# Patient Record
Sex: Female | Born: 1945 | Race: White | Hispanic: No | Smoking: Former smoker
Health system: Southern US, Community
[De-identification: ages and names within clinical notes are randomized; demographics above are authoritative.]

## PROBLEM LIST (undated history)

## (undated) DIAGNOSIS — Z9889 Other specified postprocedural states: Secondary | ICD-10-CM

## (undated) DIAGNOSIS — K648 Other hemorrhoids: Secondary | ICD-10-CM

## (undated) DIAGNOSIS — I493 Ventricular premature depolarization: Secondary | ICD-10-CM

## (undated) DIAGNOSIS — R112 Nausea with vomiting, unspecified: Secondary | ICD-10-CM

## (undated) DIAGNOSIS — H269 Unspecified cataract: Secondary | ICD-10-CM

## (undated) DIAGNOSIS — Z5189 Encounter for other specified aftercare: Secondary | ICD-10-CM

## (undated) DIAGNOSIS — I219 Acute myocardial infarction, unspecified: Secondary | ICD-10-CM

## (undated) DIAGNOSIS — I10 Essential (primary) hypertension: Secondary | ICD-10-CM

## (undated) DIAGNOSIS — Z8489 Family history of other specified conditions: Secondary | ICD-10-CM

## (undated) DIAGNOSIS — I2510 Atherosclerotic heart disease of native coronary artery without angina pectoris: Secondary | ICD-10-CM

## (undated) DIAGNOSIS — K5790 Diverticulosis of intestine, part unspecified, without perforation or abscess without bleeding: Secondary | ICD-10-CM

## (undated) DIAGNOSIS — R7611 Nonspecific reaction to tuberculin skin test without active tuberculosis: Secondary | ICD-10-CM

## (undated) DIAGNOSIS — K589 Irritable bowel syndrome without diarrhea: Secondary | ICD-10-CM

## (undated) DIAGNOSIS — K219 Gastro-esophageal reflux disease without esophagitis: Secondary | ICD-10-CM

## (undated) DIAGNOSIS — F419 Anxiety disorder, unspecified: Secondary | ICD-10-CM

## (undated) DIAGNOSIS — K644 Residual hemorrhoidal skin tags: Secondary | ICD-10-CM

## (undated) DIAGNOSIS — C50919 Malignant neoplasm of unspecified site of unspecified female breast: Secondary | ICD-10-CM

## (undated) DIAGNOSIS — D649 Anemia, unspecified: Secondary | ICD-10-CM

## (undated) DIAGNOSIS — A159 Respiratory tuberculosis unspecified: Secondary | ICD-10-CM

## (undated) DIAGNOSIS — K635 Polyp of colon: Secondary | ICD-10-CM

## (undated) DIAGNOSIS — M1990 Unspecified osteoarthritis, unspecified site: Secondary | ICD-10-CM

## (undated) HISTORY — DX: Anemia, unspecified: D64.9

## (undated) HISTORY — PX: OTHER SURGICAL HISTORY: SHX169

## (undated) HISTORY — DX: Malignant neoplasm of unspecified site of unspecified female breast: C50.919

## (undated) HISTORY — DX: Acute myocardial infarction, unspecified: I21.9

## (undated) HISTORY — DX: Ventricular premature depolarization: I49.3

## (undated) HISTORY — DX: Encounter for other specified aftercare: Z51.89

## (undated) HISTORY — DX: Residual hemorrhoidal skin tags: K64.4

## (undated) HISTORY — DX: Essential (primary) hypertension: I10

## (undated) HISTORY — DX: Atherosclerotic heart disease of native coronary artery without angina pectoris: I25.10

## (undated) HISTORY — PX: APPENDECTOMY: SHX54

## (undated) HISTORY — DX: Unspecified cataract: H26.9

## (undated) HISTORY — DX: Polyp of colon: K63.5

## (undated) HISTORY — DX: Anxiety disorder, unspecified: F41.9

## (undated) HISTORY — DX: Other hemorrhoids: K64.8

## (undated) HISTORY — DX: Diverticulosis of intestine, part unspecified, without perforation or abscess without bleeding: K57.90

## (undated) HISTORY — DX: Nonspecific reaction to tuberculin skin test without active tuberculosis: R76.11

---

## 1995-01-10 ENCOUNTER — Encounter: Payer: Self-pay | Admitting: Internal Medicine

## 1998-10-10 ENCOUNTER — Other Ambulatory Visit: Admission: RE | Admit: 1998-10-10 | Discharge: 1998-10-10 | Payer: Self-pay | Admitting: Obstetrics and Gynecology

## 1999-10-16 ENCOUNTER — Encounter (INDEPENDENT_AMBULATORY_CARE_PROVIDER_SITE_OTHER): Payer: Self-pay

## 1999-10-16 ENCOUNTER — Encounter: Payer: Self-pay | Admitting: Internal Medicine

## 1999-10-16 ENCOUNTER — Ambulatory Visit (HOSPITAL_COMMUNITY): Admission: RE | Admit: 1999-10-16 | Discharge: 1999-10-16 | Payer: Self-pay | Admitting: Gastroenterology

## 1999-10-16 DIAGNOSIS — K635 Polyp of colon: Secondary | ICD-10-CM

## 1999-10-16 HISTORY — DX: Polyp of colon: K63.5

## 1999-10-20 ENCOUNTER — Other Ambulatory Visit: Admission: RE | Admit: 1999-10-20 | Discharge: 1999-10-20 | Payer: Self-pay | Admitting: Obstetrics and Gynecology

## 2000-10-31 ENCOUNTER — Other Ambulatory Visit: Admission: RE | Admit: 2000-10-31 | Discharge: 2000-10-31 | Payer: Self-pay | Admitting: Obstetrics and Gynecology

## 2002-12-03 DIAGNOSIS — I219 Acute myocardial infarction, unspecified: Secondary | ICD-10-CM

## 2002-12-03 HISTORY — PX: CORONARY ANGIOPLASTY WITH STENT PLACEMENT: SHX49

## 2002-12-03 HISTORY — DX: Acute myocardial infarction, unspecified: I21.9

## 2003-07-25 ENCOUNTER — Encounter: Payer: Self-pay | Admitting: Emergency Medicine

## 2003-07-25 ENCOUNTER — Inpatient Hospital Stay (HOSPITAL_COMMUNITY): Admission: EM | Admit: 2003-07-25 | Discharge: 2003-07-28 | Payer: Self-pay | Admitting: Emergency Medicine

## 2003-08-09 ENCOUNTER — Encounter (HOSPITAL_COMMUNITY): Admission: RE | Admit: 2003-08-09 | Discharge: 2003-11-07 | Payer: Self-pay | Admitting: Cardiovascular Disease

## 2004-03-27 HISTORY — PX: US ECHOCARDIOGRAPHY: HXRAD669

## 2004-11-13 ENCOUNTER — Encounter: Payer: Self-pay | Admitting: Internal Medicine

## 2004-11-13 ENCOUNTER — Encounter (INDEPENDENT_AMBULATORY_CARE_PROVIDER_SITE_OTHER): Payer: Self-pay | Admitting: *Deleted

## 2004-11-13 ENCOUNTER — Ambulatory Visit (HOSPITAL_COMMUNITY): Admission: RE | Admit: 2004-11-13 | Discharge: 2004-11-13 | Payer: Self-pay | Admitting: Gastroenterology

## 2004-11-13 DIAGNOSIS — K644 Residual hemorrhoidal skin tags: Secondary | ICD-10-CM

## 2004-11-13 DIAGNOSIS — K5790 Diverticulosis of intestine, part unspecified, without perforation or abscess without bleeding: Secondary | ICD-10-CM

## 2004-11-13 HISTORY — DX: Residual hemorrhoidal skin tags: K64.4

## 2004-11-13 HISTORY — DX: Diverticulosis of intestine, part unspecified, without perforation or abscess without bleeding: K57.90

## 2005-05-21 ENCOUNTER — Ambulatory Visit (HOSPITAL_COMMUNITY): Admission: RE | Admit: 2005-05-21 | Discharge: 2005-05-21 | Payer: Self-pay | Admitting: General Surgery

## 2005-05-21 ENCOUNTER — Ambulatory Visit (HOSPITAL_BASED_OUTPATIENT_CLINIC_OR_DEPARTMENT_OTHER): Admission: RE | Admit: 2005-05-21 | Discharge: 2005-05-21 | Payer: Self-pay | Admitting: General Surgery

## 2005-05-21 ENCOUNTER — Encounter (INDEPENDENT_AMBULATORY_CARE_PROVIDER_SITE_OTHER): Payer: Self-pay | Admitting: *Deleted

## 2005-07-31 ENCOUNTER — Ambulatory Visit: Payer: Self-pay | Admitting: Internal Medicine

## 2005-08-02 ENCOUNTER — Ambulatory Visit: Payer: Self-pay | Admitting: Internal Medicine

## 2005-08-31 ENCOUNTER — Ambulatory Visit: Payer: Self-pay | Admitting: Internal Medicine

## 2005-09-03 ENCOUNTER — Ambulatory Visit: Payer: Self-pay | Admitting: Internal Medicine

## 2005-10-19 ENCOUNTER — Ambulatory Visit: Payer: Self-pay | Admitting: Internal Medicine

## 2005-12-10 ENCOUNTER — Other Ambulatory Visit: Admission: RE | Admit: 2005-12-10 | Discharge: 2005-12-10 | Payer: Self-pay | Admitting: Obstetrics and Gynecology

## 2006-07-01 HISTORY — PX: CARDIOVASCULAR STRESS TEST: SHX262

## 2006-08-28 ENCOUNTER — Ambulatory Visit: Payer: Self-pay | Admitting: Internal Medicine

## 2007-04-08 ENCOUNTER — Ambulatory Visit: Payer: Self-pay | Admitting: Pulmonary Disease

## 2007-04-18 ENCOUNTER — Other Ambulatory Visit: Admission: RE | Admit: 2007-04-18 | Discharge: 2007-04-18 | Payer: Self-pay | Admitting: Obstetrics and Gynecology

## 2007-04-25 ENCOUNTER — Encounter: Admission: RE | Admit: 2007-04-25 | Discharge: 2007-04-25 | Payer: Self-pay | Admitting: Obstetrics and Gynecology

## 2007-07-15 ENCOUNTER — Ambulatory Visit: Payer: Self-pay | Admitting: Internal Medicine

## 2007-12-11 ENCOUNTER — Ambulatory Visit: Payer: Self-pay | Admitting: Internal Medicine

## 2007-12-11 DIAGNOSIS — J309 Allergic rhinitis, unspecified: Secondary | ICD-10-CM | POA: Insufficient documentation

## 2007-12-11 DIAGNOSIS — J45909 Unspecified asthma, uncomplicated: Secondary | ICD-10-CM | POA: Insufficient documentation

## 2007-12-11 DIAGNOSIS — J329 Chronic sinusitis, unspecified: Secondary | ICD-10-CM | POA: Insufficient documentation

## 2007-12-11 DIAGNOSIS — I2510 Atherosclerotic heart disease of native coronary artery without angina pectoris: Secondary | ICD-10-CM | POA: Insufficient documentation

## 2008-02-11 ENCOUNTER — Ambulatory Visit: Payer: Self-pay | Admitting: Internal Medicine

## 2008-02-17 ENCOUNTER — Ambulatory Visit: Payer: Self-pay | Admitting: Internal Medicine

## 2008-04-20 ENCOUNTER — Other Ambulatory Visit: Admission: RE | Admit: 2008-04-20 | Discharge: 2008-04-20 | Payer: Self-pay | Admitting: Obstetrics and Gynecology

## 2008-07-13 ENCOUNTER — Ambulatory Visit: Payer: Self-pay | Admitting: Internal Medicine

## 2009-04-01 ENCOUNTER — Ambulatory Visit: Payer: Self-pay | Admitting: Internal Medicine

## 2009-07-12 ENCOUNTER — Ambulatory Visit: Payer: Self-pay | Admitting: Internal Medicine

## 2009-10-13 ENCOUNTER — Ambulatory Visit: Payer: Self-pay | Admitting: Internal Medicine

## 2009-10-13 ENCOUNTER — Encounter (INDEPENDENT_AMBULATORY_CARE_PROVIDER_SITE_OTHER): Payer: Self-pay | Admitting: *Deleted

## 2009-10-13 LAB — CONVERTED CEMR LAB
Basophils Absolute: 0 10*3/uL (ref 0.0–0.1)
Basophils Relative: 0.1 % (ref 0.0–3.0)
Eosinophils Absolute: 0.1 10*3/uL (ref 0.0–0.7)
Eosinophils Relative: 2.2 % (ref 0.0–5.0)
HCT: 44.2 % (ref 36.0–46.0)
Hemoglobin: 15 g/dL (ref 12.0–15.0)
Lymphocytes Relative: 16 % (ref 12.0–46.0)
Lymphs Abs: 1 10*3/uL (ref 0.7–4.0)
MCHC: 33.9 g/dL (ref 30.0–36.0)
MCV: 97.7 fL (ref 78.0–100.0)
Monocytes Absolute: 0.5 10*3/uL (ref 0.1–1.0)
Monocytes Relative: 8.4 % (ref 3.0–12.0)
Neutro Abs: 4.9 10*3/uL (ref 1.4–7.7)
Neutrophils Relative %: 73.3 % (ref 43.0–77.0)
Platelets: 226 10*3/uL (ref 150.0–400.0)
RBC: 4.52 M/uL (ref 3.87–5.11)
RDW: 12.7 % (ref 11.5–14.6)
WBC: 6.5 10*3/uL (ref 4.5–10.5)

## 2009-10-19 LAB — CONVERTED CEMR LAB: IgE (Immunoglobulin E), Serum: 6.9 intl units/mL (ref 0.0–180.0)

## 2009-11-28 DIAGNOSIS — K644 Residual hemorrhoidal skin tags: Secondary | ICD-10-CM | POA: Insufficient documentation

## 2009-11-28 DIAGNOSIS — Z860100 Personal history of colon polyps, unspecified: Secondary | ICD-10-CM | POA: Insufficient documentation

## 2009-11-28 DIAGNOSIS — K5730 Diverticulosis of large intestine without perforation or abscess without bleeding: Secondary | ICD-10-CM | POA: Insufficient documentation

## 2009-12-05 ENCOUNTER — Ambulatory Visit: Payer: Self-pay | Admitting: Internal Medicine

## 2009-12-05 DIAGNOSIS — K5900 Constipation, unspecified: Secondary | ICD-10-CM | POA: Insufficient documentation

## 2009-12-12 ENCOUNTER — Ambulatory Visit (HOSPITAL_COMMUNITY): Admission: RE | Admit: 2009-12-12 | Discharge: 2009-12-12 | Payer: Self-pay | Admitting: Internal Medicine

## 2010-02-28 ENCOUNTER — Emergency Department (HOSPITAL_COMMUNITY): Admission: EM | Admit: 2010-02-28 | Discharge: 2010-02-28 | Payer: Self-pay | Admitting: Emergency Medicine

## 2010-03-03 ENCOUNTER — Ambulatory Visit: Payer: Self-pay | Admitting: Internal Medicine

## 2010-03-30 ENCOUNTER — Ambulatory Visit: Payer: Self-pay | Admitting: Internal Medicine

## 2010-04-04 DIAGNOSIS — I10 Essential (primary) hypertension: Secondary | ICD-10-CM | POA: Insufficient documentation

## 2011-01-04 NOTE — Medication Information (Signed)
Summary: [employer] Forms  [facility] Forms   Imported By: Lennie Odor 03/17/2010 14:30:32  _____________________________________________________________________  External Attachment:    Type:   Image     Comment:   External Document

## 2011-01-04 NOTE — Assessment & Plan Note (Signed)
Summary: Acute NP office visit - asthma   Primary Provider/Referring Provider:  Selena Batten, MD  CC:  increased SOB, wheezing, and dry cough x2weeks - denies f/c/s.  History of Present Illness: April 01, 2009--Presents for an acute office visit. C/o asthma flare: wheezing, increased SOB, dry cough x4days - Has c/o loose stools, increased frequency. Wheezing has worsened last 2 days, no purulent sputum, fever. .Only taking pulmicort 1 puff once daily Denies chest pain,  orthopnea, hemoptysis, fever, n/v/d, edema, headache,recent travel or antibiotics.   07/12/09- Allergic rhinitis, asthma, recurrent rhinosinusitis Got over the flare for which she was here in April. She doubles pulmicort to 2 puffs two times a day when she feels worse. Now feels some nasal stuffiness and wheezy. Advair caused tachycardia. Singulair ineffective.   October 13, 2009- Allergic rhinitis, asthma, recurrentsinusitis Increased asthma over last 2 weeks without specific trigger except working outdoors in her garden. She finally tried Spiriva and found it helpful- giving her 6 hours relief. Prone to palpitation. She continues Pulmicort two times a day. Needs prednisone taper about twice a year. We discussed steroid side effects. Had flu vax.  March 03, 2010- Allergic rhinitis, asthma, recurrent sinusitis Having trouble financing meds. We are helping with some forms. She found she can't reduce Pulmicort below 1 puff two times a day. Uses Spiriva in runs when needed. She has had some gastritis, blamed on bone density meds.  March 30, 2010 --Prresnts for acute office visit Compalins of increased SOB, wheezing, dry cough x2weeks. Dry cough started few weeks ago, worse last week. She has some intermittent wheezing esp at night. She was started on Lisinopril 1 month ago. Denies chest pain, dyspnea, orthopnea, hemoptysis, fever, n/v/d, edema, headache, discolored mucus. OTC not helping.   Medications Prior to Update: 1)  Bayer  Aspirin 81mg   Tabs (Aspirin) .... Take 1 By Mouth Once Daily 2)  Crestor 10 Mg  Tabs (Rosuvastatin Calcium) .... Take 1 By Mouth Once Daily 3)  Calcium 600 1500 Mg  Tabs (Calcium Carbonate) .... Take 2  Tablets Once Daily 4)  Bl Vitamin C 500 Mg  Tabs (Ascorbic Acid) .... Take 1 By Mouth Once Daily 5)  Plavix 75 Mg  Tabs (Clopidogrel Bisulfate) .... Take 1 By Mouth Once Daily 6)  Centrum Silver   Tabs (Multiple Vitamins-Minerals) .... Take 1 By Mouth Once Daily 7)  Fluticasone Propionate 50 Mcg/act Susp (Fluticasone Propionate) .Marland Kitchen.. 1-2 Sprays in Each Nostril As Needed 8)  Diovan 160 Mg Tabs (Valsartan) .... Take 1 By Mouth Once Daily 9)  Claritin 10 Mg  Tabs (Loratadine) .... Take 1 Tablet By Mouth Once A Day As Needed 10)  Pulmicort Flexhaler 180 Mcg/act Aepb (Budesonide) .Marland Kitchen.. 1 Puff Two Times A Day Rinse Mouth Well After Use 11)  Proair Hfa 108 (90 Base) Mcg/act Aers (Albuterol Sulfate) .... 2 Puffs Four Times A Day As Needed 12)  Spiriva Handihaler 18 Mcg Caps (Tiotropium Bromide Monohydrate) .Marland Kitchen.. 1 Daily 13)  Levsin/sl 0.125 Mg Subl (Hyoscyamine Sulfate) .... Dissolve 1 Tablet Under Tongue As Needed For Cramps  Current Medications (verified): 1)  Bayer Aspirin 81mg   Tabs (Aspirin) .... Take 1 By Mouth Once Daily 2)  Simvastatin 40 Mg Tabs (Simvastatin) .... Take 1 Tab By Mouth At Bedtime 3)  Calcium 600 1500 Mg  Tabs (Calcium Carbonate) .... Take 2  Tablets Once Daily 4)  Bl Vitamin C 500 Mg  Tabs (Ascorbic Acid) .... Take 1 By Mouth Once Daily 5)  Plavix 75  Mg  Tabs (Clopidogrel Bisulfate) .... Take 1 By Mouth Once Daily 6)  Centrum Silver   Tabs (Multiple Vitamins-Minerals) .... Take 1 By Mouth Once Daily 7)  Fluticasone Propionate 50 Mcg/act Susp (Fluticasone Propionate) .Marland Kitchen.. 1-2 Sprays in Each Nostril As Needed 8)  Lisinopril 20 Mg Tabs (Lisinopril) .... Take 1 Tab By Mouth At Bedtime 9)  Claritin 10 Mg  Tabs (Loratadine) .... Take 1 Tablet By Mouth Once A Day As Needed 10)   Pulmicort Flexhaler 180 Mcg/act Aepb (Budesonide) .Marland Kitchen.. 1 Puff Two Times A Day Rinse Mouth Well After Use 11)  Proair Hfa 108 (90 Base) Mcg/act Aers (Albuterol Sulfate) .... 2 Puffs Four Times A Day As Needed 12)  Spiriva Handihaler 18 Mcg Caps (Tiotropium Bromide Monohydrate) .Marland Kitchen.. 1 Daily 13)  Levsin/sl 0.125 Mg Subl (Hyoscyamine Sulfate) .... 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 Anxiety Disorder Breast Cancer-bilateral Colon polyps  Hypertension  Past Surgical History: Last updated: 12/23/09 Bilateral mastectomy Appendectomy Stent 2004 Hysterectomy  Family History: Last updated: 2009-12-23 daughter asthma mother died breast ca age 56 father died korean war age 34 Sibling 1- living age 47; Spina Bifida Sibling 2- living age 26 Sibling 3- living age 76; cancer breast cancer Family History of Heart Disease: maternal grandmother Maternal Aunt Hodgkins No FH of Colon Cancer:  Social History: 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  Patient states former smoker.  Quit 40 years ago. Smoked for 2 years. ETOH- 2 drinks per week Caffeine- 2 cups of tea daily Exercise-5 times weekly, gardens. Divorced with 3 children. Illicit Drug Use - no  Risk Factors: Smoking Status: quit (07/19/2008) Passive Smoke Exposure: yes (07/19/2008)  Review of Systems      See HPI  Vital Signs:  Patient profile:   65 year old female Height:      62 inches Weight:      109.38 pounds BMI:     20.08 O2 Sat:      98 % on Room air Temp:     97.3 degrees F oral Pulse rate:   66 / minute BP sitting:   132 / 80  (left arm) Cuff size:   regular  Vitals Entered By: Randell Loop CMA (March 30, 2010 4:16 PM)  O2 Flow:   Room air CC: increased SOB, wheezing, dry cough x2weeks - denies f/c/s Is Patient Diabetic? No Comments Medications reviewed with patient.   Physical Exam  Additional Exam:  General: A/Ox3; pleasant and cooperative, NAD, thin SKIN: no rash, lesions NODES: no lymphadenopathy NECK: Supple w/ fair ROM, JVD- none, normal carotid impulses w/o bruits Thyroid-  CHEST: coarse breath sounds ,mild wheeze HEART: RRR, no m/g/r heard ABDOMEN: Soft and nl; EAV:WUJW, nl pulses, no edema  NEURO: Grossly intact to observation HEENT: [place]/AT,  Conjunctivae- clear,  TMs- wnl, Nose- clea, narrowr, Throat- clear and wnl     Impression & Recommendations:  Problem # 1:  ASTHMA (ICD-493.90) Flare : w/ associated cough suspect this is combination of allergy flaare along w/ addition of ACE  REC:   Saline nasal rinses as needed  Clariitin once daily  Fluticasone 2 puffs two times a day  Stop Lisinopril .  Restart Diovan once daily  Delysm 2 tsp two times a day as needed cough  follow up 2  weeks and as needed.  Please contact office for sooner follow up if symptoms do not improve or worsen     Medications Added to Medication List This Visit: 1)  Simvastatin 40 Mg Tabs (Simvastatin) .... Take 1 tab by mouth at bedtime 2)  Lisinopril 20 Mg Tabs (Lisinopril) .... Take 1 tab by mouth at bedtime  Complete Medication List: 1)  Bayer Aspirin 81mg  Tabs (aspirin)  .... Take 1 by mouth once daily 2)  Simvastatin 40 Mg Tabs (Simvastatin) .... Take 1 tab by mouth at bedtime 3)  Calcium 600 1500 Mg Tabs (Calcium carbonate) .... Take 2  tablets once daily 4)  Bl Vitamin C 500 Mg Tabs (Ascorbic acid) .... Take 1 by mouth once daily 5)  Plavix 75 Mg Tabs (Clopidogrel bisulfate) .... Take 1 by mouth once daily 6)  Centrum Silver Tabs (Multiple vitamins-minerals) .... Take 1 by mouth once daily 7)  Fluticasone Propionate 50 Mcg/act Susp (Fluticasone propionate) .Marland Kitchen.. 1-2 sprays in each nostril as needed 8)   Lisinopril 20 Mg Tabs (Lisinopril) .... Take 1 tab by mouth at bedtime 9)  Claritin 10 Mg Tabs (Loratadine) .... Take 1 tablet by mouth once a day as needed 10)  Pulmicort Flexhaler 180 Mcg/act Aepb (Budesonide) .Marland Kitchen.. 1 puff two times a day rinse mouth well after use 11)  Proair Hfa 108 (90 Base) Mcg/act Aers (Albuterol sulfate) .... 2 puffs four times a day as needed 12)  Spiriva Handihaler 18 Mcg Caps (Tiotropium bromide monohydrate) .Marland Kitchen.. 1 daily 13)  Levsin/sl 0.125 Mg Subl (Hyoscyamine sulfate) .... Dissolve 1 tablet under tongue as needed for cramps  Other Orders: Est. Patient Level IV (04540)  Patient Instructions: 1)  Saline nasal rinses as needed  2)  Clariitin once daily  3)  Fluticasone 2 puffs two times a day  4)  Stop Lisinopril .  5)  Restart Diovan once daily  6)  Delysm 2 tsp two times a day as needed cough  7)  follow up 2 weeks and as needed.  8)  Please contact office for sooner follow up if symptoms do not improve or worsen  9)      Immunization History:  Influenza Immunization History:    Influenza:  historical (09/02/2009)  Pneumovax Immunization History:    Pneumovax:  historical (09/02/2008)

## 2011-01-04 NOTE — Medication Information (Signed)
Summary: Assist program forms/[employer]  Assist program forms/[employer]   Imported By: Sherian Rein 03/15/2010 08:16:59  _____________________________________________________________________  External Attachment:    Type:   Image     Comment:   External Document

## 2011-01-04 NOTE — Assessment & Plan Note (Signed)
Summary: CONS COLON PT ON PLAVIX   History of Present Illness Visit Type: Initial Visit Primary GI MD: [name] MD Primary Provider: Selena Batten, MD Chief Complaint: Patient here for consult for colonoscopy she is having some change in her bowels that are not new but increased since last Feb. Her bowels change from diarrhea to constipation, she denies any blood in her stool. She denies any abdominal pain now but has had some in the past.  History of Present Illness:   Ms. [name] [name] is a 65 year old white female former patient of Dr Ewing Schlein who has asked to transfer her care to [place]. She had a colonoscopy by Dr Ewing Schlein in December 2005 which showed both internal and external hemorrhoids as well as left sided diverticula and a tortuous looped colon. Neither the pediatric colonoscope nor the upper endoscope could get past the mid transverse colon or possibly the hepatic flexure. Therefore, a barium enema was subsequently completed showing once again, a markedly tortuous colon, particularly involving the transverse and right colon as well as diffuse diverticulosis. There was no evidence of obstruction or definite polypoid defects, although there were retained feces, particularly in the right colon. Patient has a history of adenomatous polyps seen on a previous colonoscopy done in 2000. she had a hyperplastic polyp with last colonoscopy in 2005. She comes today to discuss having a recall colonoscopy on Plavix. She takes Plavix for  coronary artery disease for which she is status post stent placement in 2004. She is unable to tolerate high-fiber foods because of crampy abdominal pain. Her stools are very soft but difficult to evacuate; she denies taking any laxatives.   GI Review of Systems      Denies abdominal pain, acid reflux, belching, bloating, chest pain, dysphagia with liquids, dysphagia with solids, heartburn, loss of appetite, nausea, vomiting, vomiting blood, weight loss, and  weight gain.     Reports change in bowel habits, constipation, diarrhea, hemorrhoids, and  irritable bowel syndrome.     Denies anal fissure, black tarry stools, diverticulosis, fecal incontinence, heme positive stool, jaundice, light color stool, liver problems, rectal bleeding, and  rectal pain. Preventive Screening-Counseling & Management      Drug Use:  no.      Current Medications (verified): 1)  Bayer Aspirin 81mg   Tabs (Aspirin) .... Take 1 By Mouth Once Daily 2)  Crestor 10 Mg  Tabs (Rosuvastatin Calcium) .... Take 1 By Mouth Once Daily 3)  Calcium 600 1500 Mg  Tabs (Calcium Carbonate) .... Take 2  Tablets Once Daily 4)  Bl Vitamin C 500 Mg  Tabs (Ascorbic Acid) .... Take 1 By Mouth Once Daily 5)  Plavix 75 Mg  Tabs (Clopidogrel Bisulfate) .... Take 1 By Mouth Once Daily 6)  Centrum Silver   Tabs (Multiple Vitamins-Minerals) .... Take 1 By Mouth Once Daily 7)  Fluticasone Propionate 50 Mcg/act Susp (Fluticasone Propionate) .Marland Kitchen.. 1-2 Sprays in Each Nostril As Needed 8)  Diovan 160 Mg Tabs (Valsartan) .... Take 1 By Mouth Once Daily 9)  Claritin 10 Mg  Tabs (Loratadine) .... Take 1 Tablet By Mouth Once A Day As Needed 10)  Pulmicort Flexhaler 180 Mcg/act Aepb (Budesonide) .Marland Kitchen.. 1 Puff Two Times A Day Rinse Mouth Well After Use 11)  Proair Hfa 108 (90 Base) Mcg/act Aers (Albuterol Sulfate) .... 2 Puffs Four Times A Day As Needed 12)  Spiriva Handihaler 18 Mcg Caps (Tiotropium Bromide Monohydrate) .Marland Kitchen.. 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  Patient states former smoker.  Quit 40 years ago. Smoked for 2 years. ETOH- 2 drinks per week Caffeine- 2 cups of tea daily Exercise-5 times weekly, gardens. Divorced with 3 children. Illicit Drug Use - no Drug Use:  no  Review of Systems  The patient denies allergy/sinus, anemia, anxiety-new, arthritis/joint pain, back pain, blood in urine, breast changes/lumps, change in vision, confusion, cough, coughing up blood, depression-new, fainting, fatigue, fever, headaches-new, hearing problems, heart murmur, heart rhythm changes, itching, menstrual pain, muscle pains/cramps, night sweats, nosebleeds, pregnancy symptoms, shortness of breath, skin rash, sleeping problems, sore throat, swelling of feet/legs, swollen lymph glands, thirst - excessive , urination - excessive , urination changes/pain, urine leakage, vision changes, and voice change.         Pertinent positive and negative review of systems were noted in the above HPI. All other ROS was otherwise negative.   Vital Signs:  Patient profile:   65 year old female Height:      62 inches Weight:      111.6 pounds BMI:     20.49 Pulse rate:   64 / minute Pulse rhythm:   regular BP sitting:   122 / 78  (right arm) Cuff size:   regular  Vitals Entered By: Harlow Mares CMA Duncan Dull) (December 05, 2009 2:01 PM)  Physical Exam  General:  Well developed, well nourished, no acute distress. Eyes:  PERRLA, no icterus. Mouth:  No deformity or lesions, dentition normal. Neck:  Supple; no masses or thyromegaly. Breasts:  status post bilateral mastectomy. Lungs:  Clear throughout to auscultation. Heart:  Regular rate and rhythm; no murmurs, rubs,  or bruits. Abdomen:  soft nontender abdomen with  normoactive bowel sounds. No palpable stool or mass. Good abdominal muscles. Rectal:  normal rectal tone. Empty rectal ampulla with soft Hemoccult-negative stool. Extremities:  No clubbing, cyanosis, edema or deformities noted. Skin:  Intact without significant lesions or rashes. Psych:  Alert and cooperative. Normal mood and affect.   Impression & Recommendations:  Problem # 1:  CONSTIPATION (ICD-564.00) Patient has a redundant torturous colon demonstrated on a failed colonoscopy in 2005 and a subsequent barium enema. Patient's symptoms are likely related to her redundant colon. I would like to do a Sitzmarks study for transit time evaluation.  Problem # 2:  COLONIC POLYPS, HYPERPLASTIC, HX OF (ICD-V12.72) Patient has a history of hyperplastic polyp in 2000. She is not due for a repeat colonoscopy until 01/2011. At that time, we will decide whether colonoscopy or virtual colonoscopy would be preferrable  in light of her being on Plavix for a drug-eluting stent.  Other Orders: KUB for SITZ Marks (KUB for SITZ)  Patient Instructions: 1)  medium fiber content diet. 2)  Consider small doses of MiraLax 9 g 2-3 times a week to improve the transit time. 3)  Sitz marks study. 4)  Return in 2 years for colonoscopy versus virtual colonoscopy. 5)  Copy sent to : Dr Gweneth Dimitri Prescriptions: LEVSIN/SL 0.125 MG SUBL (HYOSCYAMINE SULFATE) Dissolve 1 tablet under tongue as needed for cramps  #30 x 1   Entered by:   Hortense Ramal CMA (AAMA)   Authorized by:   Hart Carwin MD   Signed by:   Hortense Ramal CMA (AAMA) on 12/05/2009   Method used:   Electronically to        [facility] (retail)       [address]       [place], [place]  [id number]       Ph: [phone] or [phone]       Fax: [phone]   RxID:   [id number]

## 2011-01-04 NOTE — Assessment & Plan Note (Signed)
Summary: rov//mbw   Primary Provider/Referring Provider:  Selena Batten, MD  CC:  Follow up visit-wheezing lately-just slightly.Marland Kitchen  History of Present Illness: April 01, 2009--Presents for an acute office visit. C/o asthma flare: wheezing, increased SOB, dry cough x4days - Has c/o loose stools, increased frequency. Wheezing has worsened last 2 days, no purulent sputum, fever. .Only taking pulmicort 1 puff once daily Denies chest pain,  orthopnea, hemoptysis, fever, n/v/d, edema, headache,recent travel or antibiotics.   07/12/09- Allergic rhinitis, asthma, recurrent rhinosinusitis Got over the flare for which she was here in April. She doubles pulmicort to 2 puffs two times a day when she feels worse. Now feels some nasal stuffiness and wheezy. Advair caused tachycardia. Singulair ineffective.   October 13, 2009- Allergic rhinitis, asthma, recurrentsinusitis Increased asthma over last 2 weeks without specific trigger except working outdoors in her garden. She finally tried Spiriva and found it helpful- giving her 6 hours relief. Prone to palpitation. She continues Pulmicort two times a day. Needs prednisone taper about twice a year. We discussed steroid side effects. Had flu vax.  March 03, 2010- Allergic rhinitis, asthma, recurrent sinusitis Having trouble financing meds. We are helping with some forms. She found she can't reduce Pulmicort below 1 puff two times a day. Uses Spiriva in runs when needed. She has had some gastritis, blamed on bone density meds.     Current Medications (verified): 1)  Bayer Aspirin 81mg   Tabs (Aspirin) .... Take 1 By Mouth Once Daily 2)  Crestor 10 Mg  Tabs (Rosuvastatin Calcium) .... Take 1 By Mouth Once Daily 3)  Calcium 600 1500 Mg  Tabs (Calcium Carbonate) .... Take 2  Tablets Once Daily 4)  Bl Vitamin C 500 Mg  Tabs (Ascorbic Acid) .... Take 1 By Mouth Once Daily 5)  Plavix 75 Mg  Tabs (Clopidogrel Bisulfate) .... Take 1 By Mouth Once Daily 6)  Centrum  Silver   Tabs (Multiple Vitamins-Minerals) .... Take 1 By Mouth Once Daily 7)  Fluticasone Propionate 50 Mcg/act Susp (Fluticasone Propionate) .Marland Kitchen.. 1-2 Sprays in Each Nostril As Needed 8)  Diovan 160 Mg Tabs (Valsartan) .... Take 1 By Mouth Once Daily 9)  Claritin 10 Mg  Tabs (Loratadine) .... Take 1 Tablet By Mouth Once A Day As Needed 10)  Pulmicort Flexhaler 180 Mcg/act Aepb (Budesonide) .Marland Kitchen.. 1 Puff Two Times A Day Rinse Mouth Well After Use 11)  Proair Hfa 108 (90 Base) Mcg/act Aers (Albuterol Sulfate) .... 2 Puffs Four Times A Day As Needed 12)  Spiriva Handihaler 18 Mcg Caps (Tiotropium Bromide Monohydrate) .Marland Kitchen.. 1 Daily 13)  Levsin/sl 0.125 Mg Subl (Hyoscyamine Sulfate) .... 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 Anxiety Disorder Breast Cancer-bilateral Colon polyps  Hypertension  Past Surgical History: Last updated: 12/11/2009 Bilateral mastectomy Appendectomy Stent 2004 Hysterectomy  Family History: Last updated: 12/11/09 daughter asthma mother died breast ca age 32 father died korean war age 61 Sibling 1- living age 22; Spina Bifida Sibling 2- living age 49 Sibling 3- living age 65; cancer breast cancer Family History of Heart Disease: maternal grandmother Maternal Aunt Hodgkins No FH of Colon Cancer:  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  Patient states former smoker.  Quit 40 years ago. Smoked for 2 years. ETOH- 2 drinks per week  Caffeine- 2 cups of tea daily Exercise-5 times weekly, gardens. Divorced with 3 children. Illicit Drug Use - no  Risk Factors: Smoking Status: quit (07/19/2008) Passive Smoke Exposure: yes (07/19/2008)  Review of Systems      See  HPI       The patient complains of dyspnea on exertion.  The patient denies anorexia, fever, weight loss, weight gain, vision loss, decreased hearing, hoarseness, chest pain, syncope, peripheral edema, prolonged cough, headaches, hemoptysis, and severe indigestion/heartburn.    Vital Signs:  Patient profile:   64 year old female Height:      62 inches Weight:      113 pounds BMI:     20.74 O2 Sat:      100 % on Room air Pulse rate:   59 / minute BP sitting:   112 / 72  (left arm) Cuff size:   regular  Vitals Entered By: Reynaldo Minium CMA (March 03, 2010 11:03 AM)  O2 Flow:  Room air  Physical Exam  Additional Exam:  General: A/Ox3; pleasant and cooperative, NAD, thin SKIN: no rash, lesions NODES: no lymphadenopathy NECK: Supple w/ fair ROM, JVD- none, normal carotid impulses w/o bruits Thyroid-  CHEST: coarse breath sounds ,mild wheeze HEART: RRR, no m/g/r heard ABDOMEN: Soft and nl; WGN:FAOZ, nl pulses, no edema  NEURO: Grossly intact to observation HEENT: /AT,  Conjunctivae- clear,  TMs- wnl, Nose- clea, narrowr, Throat- clear and wnl     Impression & Recommendations:  Problem # 1:  ASTHMA (ICD-493.90) Chronic asthma. Med costs are a problem. We discussed low dose steroid maintenance but she wants to wait due to sore stomach.  Problem # 2:  ALLERGIC RHINITIS (ICD-477.9)  Fluticasone spray helps Her updated medication list for this problem includes:    Fluticasone Propionate 50 Mcg/act Susp (Fluticasone propionate) .Marland Kitchen... 1-2 sprays in each nostril as needed    Claritin 10 Mg Tabs (Loratadine) .Marland Kitchen... Take 1 tablet by mouth once a day as needed  Other Orders: Est. Patient Level II (30865) Nebulizer Tx (78469)  Patient Instructions: 1)  Please schedule a follow-up appointment in 6 months, earlier as needed. 2)  Samples and script Pulmicort 180, 1 puff and rinse twice daily. 3)  Check with your insurance to see if they have preferred drugs on formulary that we  could substitute cheaper. 4)  We will try to help with pharmacy assistance where available for some meds. 5)    Prescriptions: PULMICORT FLEXHALER 180 MCG/ACT AEPB (BUDESONIDE) 1 puff two times a day RINSE MOUTH WELL AFTER USE  #1 x prn   Entered and Authorized by:   Waymon Budge MD   Signed by:   Waymon Budge MD on 03/03/2010   Method used:   Print then Give to Patient   RxID:   [id number]

## 2011-01-04 NOTE — Medication Information (Signed)
Summary: Assist program forms/[facility]  Assist program forms/[facility]   Imported By: Sherian Rein 03/15/2010 08:18:34  _____________________________________________________________________  External Attachment:    Type:   Image     Comment:   External Document

## 2011-01-17 ENCOUNTER — Encounter: Payer: Self-pay | Admitting: Internal Medicine

## 2011-01-24 NOTE — Letter (Signed)
Summary: Colonoscopy Letter  [facility]  [address] [place], [place] [address]   Phone: [phone]  Fax: [phone]      January 17, 2011 MRN: [id number]   [facility] [address] [place], [place]  [address]   Dear [name] [name],   According to your medical record, it is time for you to schedule a Colonoscopy. The American Cancer Society recommends this procedure as a method to detect early colon cancer. Patients with a family history of colon cancer, or a personal history of colon polyps or inflammatory bowel disease are at increased risk.  This letter has been generated based on the recommendations made at the time of your procedure. If you feel that in your particular situation this may no longer apply, please contact our office.  Please call our office at [phone] to schedule this appointment or to update your records at your earliest convenience.  Thank you for cooperating with [place] to provide you with the very best care possible.   Sincerely,  Hedwig Morton. Juanda Chance, M.D.  [facility] [phone]

## 2011-02-20 NOTE — Op Note (Signed)
Summary: Colon (Dr Ewing Schlein)  NAME:  [name] [name]              ACCOUNT NO.:  [id number]   MEDICAL RECORD NO.:  [id number]          PATIENT TYPE:  AMB   LOCATION:  ENDO                         FACILITY:  [facility]   PHYSICIAN:  Petra Kuba, M.D.    DATE OF BIRTH:  1946/02/13   DATE OF PROCEDURE:  11/13/2004  DATE OF DISCHARGE:                                 OPERATIVE REPORT   PROCEDURE:  Colonoscopy.   INDICATIONS:  Screening.  Consent was signed after risks, benefits, methods,  and options were thoroughly discussed in the office in the past.   PREMEDICATIONS:  Demerol 70 mg, Versed 8 mg.   DESCRIPTION OF PROCEDURE:  Rectal inspection pertinent for external  hemorrhoids.  Digital exam was negative.  Video pediatric adjustable  colonoscope was inserted and due to a tortuous sigmoid, was able to be  advanced to the proximal splenic flexure.  Unfortunately at that point the  scope began to loop and with rolling her on her back and rolling her on her  right side, rolling her on her back, and then finally back to her left side,  we might have been able to get to hepatic flexure.  Unfortunately, we could  not advance any further and we elected to withdraw.  Other than the left-  sided diverticula, no abnormalities were seen.  We went ahead and inserted  the upper endoscope and could get to the mid transverse probably, but again  despite all three positions and various abdominal pressures, due to unable  to control the looping, we could not advance any further.  We elected to  withdraw.  Again on insertion and withdrawal, no additional findings were  seen.  Anorectal pull-through and retroflexion confirmed the small  hemorrhoids.  The scope was removed.  The patient tolerated the procedure  well.  There were no obvious immediate complications.   ENDOSCOPIC DIAGNOSES:  1.  Internal and external hemorrhoids.  2.  Left-sided diverticula.  3.  Tortuous looping colon.  Unable to advance  either the pediatric      adjustable or the upper endoscope past either the mid transverse or      possibly even the hepatic flexure.   PLAN:  Go ahead and get an air contrast barium enema.  Happy to see back  p.r.n.  When repeat screening is needed, consider virtual colonoscopy if  widely available at that juncture.     MEM/MEDQ  D:  11/13/2004  T:  11/13/2004  Job:  [id number]   cc:   Sharlot Gowda, M.D.  [address]  [place], [place] [address]  Fax: [phone]

## 2011-02-23 LAB — DIFFERENTIAL
Basophils Absolute: 0 10*3/uL (ref 0.0–0.1)
Basophils Relative: 0 % (ref 0–1)
Eosinophils Absolute: 0.1 10*3/uL (ref 0.0–0.7)
Eosinophils Relative: 3 % (ref 0–5)
Lymphocytes Relative: 17 % (ref 12–46)
Lymphs Abs: 1 10*3/uL (ref 0.7–4.0)
Monocytes Absolute: 0.4 10*3/uL (ref 0.1–1.0)
Monocytes Relative: 8 % (ref 3–12)
Neutro Abs: 4.1 10*3/uL (ref 1.7–7.7)
Neutrophils Relative %: 72 % (ref 43–77)

## 2011-02-23 LAB — POCT CARDIAC MARKERS
CKMB, poc: 1.3 ng/mL (ref 1.0–8.0)
CKMB, poc: <1 ng/mL — ABNORMAL LOW (ref 1.0–8.0)
Myoglobin, poc: 31.2 ng/mL (ref 12–200)
Myoglobin, poc: 53.4 ng/mL (ref 12–200)
Troponin i, poc: <0.05 ng/mL (ref 0.00–0.09)
Troponin i, poc: <0.05 ng/mL (ref 0.00–0.09)

## 2011-02-23 LAB — POCT I-STAT, CHEM 8
BUN: 13 mg/dL (ref 6–23)
Calcium, Ion: 1.09 mmol/L — ABNORMAL LOW (ref 1.12–1.32)
Chloride: 104 mEq/L (ref 96–112)
Creatinine, Ser: 0.7 mg/dL (ref 0.4–1.2)
Glucose, Bld: 120 mg/dL — ABNORMAL HIGH (ref 70–99)
HCT: 41 % (ref 36.0–46.0)
Hemoglobin: 13.9 g/dL (ref 12.0–15.0)
Potassium: 3.5 mEq/L (ref 3.5–5.1)
Sodium: 142 mEq/L (ref 135–145)
TCO2: 29 mmol/L (ref 0–100)

## 2011-02-23 LAB — PROTIME-INR
INR: 1.08 (ref 0.00–1.49)
Prothrombin Time: 13.9 seconds (ref 11.6–15.2)

## 2011-02-23 LAB — CBC
HCT: 39.8 % (ref 36.0–46.0)
Hemoglobin: 13.6 g/dL (ref 12.0–15.0)
MCHC: 34.1 g/dL (ref 30.0–36.0)
MCV: 95.6 fL (ref 78.0–100.0)
Platelets: 209 10*3/uL (ref 150–400)
RBC: 4.17 MIL/uL (ref 3.87–5.11)
RDW: 13.3 % (ref 11.5–15.5)
WBC: 5.7 10*3/uL (ref 4.0–10.5)

## 2011-02-23 LAB — D-DIMER, QUANTITATIVE: D-Dimer, Quant: <0.22 ug/mL-FEU (ref 0.00–0.48)

## 2011-02-28 ENCOUNTER — Encounter: Payer: Self-pay | Admitting: Internal Medicine

## 2011-02-28 ENCOUNTER — Ambulatory Visit (INDEPENDENT_AMBULATORY_CARE_PROVIDER_SITE_OTHER): Payer: Self-pay | Admitting: Internal Medicine

## 2011-02-28 DIAGNOSIS — Z860100 Personal history of colon polyps, unspecified: Secondary | ICD-10-CM

## 2011-02-28 DIAGNOSIS — D689 Coagulation defect, unspecified: Secondary | ICD-10-CM

## 2011-02-28 MED ORDER — PEG-KCL-NACL-NASULF-NA ASC-C 100 G PO SOLR: 1.0000 | Freq: Once | ORAL | Status: AC

## 2011-02-28 NOTE — Progress Notes (Signed)
[name] [name] [birth year] MRN [id number]   History of Present Illness:  This is a 65 year old white female with coronary artery disease who is on Plavix. She is due for a screening colonoscopy. She had a colonoscopy in 2000 with findings of an adenomatous polyp and she had an incomplete colonoscopy in 2005 due to colon tortuosity and high requirement for sedation. A subsequent barium enema did not show any lesions in the right colon. She denies any specific GI symptoms. Her bowel habits have been regular and she denies abdominal pain. She had a stent placed in 2004 by Dr Melburn Popper.   Past Medical History  Diagnosis Date  . Allergic rhinitis   . Asthma   . CAD (coronary artery disease)   . MI (myocardial infarction)   . Positive PPD   . Anxiety disorder   . Breast cancer     bilateral  . Hyperplastic colon polyp 10/16/99  . Hypertension   . Internal and external hemorrhoids without complication 11/13/04  . Diverticulosis 11/13/04   Past Surgical History  Procedure Date  . Bilateral mastectomy   . Appendectomy   . Coronary angioplasty with stent placement 2004    reports that she has quit smoking. She has never used smokeless tobacco. She reports that she drinks about one ounce of alcohol per week. She reports that she does not use illicit drugs. family history includes Asthma in her daughter; Breast cancer in her mother and sister; Heart disease in her maternal grandmother; and Spina bifida in an unspecified family member.  There is no history of Colon cancer. Allergies  Allergen Reactions  . Lisinopril   . Simvastatin         Review of Systems: Negative for chest pain, shortness of breath, abdominal pain. Her bowel habits are regular. No rectal bleeding  The remainder of the 10  point ROS is negative except as outlined in H&P   Physical Exam: General appearance  Well developed, in no distress. Eyes- non icteric. HEENT nontraumatic, normocephalic. Mouth no lesions,  tongue papillated, no cheilosis. Neck supple without adenopathy, thyroid not enlarged, no carotid bruits, no JVD. Lungs Clear to auscultation bilaterally. Cor normal S1 normal S2, regular rhythm , no murmur,  quiet precordium. Abdomen soft abdomen with normoactive bowel sounds. No tenderness. No palpable mass. Extremities no pedal edema. Skin no lesions. Neurological alert and oriented x 3. Psychological normal mood and affect.  Assessment and Plan:  Problems #1- screening colonoscopy. Patient has a personal history of an adenomatous polyp. She is due for a recall colonoscopy. Patient has a tortuous, redundant colon. We will use propofol for sedation. I will ask Dr. Melburn Popper for permission to hold her Plavix 5 days prior to the procedure. If not possible, we will do the colonoscopy on Plavix.   02/28/2011 [name] [name]

## 2011-02-28 NOTE — Patient Instructions (Signed)
You have been scheduled with a colonoscopy using Propofol sedation. Please follow written instructions given to you at your visit today. We will contact Dr Melburn Popper for recommendations regarding your plavix.

## 2011-03-01 ENCOUNTER — Encounter: Payer: Self-pay | Admitting: Internal Medicine

## 2011-03-01 ENCOUNTER — Telehealth: Payer: Self-pay | Admitting: *Deleted

## 2011-03-01 ENCOUNTER — Telehealth: Payer: Self-pay | Admitting: Cardiovascular Disease

## 2011-03-01 NOTE — Telephone Encounter (Signed)
We have received documentation from Dr Melburn Popper that it is okay for patient to hold plavix for 5 days prior to her procedure. I have spoken to patient and she confirms that Dr Sallee Provencal office has called to let her know this. She verbalizes understanding that she should hold Plavix x 5 days prior to her test.

## 2011-03-01 NOTE — Telephone Encounter (Signed)
Pt phoned and md faxed to hold plavix 5days prior to colonoscopy but to continue asa per dr Elease Hashimoto. Order faxed to MD. Alfonso Ramus RN

## 2011-03-20 ENCOUNTER — Encounter: Payer: Self-pay | Admitting: Internal Medicine

## 2011-03-21 ENCOUNTER — Encounter: Payer: Self-pay | Admitting: Internal Medicine

## 2011-03-21 ENCOUNTER — Ambulatory Visit (AMBULATORY_SURGERY_CENTER): Payer: Medicare Other | Admitting: Internal Medicine

## 2011-03-21 VITALS — BP 136/90 | HR 65 | Temp 97.0°F | Resp 16 | Ht 61.0 in | Wt 110.0 lb

## 2011-03-21 DIAGNOSIS — D126 Benign neoplasm of colon, unspecified: Secondary | ICD-10-CM

## 2011-03-21 DIAGNOSIS — K5730 Diverticulosis of large intestine without perforation or abscess without bleeding: Secondary | ICD-10-CM

## 2011-03-21 DIAGNOSIS — Z1211 Encounter for screening for malignant neoplasm of colon: Secondary | ICD-10-CM

## 2011-03-21 DIAGNOSIS — Z86010 Personal history of colonic polyps: Secondary | ICD-10-CM

## 2011-03-21 MED ORDER — SODIUM CHLORIDE 0.9 % IV SOLN: 500.0000 mL | INTRAVENOUS | Status: DC

## 2011-03-21 NOTE — Patient Instructions (Signed)
Findings:  Polyp, Diverticulosis  Recommendations:  High Fiber Diet                                   Repeat exam in 5-10 years depending on pathology.                                   RESTART PLAVIX TOMORROW>   Discharge instructions given and explained to patient and caregiver.

## 2011-03-27 ENCOUNTER — Encounter: Payer: Self-pay | Admitting: Internal Medicine

## 2011-04-17 NOTE — Assessment & Plan Note (Signed)
[facility]                             PULMONARY OFFICE NOTE   NAME:[name] [name] GEIS                     MRN:          [id number]  DATE:07/15/2007                            DOB:          01/03/1946    PROBLEM LIST:  1. Asthma.  2. Allergic rhinitis.  3. Rhinosinusitis.  4. Coronary disease/myocardial infarction/stents 2004.  5. Chronic sinus surgery changes.   HISTORY:  I last saw her in the fall of 2007 but she saw the nurse  practitioner with increased bronchitis in May of this year responding to  Xopenex and a prednisone taper. She now says she feels okay, a little  sneezing recently and she resumed her Nasonex. No headache, purulent or  bloody discharge. Advair has worked well.   MEDICATIONS:  1. Diovan HCT 160/12.5.  2. Aspirin 325 mg.  3. Crestor 10 mg.  4. Advair 100/50.  5. Omega 3.  6. Zyrtec p.r.n.  7. Plavix 75 mg.  8. Albuterol rescue inhaler.  9. Nasonex.  10.Chlor-Trimeton or Loratadine.   No medication allergy.   CT of the sinuses on August 02, 2005 had shown stable postoperative  changes with near complete opacification of the left maxillary sinus  which was stable and likely represents postoperative change.   OBJECTIVE:  Weight 115 pounds, BP 104/64, pulse 64, room air saturation  98%. Pulse is regular without murmur. Quiet and clear. Speech is not  nasal. There is no adenopathy or stridor, no evident post nasal  drainage.   IMPRESSION:  1. Asthma.  2. Rhinitis with chronic postoperative changes not excluding      rhinosinusitis.  3. Allergic rhinitis component.   PLAN:  We refilled Advair 100/50, albuterol inhaler and Nasonex.  Schedule return in 1 year, earlier p.r.n.     Clinton D. Maple Hudson, MD, [facility], FACP  Electronically Signed    CDY/MedQ  DD: 07/15/2007  DT: 07/17/2007  Job #: [id number]   cc:   Sharlot Gowda, M.D.

## 2011-04-17 NOTE — Assessment & Plan Note (Signed)
[facility]                             PULMONARY OFFICE NOTE   NAME:[name] [name], [name] [name]                     MRN:          [id number]  DATE:04/08/2007                            DOB:          [birth year]    HISTORY OF PRESENT ILLNESS:  The patient is a 65 year old white female  patient of Dr. Maple Hudson who has a known history of asthma and allergic  rhinitis that presents today complaining of a persistent dry cough,  wheezing, and chest tightness.  The patient denies any purulent sputum,  fever, chest pain, orthopnea, PND, or leg swelling.   PAST MEDICAL HISTORY:  Reviewed.   CURRENT MEDICATIONS:  Reviewed.   PHYSICAL EXAMINATION:  GENERAL:  The patient is a pleasant female in no  acute distress.  VITAL SIGNS:  She is afebrile with stable vital signs.  O2 saturation is  97% on room air.  HEENT:  Nasal mucosa is slightly pale.  Nontender sinuses.  Posterior  pharynx is clear.  NECK:  Supple without cervical adenopathy.  No JVD.  CHEST:  Lung sounds reveal expiratory wheezes bilaterally.  Cardiac is a  regular rate and rhythm.  ABDOMEN:  Soft and nontender.  EXTREMITIES:  Warm without any edema.   IMPRESSION AND PLAN:  Asthmatic exacerbation.  The patient is given a  Xopenex nebulizer treatment in the office.  The patient is to begin a  prednisone taper over the next week.  The patient has been complaining  of some reflux symptoms.  I have recommend that she use Prilosec 20 mg  daily over the next 2 weeks and hold her fish oil until symptoms are  resolved.  The patient may also use Zyrtec 10 mg at bedtime as needed  for postnasal drip symptoms and use Mucinex DM twice daily as needed for  cough and congestion.  The patient will return back with Dr. Maple Hudson as  scheduled or sooner if needed.      Rubye Oaks, NP       Clinton D. Maple Hudson, MD, [facility], FACP    TP/MedQ  DD: 04/10/2007  DT: 04/10/2007  Job #: [id number]

## 2011-04-20 NOTE — Assessment & Plan Note (Signed)
[facility]                               PULMONARY OFFICE NOTE   NAME:[name] [name], [name] [name]                     MRN:          [id number]  DATE:08/28/2006                            DOB:          March 02, 1946    PULMONARY FOLLOWUP:   PROBLEM:  1. Asthma.  2. Allergic rhinitis.  3. Rhinosinusitis.  4. Coronary disease/myocardial infarction/stents, 2004.  5. Chronic sinus surgery changes.   HISTORY:  She had last seem me a year ago and comes now for followup.  She  had seen the nurse practitioner for an asthma episode last November with no  recurrence.  She says Advair has been doing very well for control at 250/50.  She coughs after dusting her home, but otherwise does not have repetitive  episodes of symptoms.  Still uses her rescue inhaler occasionally.  She is  aware of reflux that she considers not too bad.  We discussed this  carefully.  There is always a sense of postnasal drip, but I explained that  some of this may be a pharyngeal sensation from the reflux, as well.  She  has seen nothing purulent.  She has not had headache or fever.   MEDICATIONS:  1. Diovan HCT 160/12.5.  2. Aspirin 325 mg.  3. Crestor 10 mg.  4. Advair 250/50.  5. Calcium with vitamin D.  6. Zyrtec p.r.n.  7. Plavix 75 mg.  8. Albuterol rescue inhaler.  9. Nasonex.   ALLERGIES:  No medication allergy.   OBJECTIVE:  VITAL SIGNS:  Weight 116 pounds.  Blood pressure 112/70, pulse  regular at 59, room air saturation of 98%.  HEENT:  Her pharynx is red without visible drainage.  There is no stridor,  adenopathy, or obvious voice strain, and she seems able to breathe through  her nose with her mouth closed.  Conjunctivae are not injected.  LUNGS:  Lung fields are clear.  HEART:  Heart sounds regular without murmur.   A CT scan of her sinuses in August of 2006 showed stable postoperative  changes of both maxillary sinuses compared with an outside CT done Apr 24, 2004.   Near complete opacification of the left maxillary sinus.  This is  little changed, and other sinuses are clear.  She has been aware of the CT  picture.   IMPRESSION:  1. Mild intermittent asthma, well-controlled.  2. Allergic rhinitis.  3. Chronic sinusitis and postoperative scarring.   PLAN:  1. Try changing Advair down to 100/50 one puff b.i.d. with discussion.  2. We refilled albuterol HFA, Zyrtec and Nasonex.  3. Schedule return in 1 year; earlier p.r.n.       Clinton D. Maple Hudson, MD, [facility], FACP      CDY/MedQ  DD:  08/29/2006  DT:  08/31/2006  Job #:  [id number]   cc:   Sharlot Gowda, M.D.

## 2011-04-20 NOTE — Discharge Summary (Signed)
NAME:  [name], [name]                      ACCOUNT NO.:  [id number]   MEDICAL RECORD NO.:  [id number]                   PATIENT TYPE:  INP   LOCATION:  3732                                 FACILITY:  [facility]   PHYSICIAN:  Vesta Mixer, M.D.              DATE OF BIRTH:  1946-07-08   DATE OF ADMISSION:  07/25/2003  DATE OF DISCHARGE:  07/27/2003                                 DISCHARGE SUMMARY   DISCHARGE DIAGNOSES:  1. Acute anteroapical myocardial infarction with reduced left ventricular     systolic function.  2. Hypertension.  3. History of asthma.   DISCHARGE MEDICATIONS:  1. Enteric coated aspirin 325 mg daily.  2. Plavix 75 mg daily.  3. Avapro 150 mg daily.  4. Foradil in Aerolizer q.12h.  5. Pulmicort 2 puffs b.i.d.  6. Albuterol 2 puffs q.6h p.r.n.  7. Nitroglycerin 0.4 mg b.i.d.   DISPOSITION:  The patient will get an echocardiogram in Dr. Sallee Provencal office  in the next one or two days.  We will see her in the office for an  appointment in one to two weeks.  She is to call for an appointment. We will  get a fasting lipid profile in several weeks.   HISTORY:  [name] is a 65 year old female with a recent onset of chest  pain.  Please see dictated H&P for further details.   HOSPITAL COURSE BY PROBLEMS:  PROBLEM 1.  Anteroapical myocardial  infarction.  The patient had a heart catheterization which revealed a  moderate stenosis in her apical LAD.  She had moderate to severe left  ventricular dysfunction with akinesis involving the anterior apex,  apex and  inferior apical walls.  Her ejection fraction was around 30%.  She had one  to two plus mitral regurgitation.  She had a successful PTCA and stenting of  her mid/distal LAD using a 3.0 x 20 mm Taxus stent.  She tolerated the  procedure quite well and was able to ambulate without any significant  problems.  Her CPK-MB level peaked around 20.  This is significantly lower  than what would be otherwise  expected.  It is quite possible that the degree  of anteroapical LV dysfunction was only left ventricular stunting and was  not actually due to an infarct.  I would expect a good bit of this to  recover.   We will see her in the office tomorrow or the next day for an  echocardiogram.  If she has not had significant recovery of her LV function  and apical function then we will need to consider starting her on Coumadin.  She will need to see the cardiac rehab nurses today and will need to walk  several laps up and down the hall prior to discharge today.  Otherwise she  is stable and can go home on the above noted medications.  Vesta Mixer, M.D.    PJN/MEDQ  D:  07/27/2003  T:  07/28/2003  Job:  [id number]   cc:   Sharlot Gowda, M.D.  [address] [place], [place] [address]  Fax: [phone]

## 2011-04-20 NOTE — Cardiovascular Report (Signed)
NAME:  [name], [name]                      ACCOUNT NO.:  [id number]   MEDICAL RECORD NO.:  [id number]                   PATIENT TYPE:  INP   LOCATION:  2929                                 FACILITY:  [facility]   PHYSICIAN:  Vesta Mixer, M.D.              DATE OF BIRTH:  04/02/1946   DATE OF PROCEDURE:  07/25/2003  DATE OF DISCHARGE:                              CARDIAC CATHETERIZATION   [name] [name] is a 65 year old female with history of hypertension and  asthma.  She presented to the emergency room  this morning with episodes of  chest pain which started yesterday.  The patient was out doing strenuous  yard work and had onset of chest pain.  The pain gradually eased, but never  completely went away.  It resumed again this morning and she came to the  emergency room  for further evaluation.  She was found to have positive  troponin levels and a positive CPK-MB and was referred to the cath lab for  further evaluation.   The right femoral artery was easily cannulated using the modified Seldinger  technique.   HEMODYNAMICS:  The left ventricular pressure was 117/8 with an aortic  pressure of 113/73.   CORONARY ANGIOGRAPHY:  1. Left main coronary artery is fairly smooth and normal.  2. The left anterior descending artery is very tortuous consistent with     patient with hypertension.  The mid LAD has an eccentric and hazy 75%     stenosis.  The LAD following this is fairly tortuous.  LAD does not seem     to move much with contraction.  3. The left circumflex artery is fairly normal.  There are minor luminal     irregularities.  The circumflex and marginal vessels are also very     tortuous.  4. The right coronary artery is large and dominant.  It is smooth and normal     and the posterior descending artery and the posterior lateral segment     artery are normal.  5. The left internal mammary artery and the left subclavian artery are     normal.   LEFT VENTRICULOGRAM:   Left ventriculogram was performed at a 30 RAO  position.  It reveals akinesis of the anterior apex, apex and inferior  apical walls.  The ejection fraction is approximately 30%.  There is mild to  moderate mitral regurgitation.   An aortogram was performed to further evaluate her episodes of intrascapular  pain to rule out a dissection.  She was found to have a normal aortic root  and normal thoracic aorta.  The aorta was fairly normal as it descending  down into the abdomen.   PERCUTANEOUS TRANSLUMINAL CORONARY INTERVENTION:  The patient had already  received heparin as well as Integrelin.  The ACT was 280.  The left main was  engaged using a Judkins 3.5 7 [place] guide.  A Patriot guide wire  was  positioned down into the vessel.  Following this, a 3.0 x 20-mm Taxus stent  was positioned across the stenosis.  It was inflated once up to 12  atmospheres for a total of 30 seconds.  She had the identical pain that she  had yesterday when we inflated the balloon.  Followup angiography revealed a  very nice angiographic result. She had some slight spasm around the stent  which resolved with nitroglycerin.  After this, followup angiography  revealed a very nice result.  There a 0% residual stenosis in the stented  segment.  She has a brisk TIMI-3 flow down the vessel.   COMPLICATIONS:  None.    CONCLUSIONS:  1. Single-vessel coronary artery disease involving moderate to severe     stenosis of the left anterior descending.  2. Successful percutaneous transluminal coronary angioplasty and stenting of     the mid left anterior descending.  3. Evidence of periapical myocardial infarction caused by transient     thrombosis of this left anterior descending lesion.  4. Mild to moderate mitral regurgitation.   We will keep the patient here for several days.  We will need to address her  cholesterol.  We will get an echocardiogram as an outpatient for followup.                                                  Vesta Mixer, M.D.    PJN/MEDQ  D:  07/25/2003  T:  07/25/2003  Job:  [id number]   cc:   Sharlot Gowda, M.D.  [address] [place], [place] [address]  Fax: [phone]

## 2011-04-20 NOTE — Op Note (Signed)
NAME:  [name] [name], [name] [name]              ACCOUNT NO.:  [id number]   MEDICAL RECORD NO.:  [id number]          PATIENT TYPE:  AMB   LOCATION:  DSC                          FACILITY:  [facility]   PHYSICIAN:  Rose Phi. Maple Hudson, M.D.   DATE OF BIRTH:  [birth year]   DATE OF PROCEDURE:  05/21/2005  DATE OF DISCHARGE:                                 OPERATIVE REPORT   PREOPERATIVE DIAGNOSIS:  In situ melanoma of the lower back.   POSTOPERATIVE DIAGNOSIS:  In situ melanoma of the lower back.   OPERATION:  Excision of same with primary closure.   SURGEON:  Rose Phi. Maple Hudson, M.D.   ANESTHESIA:  Local.   OPERATIVE PROCEDURE:  Patient placed in the prone position, and the low back  prepped and draped in the usual fashion.  A transverse elliptical incision  around the scar of the melanoma biopsy was then outlined to get about a 0.34  cm margin.  It was then infiltrated with 1% Xylocaine with Adrenaline.  It  was then excised, leaving about a 4 x 2 cm defect.   It was then closed in a single interrupted layer of 4-0 nylon sutures.  Dressing applied.  Patient then allowed to go home.       PRY/MEDQ  D:  05/21/2005  T:  05/21/2005  Job:  [id number]

## 2011-04-20 NOTE — Op Note (Signed)
NAME:  [name] [name], [name] [name]              ACCOUNT NO.:  [id number]   MEDICAL RECORD NO.:  [id number]          PATIENT TYPE:  AMB   LOCATION:  ENDO                         FACILITY:  [facility]   PHYSICIAN:  Petra Kuba, M.D.    DATE OF BIRTH:  1946-06-17   DATE OF PROCEDURE:  11/13/2004  DATE OF DISCHARGE:                                 OPERATIVE REPORT   PROCEDURE:  Colonoscopy.   INDICATIONS:  Screening.  Consent was signed after risks, benefits, methods,  and options were thoroughly discussed in the office in the past.   PREMEDICATIONS:  Demerol 70 mg, Versed 8 mg.   DESCRIPTION OF PROCEDURE:  Rectal inspection pertinent for external  hemorrhoids.  Digital exam was negative.  Video pediatric adjustable  colonoscope was inserted and due to a tortuous sigmoid, was able to be  advanced to the proximal splenic flexure.  Unfortunately at that point the  scope began to loop and with rolling her on her back and rolling her on her  right side, rolling her on her back, and then finally back to her left side,  we might have been able to get to hepatic flexure.  Unfortunately, we could  not advance any further and we elected to withdraw.  Other than the left-  sided diverticula, no abnormalities were seen.  We went ahead and inserted  the upper endoscope and could get to the mid transverse probably, but again  despite all three positions and various abdominal pressures, due to unable  to control the looping, we could not advance any further.  We elected to  withdraw.  Again on insertion and withdrawal, no additional findings were  seen.  Anorectal pull-through and retroflexion confirmed the small  hemorrhoids.  The scope was removed.  The patient tolerated the procedure  well.  There were no obvious immediate complications.   ENDOSCOPIC DIAGNOSES:  1.  Internal and external hemorrhoids.  2.  Left-sided diverticula.  3.  Tortuous looping colon.  Unable to advance either the pediatric  adjustable or the upper endoscope past either the mid transverse or      possibly even the hepatic flexure.   PLAN:  Go ahead and get an air contrast barium enema.  Happy to see back  p.r.n.  When repeat screening is needed, consider virtual colonoscopy if  widely available at that juncture.     MEM/MEDQ  D:  11/13/2004  T:  11/13/2004  Job:  [id number]   cc:   Sharlot Gowda, M.D.  [address]  [place], [place] [address]  Fax: [phone]

## 2011-04-20 NOTE — H&P (Signed)
NAME:  [name] [name], [name] [name]                      ACCOUNT NO.:  [id number]   MEDICAL RECORD NO.:  [id number]                   PATIENT TYPE:  EMS   LOCATION:  MINO                                 FACILITY:  [facility]   PHYSICIAN:  Vesta Mixer, M.D.              DATE OF BIRTH:  [birth year]   DATE OF ADMISSION:  07/25/2003  DATE OF DISCHARGE:                                HISTORY & PHYSICAL   HISTORY:  [name] [name] is a 65 year old female with a history of hypertension  and asthma. She is admitted with severe chest pain and shortness of breath.   The patient has been relatively healthy. She works very hard out in her  garden all the time. Over the past couple of days she says that she has not  been feeling very well. Yesterday she worked quite hard out in her garden.  She was moving lots of bags of potting soil and dirt and was running a  rotatiller. She started having severe episodes of chest pain. The pain  started off as a sharp pain right between her shoulder blades. She then had  severe chest pain associated with some jaw pain. The pain gradually subsided  as she rested and it went to more of a dull pain. She took some aspirin and  felt better. She went out and cleaned up the remainder of her yard material  and felt somewhat better. This morning she continued to have some  intermittent episodes of chest pain. She was brought in by her son after he  found out that she had been having some chest discomfort.   The patient still has between a 5-6/10 episodes of chest discomfort. She  states that she is able to breathe quite a bit better. She has a long  history of asthma and says that she has never had episodes of chest pain  when she has had an asthma attack.   CURRENT MEDICATIONS:  1. Hydrochlorothiazide 12.5 mg a day.  2. Estrogen once a day.  3. Albuterol inhaler.  4. Pulmicort inhaler.  5. Foradil inhaler.   ALLERGIES:  No known drug allergies.   PAST MEDICAL HISTORY:  1.  Hypertension.  2. Asthma.   SOCIAL HISTORY:  The patient smoked a few years as a teenager. She uses  alcohol occasionally.   FAMILY HISTORY:  Grandfather had a pacemaker.   REVIEW OF SYSTEMS:  She denies any problems with her eyes, ears, nose, or  throat. She denies any heat or cold intolerance, weight gain or weight loss.  She denies any nausea, vomiting, or GI disturbances. She does have frequent  episodes of asthma and dislikes taking her albuterol because it makes her  have palpitations. She denies any syncope, orthopnea, or PND. She denies any  easy bruisability. She denies any musculoskeletal pain. Review of systems  was reviewed and is otherwise negative except for as noted in the HPI.   PHYSICAL  EXAMINATION:  GENERAL:  She is a middle-aged female in no acute  distress. She is alert and oriented x3 and her mood and affect are normal.  VITAL SIGNS:  Heart rate is 70, blood pressure 120/80. Respirations normal.  HEENT:  2+ carotids. She has no bruits. There is no JVD, no thyromegaly.  LUNGS:  Clear to auscultation.  HEART:  Regular rate, S1, S2. She has a mid systolic click and has a 2/6  systolic ejection murmur at the left sternal border.  ABDOMEN:  Nontender. She has good bowel sounds and she has no  hepatosplenomegaly.  EXTREMITIES:  She has good pulses. Her calves are nontender. She has no  cyanosis, clubbing, or edema.  NEUROLOGIC:  Cranial nerves 2-12 are intact and the motor and sensory  function are intact.   LABORATORY DATA:  EKG reveals normal sinus rhythm. There is a very trace  amount of ST segment elevation in the inferior leads. There are also T-wave  inversions in the lateral leads. These are new from her last EKG from 1995.  Her cardiac enzymes returned positive with a CK-MB of 22 and a troponin of  8.   ASSESSMENT:  [name] [name] presents with episodes of chest pain starting  yesterday. Cardiac enzymes indicate that this is, in fact, a myocardial   infarction. I suspect that she has a right coronary artery stenosis that  probably has opened up partially since her symptoms are improving at this  point. She is, however, still having 5/10 chest pain and we will proceed  with heart catheterization today. We discussed the risks, benefits, and  options of heart catheterization. She understands and agrees to proceed.                                                 Vesta Mixer, M.D.    PJN/MEDQ  D:  07/25/2003  T:  07/26/2003  Job:  [id number]   cc:   Allayne Butcher, M.D.

## 2011-05-08 ENCOUNTER — Encounter: Payer: Self-pay | Admitting: Internal Medicine

## 2011-05-08 ENCOUNTER — Ambulatory Visit (INDEPENDENT_AMBULATORY_CARE_PROVIDER_SITE_OTHER): Payer: Medicare Other | Admitting: Internal Medicine

## 2011-05-08 VITALS — BP 124/78 | HR 63 | Ht 62.0 in | Wt 112.8 lb

## 2011-05-08 DIAGNOSIS — J45909 Unspecified asthma, uncomplicated: Secondary | ICD-10-CM

## 2011-05-08 DIAGNOSIS — J309 Allergic rhinitis, unspecified: Secondary | ICD-10-CM

## 2011-05-08 NOTE — Patient Instructions (Signed)
Sample Dulera 100-5     Usual dose is 2 puffs and rinse mouth well, twice daily.       INSTEAD- I want you to start with one puff, once daily in the mornings for 2-3 days. If you tolerate that, then go to 1 puff and rinse mouth twice daily. If still ok you can try 2 puffs and rinse, twice daily.  Use the Dulera instead of Pulmicort, but continue Spiriva

## 2011-05-08 NOTE — Assessment & Plan Note (Addendum)
Mild intermittent asthma. We discussed and considered potential overlap with cardiac symptoms. She may not tolerate Dulera, but she has been tolerating Proair several times daily. It may be her technique got in Advair better than she does with MDIs. Singulair didn't help.

## 2011-05-08 NOTE — Progress Notes (Signed)
  Subjective:    Patient ID: [name] [name], female    DOB: [birth year], 65 y.o.   MRN: [id number]  HPI 05/08/11 - 64 yoF followed for asthma, complicated by recurrent rhinosinusitis. Hx CAD/ MI/ Nancee Liter Last here March 13, 2010 - note reviewed.  Hx palpitation with Advair She is now using Spiriva daily, but more usually in runs. Daily Pulmicort. Albuterol rescue inhaler as needed- gradually more often in last 2 weeks. Blames weather ?? But not solely. Denies rhintis/ sinus drainage and denies chest pain, palpitation. Implication that Pulmicort may not be enough. Hx severe cough from lisinopril  Review of Systems Constitutional:   No weight loss, night sweats,  Fevers, chills, fatigue, lassitude. HEENT:   No headaches,  Difficulty swallowing,  Tooth/dental problems,  Sore throat,                No sneezing, itching, ear ache, nasal congestion, post nasal drip,   CV:  No chest pain,  Orthopnea, PND, swelling in lower extremities, anasarca, dizziness, palpitations  GI  No heartburn, indigestion, abdominal pain, nausea, vomiting, diarrhea, change in bowel habits, loss of appetite  Resp: Some shortness of breath with exertion or at rest.  No excess mucus, no productive cough,  No non-productive cough,  No coughing up of blood.  No change in color of mucus.  No wheezing.  Skin: no rash or lesions.  GU: no dysuria, change in color of urine, no urgency or frequency.  No flank pain.  MS:  No joint pain or swelling.  No decreased range of motion.  No back pain.  Psych:  No change in mood or affect. No depression or anxiety.  No memory loss.      Objective:   Physical Exam General- Alert, Oriented, Affect-appropriate, Distress- none acute  Slender, looks well  Skin- rash-none, lesions- none, excoriation- none  Lymphadenopathy- none  Head- atraumatic  Eyes- Gross vision intact, PERRLA, conjunctivae clear secretions  Ears- Hearing, canals, Tm- normal  Nose- Clear, No- Septal dev, mucus,  polyps, erosion, perforation   Throat- Mallampati II , mucosa clear , drainage- none, tonsils- atrophic  Neck- flexible , trachea midline, no stridor , thyroid nl, carotid no bruit  Chest - symmetrical excursion , unlabored     Heart/CV- RRR , no murmur , no gallop  , no rub, nl s1 s2                     - JVD- none , edema- none, stasis changes- none, varices- none     Lung- clear to P&A, wheeze- none, cough- none , dullness-none, rub- none   Mild pseudowheeze.     Chest wall-  Bilateral mastectomy  Abd- tender-no, distended-no, bowel sounds-present, HSM- no  Br/ Gen/ Rectal- Not done, not indicated  Extrem- cyanosis- none, clubbing, none, atrophy- none, strength- nl  Neuro- grossly intact to observation         Assessment & Plan:

## 2011-05-13 NOTE — Assessment & Plan Note (Signed)
Not currently a control problem. Discussed seasonality, antihistamines.

## 2011-06-18 ENCOUNTER — Ambulatory Visit (INDEPENDENT_AMBULATORY_CARE_PROVIDER_SITE_OTHER): Payer: Medicare Other | Admitting: Nurse Practitioner

## 2011-06-18 ENCOUNTER — Encounter: Payer: Self-pay | Admitting: Nurse Practitioner

## 2011-06-18 VITALS — BP 160/80 | HR 70 | Ht 62.0 in | Wt 114.4 lb

## 2011-06-18 DIAGNOSIS — I2510 Atherosclerotic heart disease of native coronary artery without angina pectoris: Secondary | ICD-10-CM

## 2011-06-18 DIAGNOSIS — R079 Chest pain, unspecified: Secondary | ICD-10-CM

## 2011-06-18 MED ORDER — NEBIVOLOL HCL 5 MG PO TABS: 5.0000 mg | ORAL_TABLET | Freq: Every day | ORAL | Status: DC

## 2011-06-18 NOTE — Progress Notes (Signed)
[name] [name] Date of Birth: 1946-06-26   History of Present Illness: [name] [name] is seen today for a work in visit. She is seen for Dr. Elease Hashimoto. She has several issues. She notes that about 5 weeks ago, she began having tightness in her throat and was short of breath. She thought it was her asthma. She went to see pulmonary who thought she was ok and told her to come here. She got better with no specific treatment. About 2 weeks ago, the symptoms returned. She is not having exertional symptoms. Walking helps her. She thinks some of it though does feel like her prior chest pain syndrome. She admits to being very anxious. Blood pressure is up today. She does not check at home because she says it "runs too high". She has been lightheaded and dizzy. She wants to make sure everything is ok because she is leaving for [place] next Tuesday for a week visit.   Current Outpatient Prescriptions on File Prior to Visit  Medication Sig Dispense Refill  . albuterol (PROAIR HFA) 108 (90 BASE) MCG/ACT inhaler Inhale 2 puffs into the lungs every 6 (six) hours as needed.        Marland Kitchen aspirin 81 MG tablet Take 81 mg by mouth daily. 3 times a week      . budesonide (PULMICORT FLEXHALER) 180 MCG/ACT inhaler Inhale 1 puff into the lungs 2 (two) times daily. Rinse mouth well after use.       . calcium carbonate (OS-CAL) 600 MG TABS Take 600 mg by mouth daily. 1Tablet Daily      . Cholecalciferol (VITAMIN D3 PO) Take 1,200 mg by mouth.        . clopidogrel (PLAVIX) 75 MG tablet Take 75 mg by mouth daily.        . fluticasone (VERAMYST) 27.5 MCG/SPRAY nasal spray Place 2 sprays into the nose as needed.        . Glucosamine-Chondroitin-MSM 500-200-150 MG TABS Take by mouth.        . Loratadine (CLARITIN) 10 MG CAPS Take by mouth daily as needed.        . Multiple Vitamins-Minerals (CENTRUM SILVER PO) Take by mouth daily.        Marland Kitchen PREMARIN vaginal cream Use as directed       . Probiotic Product (ALIGN PO) Take by mouth  daily.        . rosuvastatin (CRESTOR) 10 MG tablet Take 10 mg by mouth daily.        Marland Kitchen tiotropium (SPIRIVA HANDIHALER) 18 MCG inhalation capsule Place 18 mcg into inhaler and inhale daily.        Marland Kitchen trimethoprim (TRIMPEX) 100 MG tablet Take 1 tablet by mouth Daily.      . valsartan (DIOVAN) 160 MG tablet Take 160 mg by mouth daily.        . nebivolol (BYSTOLIC) 5 MG tablet Take 1 tablet (5 mg total) by mouth daily.  30 tablet      Allergies  Allergen Reactions  . Lisinopril   . Simvastatin     Past Medical History  Diagnosis Date  . Allergic rhinitis   . Asthma   . CAD (coronary artery disease)   . MI (myocardial infarction) 2004    Anteroapical MI with PCI to the LAD  . Positive PPD   . Anxiety disorder   . Breast cancer     bilateral  . Hyperplastic colon polyp 10/16/99  . Hypertension   . Internal and  external hemorrhoids without complication 11/13/04  . Diverticulosis 11/13/04  . PVC (premature ventricular contraction)     Past Surgical History  Procedure Date  . Bilateral mastectomy   . Appendectomy   . Coronary angioplasty with stent placement 2004    LAD  . US echocardiography 03/27/2004    resting ef 60%  . Cardiovascular stress test 07/01/2006    ef 80%    History  Smoking status  . Former Smoker  . Quit date: 08/07/1966  Smokeless tobacco  . Never Used    History  Alcohol Use  . 1.0 oz/week  . 2 drink(s) per week    Family History  Problem Relation Age of Onset  . Breast cancer Mother   . Breast cancer Sister   . Heart disease Maternal Grandmother   . Colon cancer Neg Hx   . Asthma Daughter   . Spina bifida      Review of Systems: The review of systems is positive for anxiety, throat pain, palpitations and DOE.   All other systems were reviewed and are negative.  Physical Exam: BP 160/80  Pulse 70  Ht 5\' 2"  (1.575 m)  Wt 114 lb 6.4 oz (51.891 kg)  BMI 20.92 kg/m2 Patient is anxious but in no acute distress. Skin is warm and dry.  Color is normal.  HEENT is unremarkable. Normocephalic/atraumatic. PERRL. Sclera are nonicteric. Neck is supple. No masses. No JVD. Lungs are clear. Cardiac exam shows a regular rate and rhythm. She does have an occasional ectopic.  Abdomen is soft. Extremities are without edema. Gait and ROM are intact. No gross neurologic deficits noted.  LABORATORY DATA:  EKG shows sinus with a PVC.   Assessment / Plan:

## 2011-06-18 NOTE — Patient Instructions (Signed)
We are going to try you on some Bystolic. This will help with your PVC's, your blood pressure and your chest tightness. Hopefully, it will not aggravate your ashthma.  See Tammy tomorrow We are going to arrange for a stress test I will have you see Dr. Elease Hashimoto in 2 to 3 weeks Call for any problems.

## 2011-06-18 NOTE — Assessment & Plan Note (Signed)
Hard to discern the etiology for all of her complaints. She will be seeing pulmonary again tomorrow. Her blood pressure is up. She is having throat tightness and PVCs. She has known CAD and her last stress test was in 2007. We will update her nuclear. I have added very low dose Bystolic at 5mg  to take daily. Hopefully she will tolerate. She is to monitor some blood pressures at home. I will have her see Dr. Elease Hashimoto in 3 weeks. Patient is agreeable to this plan and will call if any problems develop in the interim.

## 2011-06-19 ENCOUNTER — Ambulatory Visit (INDEPENDENT_AMBULATORY_CARE_PROVIDER_SITE_OTHER)
Admission: RE | Admit: 2011-06-19 | Discharge: 2011-06-19 | Disposition: A | Discharge status: Home / Self Care | Payer: Medicare Other | Source: Ambulatory Visit | Attending: Adult Health | Admitting: Adult Health

## 2011-06-19 ENCOUNTER — Ambulatory Visit (INDEPENDENT_AMBULATORY_CARE_PROVIDER_SITE_OTHER): Payer: Medicare Other | Admitting: Adult Health

## 2011-06-19 ENCOUNTER — Encounter: Payer: Self-pay | Admitting: Adult Health

## 2011-06-19 ENCOUNTER — Encounter: Payer: Self-pay | Admitting: Nurse Practitioner

## 2011-06-19 ENCOUNTER — Telehealth: Payer: Self-pay | Admitting: Cardiovascular Disease

## 2011-06-19 VITALS — BP 106/64 | HR 56 | Temp 97.5°F | Ht 62.0 in | Wt 112.6 lb

## 2011-06-19 DIAGNOSIS — J45909 Unspecified asthma, uncomplicated: Secondary | ICD-10-CM

## 2011-06-19 NOTE — Progress Notes (Deleted)
  Subjective:    Patient ID: [name] [name], female    DOB: [birth year], 65 y.o.   MRN: [id number]  HPI tightness in lower throat/upper chest, increased SOB x10days - reports has followed up with cards and was started on bystolic and sched for stress test   Review of Systems     Objective:   Physical Exam        Assessment & Plan:

## 2011-06-19 NOTE — Patient Instructions (Addendum)
Brush, rinse and gargle after inhaler uses.  Hold actonel for now.  GERD diet  Delsym 2 tsp Twice daily  As needed  Cough  Sugarless candy to help prevent tickle in throat. -NO MINTS  Claritin 10mg  daily for 1 week then As needed   Begin Nexium 40mg  daily until samples are gone, then Prilosec 20mg  daily before meal .

## 2011-06-19 NOTE — Telephone Encounter (Signed)
Called today wondering if she could take the medication that was given to her yesterday early in the morning because it kept her from sleeping and was wondering if she could schedule her labwork to be done here instead of doing them at her PCP's office. Please call back. I have pulled her chart.

## 2011-06-19 NOTE — Telephone Encounter (Signed)
Ok to take Bystolic in am

## 2011-06-20 ENCOUNTER — Encounter: Payer: Self-pay | Admitting: *Deleted

## 2011-06-21 ENCOUNTER — Telehealth: Payer: Self-pay | Admitting: Adult Health

## 2011-06-21 NOTE — Telephone Encounter (Signed)
Called spoke with patient, advised of 7.17.12 cxr results / recs as stated by TP.  Pt verbalized her understanding.

## 2011-06-22 NOTE — Assessment & Plan Note (Signed)
Slow to resolve flare with upper airway irritation ? gerd /bisphosphanate side effect   Plan Brush, rinse and gargle after inhaler uses.  Hold actonel for now.  GERD diet  Delsym 2 tsp Twice daily  As needed  Cough  Sugarless candy to help prevent tickle in throat. -NO MINTS  Claritin 10mg  daily for 1 week then As needed   Begin Nexium 40mg  daily until samples are gone, then Prilosec 20mg  daily before meal .

## 2011-06-22 NOTE — Progress Notes (Signed)
Subjective:    Patient ID: [name] [name], female    DOB: 1946-08-06, 65 y.o.   MRN: [id number]  HPI 05/08/11 - 64 yoF followed for asthma, complicated by recurrent rhinosinusitis. Hx CAD/ MI/ Nancee Liter Last here March 13, 2010 - note reviewed.  Hx palpitation with Advair She is now using Spiriva daily, but more usually in runs. Daily Pulmicort. Albuterol rescue inhaler as needed- gradually more often in last 2 weeks. Blames weather ?? But not solely. Denies rhintis/ sinus drainage and denies chest pain, palpitation. Implication that Pulmicort may not be enough. Hx severe cough from lisinopril  06/19/11 Acute OV  Pt returns with persistent dry cough . Complains of tightness in lower throat/upper chest, increased SOB x10days . Seen last month with similar symptoms was changed to dulera however could not tolerate. She is now back on Pulmicort. She is taking her Spiriva.  Has some heartburn. We reviewed her meds and she is on bisphosphnate. Says she feels burning in upper chest /throat area.  Worse at night at times. No discolored mucus or fever.  Is being followed by cards with recent visit for similar symptoms. She is going for stress test in next few days.  No exertional chest pain.    Review of Systems Constitutional:   No weight loss, night sweats,  Fevers, chills, fatigue, lassitude. HEENT:   No headaches,  Difficulty swallowing,  Tooth/dental problems,  Sore throat,                No sneezing, itching, ear ache, nasal congestion, post nasal drip,   CV:  No chest pain,  Orthopnea, PND, swelling in lower extremities, anasarca, dizziness, palpitations +chest tightness  GI  No heartburn, indigestion, abdominal pain, nausea, vomiting, diarrhea, change in bowel habits, loss of appetite  Resp: Some shortness of breath with exertion or at rest.  No excess mucus, no productive cough,    No coughing up of blood.  No change in color of mucus.  No wheezing.   Skin: no rash or lesions.  GU: no  dysuria, change in color of urine, no urgency or frequency.  No flank pain.  MS:  No joint pain or swelling.  No decreased range of motion.  No back pain.  Psych:  No change in mood or affect. No depression or anxiety.  No memory loss.      Objective:   Physical Exam General- Alert, Oriented, Affect-appropriate, Distress- none acute  Slender, looks well  Skin- rash-none, lesions- none, excoriation- none  Lymphadenopathy- none  Head- atraumatic  Eyes- Gross vision intact, PERRLA, conjunctivae clear secretions  Ears- Hearing, canals, Tm- normal  Nose- Clear, No- Septal dev, mucus, polyps, erosion, perforation   Throat- Mallampati II , mucosa clear , drainage- none, tonsils- atrophic  Neck- flexible , trachea midline, no stridor , thyroid nl, carotid no bruit  Chest - symmetrical excursion , unlabored     Heart/CV- RRR , no murmur , no gallop  , no rub, nl s1 s2                     - JVD- none , edema- none, stasis changes- none, varices- none     Lung- clear to P&A, wheeze- none, cough- none , dullness-none, rub- none   Mild pseudowheeze.     Chest wall-  Bilateral mastectomy  Abd- tender-no, distended-no, bowel sounds-present, HSM- no  Br/ Gen/ Rectal- Not done, not indicated  Extrem- cyanosis- none, clubbing, none, atrophy- none, strength- nl  Neuro- grossly intact to observation         Assessment & Plan:

## 2011-06-25 ENCOUNTER — Ambulatory Visit (HOSPITAL_COMMUNITY): Payer: Medicare Other | Attending: Cardiovascular Disease | Admitting: Radiology

## 2011-06-25 DIAGNOSIS — R0602 Shortness of breath: Secondary | ICD-10-CM

## 2011-06-25 DIAGNOSIS — I2510 Atherosclerotic heart disease of native coronary artery without angina pectoris: Secondary | ICD-10-CM

## 2011-06-25 DIAGNOSIS — R079 Chest pain, unspecified: Secondary | ICD-10-CM | POA: Insufficient documentation

## 2011-06-25 DIAGNOSIS — R0789 Other chest pain: Secondary | ICD-10-CM

## 2011-06-25 MED ORDER — REGADENOSON 0.4 MG/5ML IV SOLN
0.4000 mg | Freq: Once | INTRAVENOUS | Status: AC
  Administered 2011-06-25: 0.4 mg via INTRAVENOUS

## 2011-06-25 MED ORDER — TECHNETIUM TC 99M TETROFOSMIN IV KIT
33.0000 | PACK | Freq: Once | INTRAVENOUS | Status: AC | PRN
  Administered 2011-06-25: 33 via INTRAVENOUS

## 2011-06-25 MED ORDER — TECHNETIUM TC 99M TETROFOSMIN IV KIT
11.0000 | PACK | Freq: Once | INTRAVENOUS | Status: AC | PRN
  Administered 2011-06-25: 11 via INTRAVENOUS

## 2011-06-25 NOTE — Progress Notes (Addendum)
[facility] SITE 3 NUCLEAR MED [address] [place] [address] [phone]  Cardiology Nuclear Med Study  [name] [name] is a 65 y.o. female [id number] 10/04/1946   Nuclear Med Background Indication for Stress Test:  Evaluation for Ischemia, Stent Patency and PTCA Patency History: '04 Angioplasty, Asthma, '05 Echo: EF 60%, '04 Heart Catheterization: EF 30% 1V DZ, '04 Myocardial Infarction, 07/07 Myocardial Perfusion Study: NL EF 80% and '04 Stents: LAD Cardiac Risk Factors: Family History - CAD, History of Smoking, Hypertension and Lipids  Symptoms:  Chest Tightness, Dizziness, Light-Headedness and Palpitations   Nuclear Pre-Procedure Caffeine/Decaff Intake:  None NPO After: 7:30am   Lungs:  clear IV 0.9% NS with Angio Cath:  20g  IV Site: R Wrist  IV Started by:  Cathlyn Parsons, RN  Chest Size (in):  32 Cup Size: A  Height: 5\' 2"  (1.575 m)  Weight:  113 lb (51.256 kg)  BMI:  Body mass index is 20.67 kg/(m^2). Tech Comments: Bilateral Mastectomy but may stick or do BP in either arm. Bystolic held x 24 hrs. Patient did breathing treatments this am. Patient is wheezing on expiration,O2 sat 98. She used her albuterol. Lungs improved.    Nuclear Med Study 1 or 2 day study: 1 day  Stress Test Type:  Treadmill/Lexiscan  Reading MD: Dietrich Pates, MD  Order Authorizing Provider:  P.Nahser  Resting Radionuclide: Technetium 46m Tetrofosmin  Resting Radionuclide Dose: 11 mCi   Stress Radionuclide:  Technetium 81m Tetrofosmin  Stress Radionuclide Dose: 33 mCi           Stress Protocol Rest HR:50 Stress HR: 90  Rest BP: 136/79 Stress BP: 166/79  Exercise Time (min): 9:30 METS: 9.10   Predicted Max HR: 154 bpm % Max HR: 61.69 bpm Rate Pressure Product: 21308   Dose of Adenosine (mg):  n/a Dose of Lexiscan: 0.4 mg  Dose of Atropine (mg): n/a Dose of Dobutamine: n/a mcg/kg/min (at max HR)  Stress Test Technologist: Milana Na, EMT-P  Nuclear  Technologist:  Domenic Polite, CNMT     Rest Procedure:  Myocardial perfusion imaging was performed at rest 45 minutes following the intravenous administration of Technetium 33m Tetrofosmin. Rest ECG: Sinus Bradycardia with PVCS  Stress Procedure:  The patient received IV Lexiscan 0.4 mg over 15-seconds with concurrent low level exercise and then Technetium 93m Tetrofosmin was injected at 30-seconds while the patient continued walking one more minute.  There were no significant changes and freq pvcs with Lexiscan.  Quantitative spect images were obtained after a 45-minute delay. Stress ECG: Frequent PVCs throughout test.  Patient unable to reach target HR with exercise.  There were no significant ST changes with lexiscan.  QPS Raw Data Images:  Normal; no motion artifact; normal heart/lung ratio. Stress Images:  Normal homogeneous uptake in all areas of the myocardium. Rest Images:  Normal homogeneous uptake in all areas of the myocardium. Subtraction (SDS):  No evidence of ischemia. Transient Ischemic Dilatation (Normal <1.22): .99 Lung/Heart Ratio (Normal <0.45):  .32  Quantitative Gated Spect Images QGS EDV:  70 ml QGS ESV:  14 ml QGS cine images:  NL LV Function; NL Wall Motion QGS EF: 80%  Impression Exercise Capacity:  Patient did not reach target HR with over 9 min exercise.  Switched to [employer]. BP Response:  Normal blood pressure response. Clinical Symptoms:  No chest pain. ECG Impression:  No significant ST segment change suggestive of ischemia. Comparison with Prior Nuclear Study: Unchanged from previous report.  Overall Impression:  Normal stress nuclear study. Dietrich Pates   06/26/11 Normal stress myoview  Elyn Aquas.

## 2011-06-26 ENCOUNTER — Telehealth: Payer: Self-pay | Admitting: *Deleted

## 2011-06-26 NOTE — Telephone Encounter (Signed)
Patient called with echo results. Pt verbalized understanding. Jodette Aubriel Khanna RN  

## 2011-06-26 NOTE — Progress Notes (Signed)
nuc med report routed to Dr. Elease Hashimoto 06/26/11 Domenic Polite

## 2011-07-09 ENCOUNTER — Other Ambulatory Visit: Payer: Self-pay | Admitting: Cardiovascular Disease

## 2011-07-09 MED ORDER — NEBIVOLOL HCL 5 MG PO TABS: 5.0000 mg | ORAL_TABLET | Freq: Every day | ORAL | Status: DC

## 2011-07-09 NOTE — Telephone Encounter (Signed)
Samples available, gave two weeks and called in script for two months, pt has upcoming app, unsure if she will remain on same med.

## 2011-07-09 NOTE — Telephone Encounter (Signed)
Called needing more samples of Bystolic because she will run out a good week prior to her next appointment and she is concerned about stopping abruptly. Please call back. I have pulled her chart.

## 2011-07-18 ENCOUNTER — Ambulatory Visit (INDEPENDENT_AMBULATORY_CARE_PROVIDER_SITE_OTHER): Payer: Medicare Other | Admitting: Cardiovascular Disease

## 2011-07-18 ENCOUNTER — Encounter: Payer: Self-pay | Admitting: Cardiovascular Disease

## 2011-07-18 VITALS — BP 122/74 | HR 56 | Ht 60.5 in | Wt 113.6 lb

## 2011-07-18 DIAGNOSIS — I2510 Atherosclerotic heart disease of native coronary artery without angina pectoris: Secondary | ICD-10-CM

## 2011-07-18 DIAGNOSIS — E785 Hyperlipidemia, unspecified: Secondary | ICD-10-CM | POA: Insufficient documentation

## 2011-07-18 MED ORDER — NITROGLYCERIN 0.4 MG/SPRAY TL SOLN: 1.0000 | Status: DC | PRN

## 2011-07-18 NOTE — Assessment & Plan Note (Signed)
The patient has a history of coronary artery disease and status post PTCA and stenting of her mid LAD in 2004. She was recently seen by [facility] and had some episodes of chest discomfort. A repeat stress Myoview study revealed no evidence of ischemia and she had normal left ventricular systolic function. I do not think that her symptoms were related to coronary artery disease and I think that she is very stable.

## 2011-07-18 NOTE — Assessment & Plan Note (Signed)
Dewayne Hatch has a history of hyperlipidemia and coronary disease. Her goal LDL is 70. We'll continue with her current dose of Crestor 10 mg a day. We'll recheck her lipids, basic metabolic profile, and hepatic profile in 6 months at her next office visit.

## 2011-07-18 NOTE — Progress Notes (Signed)
[name] [name] Date of Birth  Jan 11, 1946 [facility] / [facility] [address]     [address] [place], [place]  [address] [phone]  Fax  [phone]  History of Present Illness:  [name] [name] is a 65 year old female with history of coronary artery disease.  She has a stent in her mid LAD.   She was seen by [facility] Fiscal several weeks ago with some palpitations and question of chest pain. A stress Myoview study was normal. She has no evidence of ischemia. Chest normal left ventricular systolic function.  She was started on Bystolic and she feels quite a bit better.   Current Outpatient Prescriptions on File Prior to Visit  Medication Sig Dispense Refill  . albuterol (PROAIR HFA) 108 (90 BASE) MCG/ACT inhaler Inhale 2 puffs into the lungs every 6 (six) hours as needed.        Marland Kitchen aspirin 81 MG tablet Take 81 mg by mouth daily. 3 times a week      . budesonide (PULMICORT FLEXHALER) 180 MCG/ACT inhaler Inhale 1 puff into the lungs 2 (two) times daily. Rinse mouth well after use.       . calcium carbonate (OS-CAL) 600 MG TABS Take 600 mg by mouth daily. 1Tablet Daily      . Cholecalciferol (VITAMIN D3 PO) Take 1,200 mg by mouth.        . clopidogrel (PLAVIX) 75 MG tablet Take 75 mg by mouth daily.        . fluticasone (VERAMYST) 27.5 MCG/SPRAY nasal spray Place 2 sprays into the nose as needed.        . Glucosamine-Chondroitin-MSM 500-200-150 MG TABS Take by mouth.        . Loratadine (CLARITIN) 10 MG CAPS Take by mouth daily as needed.        . Multiple Vitamins-Minerals (CENTRUM SILVER PO) Take by mouth daily.        . nebivolol (BYSTOLIC) 5 MG tablet Take 1 tablet (5 mg total) by mouth daily.  30 tablet  1  . PREMARIN vaginal cream Use as directed       . Probiotic Product (ALIGN PO) Take by mouth daily.        . rosuvastatin (CRESTOR) 10 MG tablet Take 10 mg by mouth daily.        Marland Kitchen tiotropium (SPIRIVA HANDIHALER) 18 MCG inhalation capsule Place 18 mcg into inhaler and inhale  daily.        Marland Kitchen trimethoprim (TRIMPEX) 100 MG tablet Take 1 tablet by mouth Daily.      . valsartan (DIOVAN) 160 MG tablet Take 160 mg by mouth daily.        . ACTONEL 150 MG tablet Take 1 tablet by mouth Every month. HOLD 7.16.12        Allergies  Allergen Reactions  . Lisinopril   . Simvastatin     Past Medical History  Diagnosis Date  . Allergic rhinitis   . Asthma   . CAD (coronary artery disease)   . MI (myocardial infarction) 2004    Anteroapical MI with PCI to the LAD  . Positive PPD   . Anxiety disorder   . Breast cancer     bilateral  . Hyperplastic colon polyp 10/16/99  . Hypertension   . Internal and external hemorrhoids without complication 11/13/04  . Diverticulosis 11/13/04  . PVC (premature ventricular contraction)     Past Surgical History  Procedure Date  . Bilateral mastectomy   . Appendectomy   .  Coronary angioplasty with stent placement 2004    LAD  . US echocardiography 03/27/2004    resting ef 60%  . Cardiovascular stress test 07/01/2006    ef 80%    History  Smoking status  . Former Smoker  . Quit date: 08/07/1966  Smokeless tobacco  . Never Used    History  Alcohol Use  . 1.0 oz/week  . 2 drink(s) per week    Family History  Problem Relation Age of Onset  . Breast cancer Mother   . Breast cancer Sister   . Heart disease Maternal Grandmother   . Colon cancer Neg Hx   . Asthma Daughter   . Spina bifida      Reviw of Systems:  Reviewed in the HPI.  All other systems are negative.  Physical Exam: BP 122/74  Pulse 56  Ht 5' 0.5" (1.537 m)  Wt 113 lb 9.6 oz (51.529 kg)  BMI 21.82 kg/m2 The patient is alert and oriented x 3.  The mood and affect are normal.   Skin: warm and dry.  Color is normal.    HEENT:   the sclera are nonicteric.  The mucous membranes are moist.  The carotids are 2+ without bruits.  There is no thyromegaly.  There is no JVD.    Lungs: clear.  The chest wall is non tender.    Heart: regular rate with  a normal S1 and S2.  There are no murmurs, gallops, or rubs. The PMI is not displaced.     Abdomen: good bowel sounds.  There is no guarding or rebound.  There is no hepatosplenomegaly or tenderness.  There are no masses.   Extremities:  no clubbing, cyanosis, or edema.  The legs are without rashes.  The distal pulses are intact.   Neuro:  Cranial nerves II - XII are intact.  Motor and sensory functions are intact.    The gait is normal.  Assessment / Plan:

## 2011-09-13 ENCOUNTER — Other Ambulatory Visit: Payer: Self-pay | Admitting: Cardiovascular Disease

## 2011-09-25 ENCOUNTER — Telehealth: Payer: Self-pay | Admitting: Cardiovascular Disease

## 2011-09-25 ENCOUNTER — Telehealth: Payer: Self-pay | Admitting: *Deleted

## 2011-09-25 MED ORDER — VALSARTAN 160 MG PO TABS: 160.0000 mg | ORAL_TABLET | Freq: Two times a day (BID) | ORAL | Status: DC

## 2011-09-25 NOTE — Telephone Encounter (Signed)
Pt taking bystolic and has had increased asthma symptoms for the past 10 days

## 2011-09-25 NOTE — Telephone Encounter (Signed)
Pt called back and wanted you to know she was trying to reduce her bystolic on her own by cutting the pill but wanted you to know her bp is up more( 155/94 p 46 ) now because of it. She just dont like how she feels on it and it keeps her awake at night and gives her vivid dreams, 2nd call please advise. Pt aware your not in office today.

## 2011-09-25 NOTE — Telephone Encounter (Signed)
Spoke with Dr Elease Hashimoto, pt to stop Bystolic, pt to take bp daily and use Diovan 160 mg BID on as needed basis but Dr thought she may need twice daily. Pt has good understanding and will call with questions or concerns, appointment made for f/u.

## 2011-09-25 NOTE — Telephone Encounter (Signed)
C/O tight chest, non productive cough x 5-6 days, asthma  med's not helping but don't feel like she is sick/ viral.  pt not been feeling well since starting  Bystolic 6/ 2012.  C/o pulse being low, feeling sluggish and has occ dizziness.( In OV note 07/18/11 bp 122/74, p 56.) Today bp 139/76, p 46. Pt would like to come off Bystolic, Please advise.

## 2011-09-28 ENCOUNTER — Telehealth: Payer: Self-pay | Admitting: Cardiovascular Disease

## 2011-09-28 NOTE — Telephone Encounter (Signed)
Pt thinks she needs to go back on bystolic and she wants to talk to you about it

## 2011-09-29 ENCOUNTER — Telehealth: Payer: Self-pay | Admitting: Physician Assistant

## 2011-09-29 NOTE — Telephone Encounter (Signed)
[name] [name] is a 66 y.o. female with CAD, HTN. She recently requested to come off Bystolic. She was told to increase her Diovan to 160 mg BID. Her pressure has been running high since. Today she went back on Bystolic and changed Diovan to QD. She checked it before calling and it was 180/81.  She feels anxious about her BP. No chest pain, dyspnea, syncope. No current headache or blurry vision or unilateral weakness.  No speech difficulties. I advised her to take another Diovan 160 mg now. She can check it again in 2-3 hours. If her BP is not coming down and she feels bad, she will go to the ED.  Please call on Monday and follow up.  She can be brought in for nurse visit early in the week for a BP check. Tereso Newcomer, PA-C

## 2011-10-01 MED ORDER — NEBIVOLOL HCL 5 MG PO TABS: 5.0000 mg | ORAL_TABLET | Freq: Every day | ORAL | Status: DC

## 2011-10-01 NOTE — Telephone Encounter (Signed)
Her BP was perfect when she was seen in August.  She was on Bystolic at that time.  Is she having side effects from the Bystolic?  I would suggest that she resume it to get her BP back to normal.  I can see her in the office in several weeks ( or she can see Lawson Fiscal).

## 2011-10-01 NOTE — Telephone Encounter (Signed)
Pt bp going high into 180's/ 90's, restarted bystolic/ advised by PA  and taking Diovan 160 mg/ bid, I noted them in [facility]. Pt to keep log of bp and dr Elease Hashimoto will review on scheduled app next week. Pt to call if needs to be seen sooner or has questions or concerns. Pt verbalized understanding. Alfonso Ramus RN

## 2011-10-02 NOTE — Telephone Encounter (Signed)
Have spoken several times with pt, I have another note due to pt called in to office twice on same subject/ spoke with Alfonso Ramus RN and Tereso Newcomer PA. Pt feeling better now with bp 123/65 p 60, pt taking diovan 160 mg BID and bystolic 5 mg in am. She has app 10/11/11 and is ok with that app. She is out shopping currently and told to call if needs [place] sooner, pt agreed.

## 2011-10-02 NOTE — Telephone Encounter (Signed)
Ok Scott Weaver, PA-C  

## 2011-10-02 NOTE — Telephone Encounter (Signed)
I think she came off the Bystolic due to low HR and feeling "sluggish."   Tereso Newcomer, PA-C

## 2011-10-11 ENCOUNTER — Ambulatory Visit (INDEPENDENT_AMBULATORY_CARE_PROVIDER_SITE_OTHER): Payer: Medicare Other | Admitting: Cardiovascular Disease

## 2011-10-11 ENCOUNTER — Encounter: Payer: Self-pay | Admitting: Cardiovascular Disease

## 2011-10-11 VITALS — BP 150/89 | HR 61 | Ht 60.0 in | Wt 112.1 lb

## 2011-10-11 DIAGNOSIS — I10 Essential (primary) hypertension: Secondary | ICD-10-CM

## 2011-10-11 DIAGNOSIS — E785 Hyperlipidemia, unspecified: Secondary | ICD-10-CM

## 2011-10-11 MED ORDER — ROSUVASTATIN CALCIUM 5 MG PO TABS: 2.5000 mg | ORAL_TABLET | Freq: Every day | ORAL | Status: DC

## 2011-10-11 NOTE — Patient Instructions (Signed)
Keep February app and have fasting labs one week prior.

## 2011-10-11 NOTE — Assessment & Plan Note (Signed)
And blood pressure is fairly well controlled. She brought her blood pressure log with her today. Most of her readings are in the normal range. Occasionally she has regained a little bit high. He's typically cause her to have lots of anxiety.  I told her that  it's okay to have blood pressure readings are slightly elevated. If we increase her blood pressure medicines too much Coumadin she'll have a episodes of hypotension and possible syncope.  I reassured her that this is safe. I'll see her again in February at her scheduled appointment. We'll draw fasting lipid on file the week before.

## 2011-10-11 NOTE — Progress Notes (Signed)
[name] Date of Birth  [birth year] [facility] [address]     [address] [place], [place]  [address] [phone]  Fax  [phone]  History of Present Illness:  [name] [name] is a 65 y.o. female with history of coronary artery disease.  She has a stent in her mid LAD - 07/05/2003 (3.0 x 20 mm Taxus deployed at 12 atm). She has a Hx of hypertension.  She was seen by [facility] Fiscal several months ago for some chest pain. A stress Myoview study was normal. She is seen today for her blood pressure.  [name] [name] takes her blood pressure multiple times through the day. She becomes very anxious if she gets reading that's elevated.  Current Outpatient Prescriptions on File Prior to Visit  Medication Sig Dispense Refill  . acetaminophen (TYLENOL) 325 MG tablet Take 650 mg by mouth as needed.        Marland Kitchen albuterol (PROAIR HFA) 108 (90 BASE) MCG/ACT inhaler Inhale 2 puffs into the lungs every 6 (six) hours as needed.        Marland Kitchen aspirin 81 MG tablet Take 81 mg by mouth daily. 3 times a week      . budesonide (PULMICORT FLEXHALER) 180 MCG/ACT inhaler Inhale 1 puff into the lungs 2 (two) times daily. Rinse mouth well after use.       . calcium carbonate (OS-CAL) 600 MG TABS Take 600 mg by mouth daily. 1Tablet Daily      . Cholecalciferol (VITAMIN D3 PO) Take 1,200 mg by mouth.        . clopidogrel (PLAVIX) 75 MG tablet Take 75 mg by mouth daily.        . Glucosamine-Chondroitin-MSM 500-200-150 MG TABS Take by mouth.        . Loratadine (CLARITIN) 10 MG CAPS Take by mouth daily as needed.        . Multiple Vitamins-Minerals (CENTRUM SILVER PO) Take by mouth daily.        . nebivolol (BYSTOLIC) 5 MG tablet Take 1 tablet (5 mg total) by mouth daily.  30 tablet    . nitroGLYCERIN (NITROLINGUAL) 0.4 MG/SPRAY spray Place 1 spray under the tongue every 5 (five) minutes as needed for chest pain.  4.9 g  12  . PREMARIN vaginal cream Use as directed       . Probiotic Product (ALIGN PO) Take  by mouth daily.        Marland Kitchen tiotropium (SPIRIVA HANDIHALER) 18 MCG inhalation capsule Place 18 mcg into inhaler and inhale daily.        Marland Kitchen trimethoprim (TRIMPEX) 100 MG tablet Take 1 tablet by mouth Daily.      Marland Kitchen DISCONTD: rosuvastatin (CRESTOR) 10 MG tablet Take 2.5 mg by mouth daily.       Marland Kitchen DISCONTD: valsartan (DIOVAN) 160 MG tablet Take 1 tablet (160 mg total) by mouth 2 (two) times daily.  60 tablet  1    Allergies  Allergen Reactions  . Lisinopril   . Simvastatin     Past Medical History  Diagnosis Date  . Allergic rhinitis   . Asthma   . CAD (coronary artery disease)   . MI (myocardial infarction) 2004    Anteroapical MI with PCI to the LAD  . Positive PPD   . Anxiety disorder   . Breast cancer     bilateral  . Hyperplastic colon polyp 10/16/99  . Hypertension   . Internal and external hemorrhoids without complication 11/13/04  .  Diverticulosis 11/13/04  . PVC (premature ventricular contraction)     Past Surgical History  Procedure Date  . Bilateral mastectomy   . Appendectomy   . Coronary angioplasty with stent placement 2004    LAD  . US echocardiography 03/27/2004    resting ef 60%  . Cardiovascular stress test 07/01/2006    ef 80%    History  Smoking status  . Former Smoker  . Quit date: 08/07/1966  Smokeless tobacco  . Never Used    History  Alcohol Use  . 1.0 oz/week  . 2 drink(s) per week    Family History  Problem Relation Age of Onset  . Breast cancer Mother   . Breast cancer Sister   . Heart disease Maternal Grandmother   . Colon cancer Neg Hx   . Asthma Daughter   . Spina bifida      Reviw of Systems:  Reviewed in the HPI.  All other systems are negative.  Physical Exam: BP 150/89  Pulse 61  Ht 5' (1.524 m)  Wt 112 lb 1.9 oz (50.857 kg)  BMI 21.90 kg/m2 The patient is alert and oriented x 3.  The mood and affect are normal.   Skin: warm and dry.  Color is normal.    HEENT:   the sclera are nonicteric.  The mucous membranes  are moist.  The carotids are 2+ without bruits.  There is no thyromegaly.  There is no JVD.    Lungs: clear.  The chest wall is non tender.    Heart: regular rate with a normal S1 and S2.  There are no murmurs, gallops, or rubs. The PMI is not displaced.     Abdomen: good bowel sounds.  There is no guarding or rebound.  There is no hepatosplenomegaly or tenderness.  There are no masses.   Extremities:  no clubbing, cyanosis, or edema.  The legs are without rashes.  The distal pulses are intact.   Neuro:  Cranial nerves II - XII are intact.  Motor and sensory functions are intact.    The gait is normal.  Assessment / Plan:

## 2012-01-15 ENCOUNTER — Other Ambulatory Visit (INDEPENDENT_AMBULATORY_CARE_PROVIDER_SITE_OTHER): Payer: Medicare Other | Admitting: *Deleted

## 2012-01-15 DIAGNOSIS — E785 Hyperlipidemia, unspecified: Secondary | ICD-10-CM

## 2012-01-15 LAB — HEPATIC FUNCTION PANEL
ALT: 15 U/L (ref 0–35)
AST: 22 U/L (ref 0–37)
Alkaline Phosphatase: 64 U/L (ref 39–117)
Bilirubin, Direct: 0.2 mg/dL (ref 0.0–0.3)
Total Bilirubin: 1.2 mg/dL (ref 0.3–1.2)

## 2012-01-15 LAB — BASIC METABOLIC PANEL
Calcium: 8.5 mg/dL (ref 8.4–10.5)
GFR: 70.85 mL/min (ref >60.00)
Glucose, Bld: 86 mg/dL (ref 70–99)
Sodium: 142 mEq/L (ref 135–145)

## 2012-01-15 LAB — LIPID PANEL
HDL: 51.5 mg/dL (ref >39.00)
Triglycerides: 47 mg/dL (ref 0.0–149.0)

## 2012-01-17 ENCOUNTER — Ambulatory Visit (INDEPENDENT_AMBULATORY_CARE_PROVIDER_SITE_OTHER): Payer: Medicare Other | Admitting: Cardiovascular Disease

## 2012-01-17 ENCOUNTER — Encounter: Payer: Self-pay | Admitting: Cardiovascular Disease

## 2012-01-17 DIAGNOSIS — I2510 Atherosclerotic heart disease of native coronary artery without angina pectoris: Secondary | ICD-10-CM

## 2012-01-17 MED ORDER — CLOPIDOGREL BISULFATE 75 MG PO TABS: 75.0000 mg | ORAL_TABLET | Freq: Every day | ORAL | Status: DC

## 2012-01-17 MED ORDER — ROSUVASTATIN CALCIUM 5 MG PO TABS: 5.0000 mg | ORAL_TABLET | Freq: Every day | ORAL | Status: DC

## 2012-01-17 MED ORDER — VALSARTAN 160 MG PO TABS: 160.0000 mg | ORAL_TABLET | Freq: Every day | ORAL | Status: DC

## 2012-01-17 NOTE — Progress Notes (Signed)
[name] [name] Date of Birth  1946-02-22 [facility]     [place]  [address]    [address]   [address] [place], [place]  [address]    [place], [place]  [address] [phone]  Fax  [phone]  [phone]  Fax [phone]  Problem list: 1.  Coronary artery disease-status post PTCA and stenting of her left anterior descending artery (August, 2004),  3.0 x 20 mm Taxus stent inflated up to 12 atmospheres.  2. Hyperlipidemia   History of Present Illness:  [name] [name] is doing well from a cardiac standpoint.  She has been active.  She denies any chest pain.  She has been under lots of stress from financial issues.  She will need to go back to work soon.  Current Outpatient Prescriptions on File Prior to Visit  Medication Sig Dispense Refill  . acetaminophen (TYLENOL) 325 MG tablet Take 650 mg by mouth as needed.        Marland Kitchen albuterol (PROAIR HFA) 108 (90 BASE) MCG/ACT inhaler Inhale 2 puffs into the lungs every 6 (six) hours as needed.        Marland Kitchen aspirin 81 MG tablet Take 81 mg by mouth daily. 3 times a week      . budesonide (PULMICORT FLEXHALER) 180 MCG/ACT inhaler Inhale 1 puff into the lungs 2 (two) times daily. Rinse mouth well after use.       . calcium carbonate (OS-CAL) 600 MG TABS Take 600 mg by mouth daily. 1Tablet Daily      . Cholecalciferol (VITAMIN D3 PO) Take 1,200 mg by mouth.        . clopidogrel (PLAVIX) 75 MG tablet Take 75 mg by mouth daily.        . fluticasone (VERAMYST) 27.5 MCG/SPRAY nasal spray Place 1 spray into the nose as needed.        . Glucosamine-Chondroitin-MSM 500-200-150 MG TABS Take by mouth.        . Loratadine (CLARITIN) 10 MG CAPS Take by mouth daily as needed.        . Multiple Vitamins-Minerals (CENTRUM SILVER PO) Take by mouth daily.        . nebivolol (BYSTOLIC) 5 MG tablet Take 1 tablet (5 mg total) by mouth daily.  30 tablet    . nitroGLYCERIN (NITROLINGUAL) 0.4 MG/SPRAY spray Place 1 spray under the tongue every 5 (five)  minutes as needed for chest pain.  4.9 g  12  . PREMARIN vaginal cream Use as directed       . Probiotic Product (ALIGN PO) Take by mouth daily.        . rosuvastatin (CRESTOR) 5 MG tablet Take 0.5 tablets (2.5 mg total) by mouth daily.  45 tablet  5  . tiotropium (SPIRIVA HANDIHALER) 18 MCG inhalation capsule Place 18 mcg into inhaler and inhale daily.        Marland Kitchen trimethoprim (TRIMPEX) 100 MG tablet Take 1 tablet by mouth Daily.      . valsartan (DIOVAN) 160 MG tablet Take 160 mg by mouth daily.          Allergies  Allergen Reactions  . Lisinopril   . Simvastatin     Past Medical History  Diagnosis Date  . Allergic rhinitis   . Asthma   . CAD (coronary artery disease)   . MI (myocardial infarction) 2004    Anteroapical MI with PCI to the LAD  . Positive PPD   . Anxiety disorder   .  Breast cancer     bilateral  . Hyperplastic colon polyp 10/16/99  . Hypertension   . Internal and external hemorrhoids without complication 11/13/04  . Diverticulosis 11/13/04  . PVC (premature ventricular contraction)     Past Surgical History  Procedure Date  . Bilateral mastectomy   . Appendectomy   . Coronary angioplasty with stent placement 2004    LAD  . US echocardiography 03/27/2004    resting ef 60%  . Cardiovascular stress test 07/01/2006    ef 80%    History  Smoking status  . Former Smoker  . Quit date: 08/07/1966  Smokeless tobacco  . Never Used    History  Alcohol Use  . 1.0 oz/week  . 2 drink(s) per week    Family History  Problem Relation Age of Onset  . Breast cancer Mother   . Breast cancer Sister   . Heart disease Maternal Grandmother   . Colon cancer Neg Hx   . Asthma Daughter   . Spina bifida      Reviw of Systems:  Reviewed in the HPI.  All other systems are negative.  Physical Exam: Blood pressure 147/78, pulse 55, height 5' (1.524 m), weight 114 lb 3.2 oz (51.801 kg). General: Well developed, well nourished, in no acute distress.  Head:  Normocephalic, atraumatic, sclera non-icteric, mucus membranes are moist,   Neck: Supple. Negative for carotid bruits. JVD not elevated.  Lungs: Clear bilaterally to auscultation without wheezes, rales, or rhonchi. Breathing is unlabored.  Heart: RRR with S1 S2. No murmurs, rubs, or gallops appreciated.  Abdomen: Soft, non-tender, non-distended with normoactive bowel sounds. No hepatomegaly. No rebound/guarding. No obvious abdominal masses.  Msk:  Strength and tone appear normal for age.  Extremities: No clubbing or cyanosis. No edema.  Distal pedal pulses are 2+ and equal bilaterally.  Neuro: Alert and oriented X 3. Moves all extremities spontaneously.  Psych:  Responds to questions appropriately with a normal affect.  ECG:  Assessment / Plan:

## 2012-01-17 NOTE — Assessment & Plan Note (Signed)
The patient has a history of coronary artery disease and status post PTCA and stenting of her mid LAD in 2004. She was recently seen by Lawson Fiscal in 2011  and had some episodes of chest discomfort. A repeat stress Myoview study revealed no evidence of ischemia and she had normal left ventricular systolic function. I do not think that her symptoms were related to coronary artery disease and I think that she is very stable.

## 2012-01-17 NOTE — Patient Instructions (Signed)
Your physician wants you to follow-up in: 6 MONTHS  You will receive a reminder letter in the mail two months in advance. If you don't receive a letter, please call our office to schedule the follow-up appointment.  Your physician recommends that you return for a FASTING lipid profile: 6 MONTHS   Your physician has recommended you make the following change in your medication:   REFILLED MEDS TODAY/// CHANGED CRESTOR TO READ HOW YOU ARE TAKING,

## 2012-04-18 ENCOUNTER — Other Ambulatory Visit: Payer: Self-pay | Admitting: Cardiovascular Disease

## 2012-04-21 NOTE — Telephone Encounter (Signed)
Fax Received. Refill Completed. [name] [name] (R.M.A)   

## 2012-05-26 ENCOUNTER — Ambulatory Visit: Payer: Medicare Other | Admitting: Internal Medicine

## 2012-07-21 ENCOUNTER — Other Ambulatory Visit (INDEPENDENT_AMBULATORY_CARE_PROVIDER_SITE_OTHER): Payer: Medicare Other

## 2012-07-21 DIAGNOSIS — E785 Hyperlipidemia, unspecified: Secondary | ICD-10-CM

## 2012-07-21 LAB — BASIC METABOLIC PANEL
BUN: 12 mg/dL (ref 6–23)
CO2: 30 mEq/L (ref 19–32)
Calcium: 8.5 mg/dL (ref 8.4–10.5)
Creatinine, Ser: 0.7 mg/dL (ref 0.4–1.2)
GFR: 83 mL/min (ref >60.00)
Glucose, Bld: 80 mg/dL (ref 70–99)

## 2012-07-21 LAB — HEPATIC FUNCTION PANEL
ALT: 17 U/L (ref 0–35)
AST: 23 U/L (ref 0–37)
Bilirubin, Direct: 0.2 mg/dL (ref 0.0–0.3)
Total Bilirubin: 0.9 mg/dL (ref 0.3–1.2)

## 2012-07-21 LAB — LIPID PANEL
HDL: 56.1 mg/dL (ref >39.00)
Total CHOL/HDL Ratio: 2

## 2012-07-22 ENCOUNTER — Telehealth: Payer: Self-pay | Admitting: Cardiovascular Disease

## 2012-07-22 NOTE — Telephone Encounter (Signed)
YES, 6 MO OV WAS REQUESTED. PT INFORMED

## 2012-07-22 NOTE — Telephone Encounter (Signed)
New problem:  Patient calling does she need to come in on Thursday

## 2012-07-24 ENCOUNTER — Ambulatory Visit (INDEPENDENT_AMBULATORY_CARE_PROVIDER_SITE_OTHER): Payer: Medicare Other | Admitting: Cardiovascular Disease

## 2012-07-24 ENCOUNTER — Encounter: Payer: Self-pay | Admitting: Cardiovascular Disease

## 2012-07-24 VITALS — BP 140/78 | HR 58 | Ht 60.0 in | Wt 112.0 lb

## 2012-07-24 DIAGNOSIS — I2510 Atherosclerotic heart disease of native coronary artery without angina pectoris: Secondary | ICD-10-CM

## 2012-07-24 NOTE — Assessment & Plan Note (Signed)
[name] [name]  is doing very well. She's not had any episodes of chest pain or shortness breath. She remains very active.  I'll see her again in 6 months for followup office visit, lipid profile

## 2012-07-24 NOTE — Patient Instructions (Addendum)
Your physician wants you to follow-up in: 6 MONTHS You will receive a reminder letter in the mail two months in advance. If you don't receive a letter, please call our office to schedule the follow-up appointment.  Your physician recommends that you return for a FASTING lipid profile: 6 MONTHS    

## 2012-07-24 NOTE — Progress Notes (Addendum)
[name] [name] Date of Birth  Mar 04, 1946 [facility]     [place]  [address]    [address]   [address] [place], [place]  [address]    [place], [place]  [address] [phone]  Fax  [phone]  [phone]  Fax [phone]  Problem list: 1.  Coronary artery disease-status post PTCA and stenting of her left anterior descending artery (August, 2004),  3.0 x 20 mm Taxus stent inflated up to 12 atmospheres.  2. Hyperlipidemia   History of Present Illness:  [name] [name] is doing well from a cardiac standpoint.  She has been active.   She has been under lots of stress from financial issues.  She has gone back to work and now works as an Environmental health practitioner for [employer]. She's not having episodes of chest pain or shortness of breath. She's been able to do all her normal activities including yard work without any problems.  Current Outpatient Prescriptions on File Prior to Visit  Medication Sig Dispense Refill  . acetaminophen (TYLENOL) 325 MG tablet Take 650 mg by mouth as needed.        Marland Kitchen albuterol (PROAIR HFA) 108 (90 BASE) MCG/ACT inhaler Inhale 2 puffs into the lungs every 6 (six) hours as needed.        Marland Kitchen aspirin 81 MG tablet Take 81 mg by mouth daily.       . budesonide (PULMICORT FLEXHALER) 180 MCG/ACT inhaler Inhale 1 puff into the lungs 2 (two) times daily. Rinse mouth well after use.       Marland Kitchen BYSTOLIC 5 MG tablet take 1 tablet by mouth once daily  30 tablet  6  . calcium carbonate (OS-CAL) 600 MG TABS Take 600 mg by mouth daily. 1Tablet Daily      . Cholecalciferol (VITAMIN D3 PO) Take 1,200 mg by mouth.        . clopidogrel (PLAVIX) 75 MG tablet Take 1 tablet (75 mg total) by mouth daily.  30 tablet  11  . fluticasone (VERAMYST) 27.5 MCG/SPRAY nasal spray Place 1 spray into the nose as needed.        . Glucosamine-Chondroitin-MSM 500-200-150 MG TABS Take by mouth.        . Loratadine (CLARITIN) 10 MG CAPS Take by mouth daily as needed.         . Multiple Vitamins-Minerals (CENTRUM SILVER PO) Take by mouth daily.        Marland Kitchen PREMARIN vaginal cream Use as directed       . rosuvastatin (CRESTOR) 5 MG tablet Take 1 tablet (5 mg total) by mouth daily.  30 tablet  5  . trimethoprim (TRIMPEX) 100 MG tablet Take 1 tablet by mouth Daily.      . valsartan (DIOVAN) 160 MG tablet Take 1 tablet (160 mg total) by mouth daily.  30 tablet  11    Allergies  Allergen Reactions  . Lisinopril   . Simvastatin     Past Medical History  Diagnosis Date  . Allergic rhinitis   . Asthma   . CAD (coronary artery disease)   . MI (myocardial infarction) 2004    Anteroapical MI with PCI to the LAD  . Positive PPD   . Anxiety disorder   . Breast cancer     bilateral  . Hyperplastic colon polyp 10/16/99  . Hypertension   . Internal and external hemorrhoids without complication 11/13/04  . Diverticulosis 11/13/04  . PVC (premature ventricular contraction)  Past Surgical History  Procedure Date  . Bilateral mastectomy   . Appendectomy   . Coronary angioplasty with stent placement 2004    LAD  . US echocardiography 03/27/2004    resting ef 60%  . Cardiovascular stress test 07/01/2006    ef 80%    History  Smoking status  . Former Smoker  . Quit date: 08/07/1966  Smokeless tobacco  . Never Used    History  Alcohol Use  . 1.0 oz/week  . 2 drink(s) per week    Family History  Problem Relation Age of Onset  . Breast cancer Mother   . Breast cancer Sister   . Heart disease Maternal Grandmother   . Colon cancer Neg Hx   . Asthma Daughter   . Spina bifida      Reviw of Systems:  Reviewed in the HPI.  All other systems are negative.  Physical Exam: Blood pressure 140/78, pulse 58, height 5' (1.524 m), weight 112 lb (50.803 kg). General: Well developed, well nourished, in no acute distress.  Head: Normocephalic, atraumatic, sclera non-icteric, mucus membranes are moist,   Neck: Supple. Negative for carotid bruits. JVD  not elevated.  Lungs: Clear bilaterally to auscultation without wheezes, rales, or rhonchi. Breathing is unlabored.  Heart: RRR with S1 S2. No murmurs, rubs, or gallops appreciated.  Abdomen: Soft, non-tender, non-distended with normoactive bowel sounds. No hepatomegaly. No rebound/guarding. No obvious abdominal masses.  Msk:  Strength and tone appear normal for age.  Extremities: No clubbing or cyanosis. No edema.  Distal pedal pulses are 2+ and equal bilaterally.  Neuro: Alert and oriented X 3. Moves all extremities spontaneously.  Psych:  Responds to questions appropriately with a normal affect.  ECG: 07/24/2012-sinus bradycardia with occasional premature ventricular contractions. She has nonspecific ST and T wave abnormalities. Assessment / Plan:

## 2012-10-17 ENCOUNTER — Other Ambulatory Visit: Payer: Self-pay | Admitting: Cardiovascular Disease

## 2012-10-17 MED ORDER — ROSUVASTATIN CALCIUM 5 MG PO TABS: 2.5000 mg | ORAL_TABLET | Freq: Every day | ORAL | Status: DC

## 2012-10-20 ENCOUNTER — Other Ambulatory Visit: Payer: Self-pay | Admitting: *Deleted

## 2012-10-20 MED ORDER — ROSUVASTATIN CALCIUM 5 MG PO TABS: 2.5000 mg | ORAL_TABLET | Freq: Every day | ORAL | Status: DC

## 2012-10-20 NOTE — Telephone Encounter (Signed)
Fax Received. Refill Completed. [name] [name] (R.M.A)   

## 2012-11-17 ENCOUNTER — Other Ambulatory Visit: Payer: Self-pay | Admitting: *Deleted

## 2012-11-17 MED ORDER — NEBIVOLOL HCL 5 MG PO TABS: 5.0000 mg | ORAL_TABLET | Freq: Every day | ORAL | Status: DC

## 2012-11-17 NOTE — Telephone Encounter (Signed)
Fax Received. Refill Completed. [name] [name] (R.M.A)   

## 2013-01-13 ENCOUNTER — Other Ambulatory Visit: Payer: Self-pay | Admitting: *Deleted

## 2013-01-13 DIAGNOSIS — I2510 Atherosclerotic heart disease of native coronary artery without angina pectoris: Secondary | ICD-10-CM

## 2013-01-13 DIAGNOSIS — I10 Essential (primary) hypertension: Secondary | ICD-10-CM

## 2013-01-13 DIAGNOSIS — E785 Hyperlipidemia, unspecified: Secondary | ICD-10-CM

## 2013-01-13 MED ORDER — ROSUVASTATIN CALCIUM 5 MG PO TABS: 5.0000 mg | ORAL_TABLET | Freq: Every day | ORAL | Status: AC

## 2013-01-13 MED ORDER — NEBIVOLOL HCL 5 MG PO TABS: 5.0000 mg | ORAL_TABLET | Freq: Every day | ORAL | Status: DC

## 2013-01-13 NOTE — Telephone Encounter (Signed)
Per Dr. Elease Hashimoto nurse to put in labs for patient. Fax Received. Refill Completed. Advika Mclelland Chowoe (R.M.A)

## 2013-01-13 NOTE — Telephone Encounter (Signed)
pharmacy called to wanting to verify medication dosage. called and left message on pt vm to call back with verification of her bystolic. pharmacy will take the medication off their fill until they get a call from office.

## 2013-01-14 ENCOUNTER — Other Ambulatory Visit: Payer: Self-pay | Admitting: *Deleted

## 2013-01-14 MED ORDER — CLOPIDOGREL BISULFATE 75 MG PO TABS: 75.0000 mg | ORAL_TABLET | Freq: Every day | ORAL | Status: DC

## 2013-01-14 NOTE — Telephone Encounter (Signed)
Fax Received. Refill Completed. [name] [name] (R.M.A)   

## 2013-02-05 ENCOUNTER — Other Ambulatory Visit: Payer: Self-pay | Admitting: *Deleted

## 2013-02-05 MED ORDER — VALSARTAN 160 MG PO TABS: 160.0000 mg | ORAL_TABLET | Freq: Every day | ORAL | Status: DC

## 2013-02-05 NOTE — Telephone Encounter (Signed)
Fax Received. Refill Completed. Emeree Mahler Chowoe (R.M.A)   

## 2013-02-09 ENCOUNTER — Other Ambulatory Visit: Payer: Self-pay | Admitting: *Deleted

## 2013-02-09 MED ORDER — VALSARTAN 160 MG PO TABS: 160.0000 mg | ORAL_TABLET | Freq: Every day | ORAL | Status: DC

## 2013-02-09 NOTE — Telephone Encounter (Signed)
Fax Received. Refill Completed. Octavia Chowoe (R.M.A)   

## 2013-02-11 ENCOUNTER — Other Ambulatory Visit (INDEPENDENT_AMBULATORY_CARE_PROVIDER_SITE_OTHER): Payer: Medicare Other

## 2013-02-11 DIAGNOSIS — E785 Hyperlipidemia, unspecified: Secondary | ICD-10-CM

## 2013-02-11 DIAGNOSIS — I2510 Atherosclerotic heart disease of native coronary artery without angina pectoris: Secondary | ICD-10-CM

## 2013-02-11 DIAGNOSIS — I10 Essential (primary) hypertension: Secondary | ICD-10-CM

## 2013-02-11 LAB — LIPID PANEL
Cholesterol: 108 mg/dL (ref 0–200)
LDL Cholesterol: 46 mg/dL (ref 0–99)
Total CHOL/HDL Ratio: 2
VLDL: 8.2 mg/dL (ref 0.0–40.0)

## 2013-02-11 LAB — HEPATIC FUNCTION PANEL
Albumin: 3.8 g/dL (ref 3.5–5.2)
Bilirubin, Direct: 0.2 mg/dL (ref 0.0–0.3)
Total Protein: 6.6 g/dL (ref 6.0–8.3)

## 2013-02-11 LAB — BASIC METABOLIC PANEL
BUN: 13 mg/dL (ref 6–23)
CO2: 33 mEq/L — ABNORMAL HIGH (ref 19–32)
Chloride: 105 mEq/L (ref 96–112)
GFR: 79.15 mL/min (ref >60.00)
Glucose, Bld: 88 mg/dL (ref 70–99)
Potassium: 3.8 mEq/L (ref 3.5–5.1)
Sodium: 142 mEq/L (ref 135–145)

## 2013-02-12 ENCOUNTER — Ambulatory Visit: Payer: Medicare Other | Admitting: Cardiovascular Disease

## 2013-02-17 ENCOUNTER — Ambulatory Visit (INDEPENDENT_AMBULATORY_CARE_PROVIDER_SITE_OTHER): Payer: Medicare Other | Admitting: Physician Assistant

## 2013-02-17 ENCOUNTER — Encounter: Payer: Self-pay | Admitting: Physician Assistant

## 2013-02-17 VITALS — BP 153/85 | HR 47 | Ht 60.0 in | Wt 116.0 lb

## 2013-02-17 DIAGNOSIS — E785 Hyperlipidemia, unspecified: Secondary | ICD-10-CM

## 2013-02-17 DIAGNOSIS — I10 Essential (primary) hypertension: Secondary | ICD-10-CM

## 2013-02-17 DIAGNOSIS — I2510 Atherosclerotic heart disease of native coronary artery without angina pectoris: Secondary | ICD-10-CM

## 2013-02-17 NOTE — Progress Notes (Signed)
[address], [address] [place], [place]  [address] Phone: [phone] Fax:  [phone]  Date:  02/17/2013   ID:  GABRIELLE WAKELAND, DOB December 29, 1945, MRN [id number]  PCP:  Gweneth Dimitri, MD  Primary Cardiologist:  Dr. Delane Ginger     History of Present Illness: [name] [name] is a 67 y.o. female who returns for routine follow up.  She has a hx of CAD, s/p Taxus DES to the LAD in the setting of a NSTEMI in 07/2003, HTN, HL, asthma, breast CA.  LHC 07/2003:  mLAD hazy 75% => Taxus DES, EF 30% with ant-apical, apical and inf-apical AK.  Echo 4/05: EF 60%.  ETT/Lexiscan Myoview 06/2011: EF 80%, no ischemia, normal wall motion. Last seen by Dr. Delane Ginger 07/2012 with plans for 6 mos f/u.  She is doing well.  No CP, SOB, syncope, orthopnea, PND, edema.  No palpitations.    Labs (3/11):  Hgb 13.9 Labs (3/14):  K 3.8, Creatinine 0.8, ALT 17, LDL 46   Wt Readings from Last 3 Encounters:  02/17/13 116 lb (52.617 kg)  07/24/12 112 lb (50.803 kg)  01/17/12 114 lb 3.2 oz (51.801 kg)     Past Medical History  Diagnosis Date  . Allergic rhinitis   . Asthma   . CAD (coronary artery disease)     a. NSTEMI 8/04 => LHC 07/2003:  mLAD hazy 75% => Taxus DES, EF 30% with ant-apical, apical and inf-apical AK.  b.  Echo 4/05: EF 60%.  c.  ETT/Lexiscan Myoview 06/2011: EF 80%, no ischemia, normal wall motion  . MI (myocardial infarction) 2004    Anteroapical MI with PCI to the LAD  . Positive PPD   . Anxiety disorder   . Breast cancer     bilateral  . Hyperplastic colon polyp 10/16/99  . Hypertension   . Internal and external hemorrhoids without complication 11/13/04  . Diverticulosis 11/13/04  . PVC (premature ventricular contraction)     Current Outpatient Prescriptions  Medication Sig Dispense Refill  . acetaminophen (TYLENOL) 325 MG tablet Take 650 mg by mouth as needed.        Marland Kitchen albuterol (PROAIR HFA) 108 (90 BASE) MCG/ACT inhaler Inhale 2 puffs into the lungs every 6 (six) hours  as needed.        Marland Kitchen aspirin 81 MG tablet Take 81 mg by mouth daily.       . budesonide (PULMICORT FLEXHALER) 180 MCG/ACT inhaler Inhale 1 puff into the lungs 2 (two) times daily. Rinse mouth well after use.       . calcium carbonate (OS-CAL) 600 MG TABS Take 600 mg by mouth daily. 1Tablet Daily      . Cholecalciferol (VITAMIN D3 PO) Take 1,200 mg by mouth.        . fluticasone (VERAMYST) 27.5 MCG/SPRAY nasal spray Place 1 spray into the nose as needed.        . Glucosamine-Chondroitin-MSM 500-200-150 MG TABS Take by mouth.        . Loratadine (CLARITIN) 10 MG CAPS Take by mouth daily as needed.        Marland Kitchen PREMARIN vaginal cream Use as directed       . rosuvastatin (CRESTOR) 5 MG tablet Take 1 tablet (5 mg total) by mouth daily.  30 tablet  6  . trimethoprim (TRIMPEX) 100 MG tablet Take 1 tablet by mouth Daily.      . clopidogrel (PLAVIX) 75 MG tablet Take 1 tablet (75 mg total) by  mouth daily.  30 tablet  6  . nebivolol (BYSTOLIC) 5 MG tablet Take 1 tablet (5 mg total) by mouth daily.  30 tablet  6  . valsartan (DIOVAN) 160 MG tablet Take 1 tablet (160 mg total) by mouth daily.  30 tablet  5   No current facility-administered medications for this visit.    Allergies:    Allergies  Allergen Reactions  . Lisinopril   . Simvastatin     Social History:  The patient  reports that she quit smoking about 46 years ago. She has never used smokeless tobacco. She reports that she drinks about 1.0 ounces of alcohol per week. She reports that she does not use illicit drugs.   ROS:  Please see the history of present illness.      All other systems reviewed and negative.   PHYSICAL EXAM: VS:  BP 153/85  Pulse 47  Ht 5' (1.524 m)  Wt 116 lb (52.617 kg)  BMI 22.65 kg/m2 Well nourished, well developed, in no acute distress HEENT: normal Neck: no JVD Vascular:  No carotid bruits Cardiac:  normal S1, S2; RRR; no murmur Lungs:  clear to auscultation bilaterally, no wheezing, rhonchi or rales Abd:  soft, nontender, no hepatomegaly Ext: no edema Skin: warm and dry Neuro:  CNs 2-12 intact, no focal abnormalities noted  EKG:  Sinus brady, HR 49, LAD, NSSTTW changes     ASSESSMENT AND PLAN:  1. Coronary Artery Disease:  Stable.  No angina.  Continue ASA, Plavix, statin. 2. Hypertension:  BP elevated.  She states she has been under a great deal of stress.  She would like to hold off on increasing medications for now.  She will monitor BPs at home and follow up with PCP in the next 1-2 mos. 3. Hyperlipidemia:  Controlled.  Continue Crestor. 4. Disposition:  Follow up with Dr. Delane Ginger in 1 year.   Luna Glasgow, PA-C  9:53 AM 02/17/2013

## 2013-02-17 NOTE — Patient Instructions (Addendum)
Your physician wants you to follow-up in: 1 year with Dr Elease Hashimoto.  You will receive a reminder letter in the mail two months in advance. If you don't receive a letter, please call our office to schedule the follow-up appointment.  Check your blood pressure at home and follow up with your primary physician regarding your blood pressure.

## 2013-06-15 ENCOUNTER — Telehealth: Payer: Self-pay | Admitting: Cardiovascular Disease

## 2013-06-15 NOTE — Telephone Encounter (Signed)
Will forward to Dr Elease Hashimoto to advise.

## 2013-06-15 NOTE — Telephone Encounter (Signed)
New problem   Sushmi/Pharmist [facility] is calling  regarding pt's  Plavix they done a test and the results came back negative. Please call Sushmi Pharamist

## 2013-06-16 NOTE — Telephone Encounter (Signed)
Pharmacist informed I will call back later today or tomorrow with an answer.  plavix needs to be changed/ oral swab test completed and came back negative, Would you like brilinta or effient RX?

## 2013-06-16 NOTE — Telephone Encounter (Signed)
Follow  Up  Sushmi from [facility] would like to speak with a nurse regarding a medication interaction.

## 2013-06-17 ENCOUNTER — Encounter: Payer: Self-pay | Admitting: *Deleted

## 2013-06-17 NOTE — Telephone Encounter (Signed)
Pt confirmed/ will stop plavix and continue asa 81 mg

## 2013-06-17 NOTE — Telephone Encounter (Signed)
Her stent was 10 years ago.  Can just go with asa.

## 2013-06-17 NOTE — Telephone Encounter (Signed)
msg left for pt and was asked to call back to confirm, pharmacy was also informed.

## 2013-12-02 ENCOUNTER — Other Ambulatory Visit: Payer: Self-pay

## 2013-12-02 MED ORDER — VALSARTAN 160 MG PO TABS: 160.0000 mg | ORAL_TABLET | Freq: Every day | ORAL | Status: DC

## 2014-02-09 ENCOUNTER — Emergency Department (HOSPITAL_COMMUNITY)
Admission: EM | Admit: 2014-02-09 | Discharge: 2014-02-09 | Disposition: A | Discharge status: Home / Self Care | Payer: Medicare Other | Attending: Emergency Medicine | Admitting: Emergency Medicine

## 2014-02-09 ENCOUNTER — Encounter (HOSPITAL_COMMUNITY): Payer: Self-pay | Admitting: Emergency Medicine

## 2014-02-09 DIAGNOSIS — Z860100 Personal history of colon polyps, unspecified: Secondary | ICD-10-CM | POA: Insufficient documentation

## 2014-02-09 DIAGNOSIS — I252 Old myocardial infarction: Secondary | ICD-10-CM | POA: Insufficient documentation

## 2014-02-09 DIAGNOSIS — S91009A Unspecified open wound, unspecified ankle, initial encounter: Principal | ICD-10-CM

## 2014-02-09 DIAGNOSIS — Z23 Encounter for immunization: Secondary | ICD-10-CM | POA: Insufficient documentation

## 2014-02-09 DIAGNOSIS — I2510 Atherosclerotic heart disease of native coronary artery without angina pectoris: Secondary | ICD-10-CM | POA: Insufficient documentation

## 2014-02-09 DIAGNOSIS — S8010XA Contusion of unspecified lower leg, initial encounter: Secondary | ICD-10-CM | POA: Insufficient documentation

## 2014-02-09 DIAGNOSIS — Z8659 Personal history of other mental and behavioral disorders: Secondary | ICD-10-CM | POA: Insufficient documentation

## 2014-02-09 DIAGNOSIS — S81809A Unspecified open wound, unspecified lower leg, initial encounter: Principal | ICD-10-CM

## 2014-02-09 DIAGNOSIS — Y9301 Activity, walking, marching and hiking: Secondary | ICD-10-CM | POA: Insufficient documentation

## 2014-02-09 DIAGNOSIS — W540XXA Bitten by dog, initial encounter: Secondary | ICD-10-CM | POA: Insufficient documentation

## 2014-02-09 DIAGNOSIS — Z7982 Long term (current) use of aspirin: Secondary | ICD-10-CM | POA: Insufficient documentation

## 2014-02-09 DIAGNOSIS — Z9861 Coronary angioplasty status: Secondary | ICD-10-CM | POA: Insufficient documentation

## 2014-02-09 DIAGNOSIS — Z79899 Other long term (current) drug therapy: Secondary | ICD-10-CM | POA: Insufficient documentation

## 2014-02-09 DIAGNOSIS — Z853 Personal history of malignant neoplasm of breast: Secondary | ICD-10-CM | POA: Insufficient documentation

## 2014-02-09 DIAGNOSIS — Z87891 Personal history of nicotine dependence: Secondary | ICD-10-CM | POA: Insufficient documentation

## 2014-02-09 DIAGNOSIS — Z8719 Personal history of other diseases of the digestive system: Secondary | ICD-10-CM | POA: Insufficient documentation

## 2014-02-09 DIAGNOSIS — I10 Essential (primary) hypertension: Secondary | ICD-10-CM | POA: Insufficient documentation

## 2014-02-09 DIAGNOSIS — J45909 Unspecified asthma, uncomplicated: Secondary | ICD-10-CM | POA: Insufficient documentation

## 2014-02-09 DIAGNOSIS — Y9289 Other specified places as the place of occurrence of the external cause: Secondary | ICD-10-CM | POA: Insufficient documentation

## 2014-02-09 DIAGNOSIS — S81852A Open bite, left lower leg, initial encounter: Secondary | ICD-10-CM

## 2014-02-09 DIAGNOSIS — S81009A Unspecified open wound, unspecified knee, initial encounter: Secondary | ICD-10-CM | POA: Insufficient documentation

## 2014-02-09 MED ORDER — AMOXICILLIN-POT CLAVULANATE 875-125 MG PO TABS: 1.0000 | ORAL_TABLET | Freq: Two times a day (BID) | ORAL | Status: DC

## 2014-02-09 MED ORDER — AMOXICILLIN-POT CLAVULANATE 875-125 MG PO TABS
1.0000 | ORAL_TABLET | Freq: Once | ORAL | Status: AC
  Administered 2014-02-09: 1 via ORAL
  Filled 2014-02-09: qty 1

## 2014-02-09 MED ORDER — RABIES VACCINE, PCEC IM SUSR
1.0000 mL | Freq: Once | INTRAMUSCULAR | Status: AC
  Administered 2014-02-09: 1 mL via INTRAMUSCULAR
  Filled 2014-02-09: qty 1

## 2014-02-09 MED ORDER — RABIES IMMUNE GLOBULIN 150 UNIT/ML IM INJ
20.0000 [IU]/kg | INJECTION | Freq: Once | INTRAMUSCULAR | Status: AC
  Administered 2014-02-09: 1050 [IU] via INTRAMUSCULAR
  Filled 2014-02-09: qty 8

## 2014-02-09 MED ORDER — HYDROCODONE-ACETAMINOPHEN 5-325 MG PO TABS: 1.0000 | ORAL_TABLET | ORAL | Status: DC | PRN

## 2014-02-09 NOTE — ED Notes (Signed)
Pt presents with 3 puncture wounds proximal to each other; reports being bitten by a dog, unaware of the vaccination/vet records of the dog. Mild active bleeding. Pt has NAD.

## 2014-02-09 NOTE — ED Provider Notes (Signed)
CSN: [id number]     Arrival date & time 02/09/14  2105 History  This chart was scribed for [name] Sams, PA, working with Dot Lanes, MD, by Elby Beck ED Scribe. This patient was seen in room [place] and the patient's care was started at 9:20 PM.    Chief Complaint  Patient presents with  . Animal Bite    The history is provided by the patient. No language interpreter was used.    HPI Comments: [name] [name] is a 68 y.o. female with a history of HTN , MI and CAD who presents to the Emergency Department complaining of a dog bite to her left lower leg that occurred earlier today. She states that the bite occurred while she was walking in her neighborhood, when a dog on a leash came up and bit her, without any provocation. She describes the dog as small and white, although she does not know what breed the dog was. She reports having 3 puncture wounds to her left lower leg, as well as some bruising. She states that she has rinsed her wound with Peroxide, but has not applied or taken any other medications. She states that she believes that the dog is UTD on immunizations, including rabies, although she is not sure. She reports that she takes 81 mg ASA, daily, but no other blood thinning medications. Pt reports that she believes her Tetanus vaccinations are UTD.    Past Medical History  Diagnosis Date  . Allergic rhinitis   . Asthma   . CAD (coronary artery disease)     a. NSTEMI 8/04 => LHC 07/2003:  mLAD hazy 75% => Taxus DES, EF 30% with ant-apical, apical and inf-apical AK.  b.  Echo 4/05: EF 60%.  c.  ETT/Lexiscan Myoview 06/2011: EF 80%, no ischemia, normal wall motion  . MI (myocardial infarction) 2004    Anteroapical MI with PCI to the LAD  . Positive PPD   . Anxiety disorder   . Breast cancer     bilateral  . Hyperplastic colon polyp 10/16/99  . Hypertension   . Internal and external hemorrhoids without complication 09/73/53  . Diverticulosis 11/13/04  . PVC (premature  ventricular contraction)    Past Surgical History  Procedure Laterality Date  . Bilateral mastectomy    . Appendectomy    . Coronary angioplasty with stent placement  2004    LAD  . US echocardiography  03/27/2004    resting ef 60%  . Cardiovascular stress test  07/01/2006    ef 80%   Family History  Problem Relation Age of Onset  . Breast cancer Mother   . Breast cancer Sister   . Heart disease Maternal Grandmother   . Colon cancer Neg Hx   . Asthma Daughter   . Spina bifida     History  Substance Use Topics  . Smoking status: Former Smoker    Quit date: 08/07/1966  . Smokeless tobacco: Never Used  . Alcohol Use: 1.0 oz/week    2 drink(s) per week   OB History   Grav Para Term Preterm Abortions TAB SAB Ect Mult Living                 Review of Systems  Skin: Positive for wound (dog bite, left lower leg).  All other systems reviewed and are negative.   Allergies  Lisinopril and Simvastatin  Home Medications   Current Outpatient Rx  Name  Route  Sig  Dispense  Refill  .  acetaminophen (TYLENOL) 325 MG tablet   Oral   Take 650 mg by mouth as needed.           Marland Kitchen albuterol (PROAIR HFA) 108 (90 BASE) MCG/ACT inhaler   Inhalation   Inhale 2 puffs into the lungs every 6 (six) hours as needed.           Marland Kitchen aspirin 81 MG tablet   Oral   Take 81 mg by mouth daily.          . budesonide (PULMICORT FLEXHALER) 180 MCG/ACT inhaler   Inhalation   Inhale 1 puff into the lungs 2 (two) times daily. Rinse mouth well after use.          . calcium carbonate (OS-CAL) 600 MG TABS   Oral   Take 600 mg by mouth daily. 1Tablet Daily         . Cholecalciferol (VITAMIN D3 PO)   Oral   Take 1,200 mg by mouth.           . fluticasone (VERAMYST) 27.5 MCG/SPRAY nasal spray   Nasal   Place 1 spray into the nose as needed.           . Glucosamine-Chondroitin-MSM 500-200-150 MG TABS   Oral   Take by mouth.           . Loratadine (CLARITIN) 10 MG CAPS   Oral    Take by mouth daily as needed.           . nebivolol (BYSTOLIC) 5 MG tablet   Oral   Take 1 tablet (5 mg total) by mouth daily.   30 tablet   6   . PREMARIN vaginal cream      Use as directed          . rosuvastatin (CRESTOR) 5 MG tablet   Oral   Take 1 tablet (5 mg total) by mouth daily.   30 tablet   6   . trimethoprim (TRIMPEX) 100 MG tablet   Oral   Take 1 tablet by mouth Daily.         . valsartan (DIOVAN) 160 MG tablet   Oral   Take 1 tablet (160 mg total) by mouth daily.   30 tablet   5    There were no vitals taken for this visit.  Physical Exam  Nursing note and vitals reviewed. Constitutional: She is oriented to person, place, and time. She appears well-developed and well-nourished. No distress.  HENT:  Head: Normocephalic and atraumatic.  Eyes: EOM are normal.  Neck: Neck supple. No tracheal deviation present.  Cardiovascular: Normal rate.   Pulmonary/Chest: Effort normal. No respiratory distress.  Musculoskeletal: Normal range of motion.  3 puncture wounds to the lateral left calf. There is a moderate size hematoma around the area. Small amount of oozing bleeding from the wounds. No foreign bodies seen or palpated. Normal range of motion of the foot and leg. Normal distal sensations and pulses.  Neurological: She is alert and oriented to person, place, and time.  Skin: Skin is warm and dry.  Psychiatric: She has a normal mood and affect. Her behavior is normal.    ED Course  Procedures   DIAGNOSTIC STUDIES: Oxygen Saturation is 96% on room air.    COORDINATION OF CARE: 9:25 PM- Pt advised of plan for treatment and pt agrees.  Wounds evaluated. No foreign bodies. Small amount of bleeding that is controlled. Underlying hematoma. Wound care provided with extensive irrigation. Wounds Band-Aids and  wrapped with Ace wrap. Patient advised to use elevation. Given the unknown status of the dog will initiate rabies vaccinations. Patient will continue to  followup with animal control.  If animals unvaccinated patient will continue rabies vaccine on the 13th, 17th and 24th.   Medications  rabies vaccine (RABAVERT) injection 1 mL (not administered)  rabies immune globulin (HYPERAB) injection 20 Units/kg (not administered)  amoxicillin-clavulanate (AUGMENTIN) 875-125 MG per tablet 1 tablet (not administered)    MDM   Final diagnoses:  Dog bite of left calf   I personally performed the services described in this documentation, which was scribed in my presence. The recorded information has been reviewed and is accurate.   Martie Lee, PA-C 02/09/14 2240

## 2014-02-09 NOTE — Discharge Instructions (Signed)
Keep your wounds clean and dry. Continue to followup with your primary care provider for continued evaluation and treatment of your injuries. Return to the emergency department for additional rabies vaccinations as discussed below. Return sooner for any concerns of infection or worsening symptoms. Keep your leg elevated to reduce swelling.    Animal Bite An animal bite can result in a scratch on the skin, deep open cut, puncture of the skin, crush injury, or tearing away of the skin or a body part. Dogs are responsible for most animal bites. Children are bitten more often than adults. An animal bite can range from very mild to more serious. A small bite from your house pet is no cause for alarm. However, some animal bites can become infected or injure a bone or other tissue. You must seek medical care if:  The skin is broken and bleeding does not slow down or stop after 15 minutes.  The puncture is deep and difficult to clean (such as a cat bite).  Pain, warmth, redness, or pus develops around the wound.  The bite is from a stray animal or rodent. There may be a risk of rabies infection.  The bite is from a snake, raccoon, skunk, fox, coyote, or bat. There may be a risk of rabies infection.  The person bitten has a chronic illness such as diabetes, liver disease, or cancer, or the person takes medicine that lowers the immune system.  There is concern about the location and severity of the bite. It is important to clean and protect an animal bite wound right away to prevent infection. Follow these steps:  Clean the wound with plenty of water and soap.  Apply an antibiotic cream.  Apply gentle pressure over the wound with a clean towel or gauze to slow or stop bleeding.  Elevate the affected area above the heart to help stop any bleeding.  Seek medical care. Getting medical care within 8 hours of the animal bite leads to the best possible outcome. DIAGNOSIS  Your caregiver will most  likely:  Take a detailed history of the animal and the bite injury.  Perform a wound exam.  Take your medical history. Blood tests or X-rays may be performed. Sometimes, infected bite wounds are cultured and sent to a lab to identify the infectious bacteria.  TREATMENT  Medical treatment will depend on the location and type of animal bite as well as the patient's medical history. Treatment may include:  Wound care, such as cleaning and flushing the wound with saline solution, bandaging, and elevating the affected area.  Antibiotics.  Tetanus immunization.  Rabies immunization.  Leaving the wound open to heal. This is often done with animal bites, due to the high risk of infection. However, in certain cases, wound closure with stitches, wound adhesive, skin adhesive strips, or staples may be used. Infected bites that are left untreated may require intravenous (IV) antibiotics and surgical treatment in the hospital. [place]  Follow your caregiver's instructions for wound care.  Take all medicines as directed.  If your caregiver prescribes antibiotics, take them as directed. Finish them even if you start to feel better.  Follow up with your caregiver for further exams or immunizations as directed. You may need a tetanus shot if:  You cannot remember when you had your last tetanus shot.  You have never had a tetanus shot.  The injury broke your skin. If you get a tetanus shot, your arm may swell, get red, and feel  warm to the touch. This is common and not a problem. If you need a tetanus shot and you choose not to have one, there is a rare chance of getting tetanus. Sickness from tetanus can be serious. SEEK MEDICAL CARE IF:  You notice warmth, redness, soreness, swelling, pus discharge, or a bad smell coming from the wound.  You have a red line on the skin coming from the wound.  You have a fever, chills, or a general ill feeling.  You have nausea or  vomiting.  You have continued or worsening pain.  You have trouble moving the injured part.  You have other questions or concerns. MAKE SURE YOU:  Understand these instructions.  Will watch your condition.  Will get help right away if you are not doing well or get worse. Document Released: 08/07/2011 Document Revised: 02/11/2012 Document Reviewed: 08/07/2011 [facility] Patient Information 2014 [place].                                     These are the dates that you need to return for your follow up on vaccines, the time frame lets [place] know when to possibly expect your arrival.   DAY 3: 02/12/2014  DAY 7: 02/16/2014  DAY 14: 02/23/2014

## 2014-02-22 NOTE — ED Provider Notes (Signed)
Medical screening examination/treatment/procedure(s) were performed by non-physician practitioner and as supervising physician I was immediately available for consultation/collaboration.   Karman Biswell L Garland Hincapie, MD 02/22/14 1110 

## 2014-03-02 ENCOUNTER — Encounter (INDEPENDENT_AMBULATORY_CARE_PROVIDER_SITE_OTHER): Payer: Self-pay | Admitting: Surgery

## 2014-03-02 ENCOUNTER — Ambulatory Visit (INDEPENDENT_AMBULATORY_CARE_PROVIDER_SITE_OTHER): Payer: Medicare Other | Admitting: Surgery

## 2014-03-02 VITALS — BP 140/88 | HR 70 | Temp 98.0°F | Resp 14 | Ht 60.0 in | Wt 116.8 lb

## 2014-03-02 DIAGNOSIS — T148 Other injury of unspecified body region: Secondary | ICD-10-CM

## 2014-03-02 DIAGNOSIS — S81852A Open bite, left lower leg, initial encounter: Secondary | ICD-10-CM | POA: Insufficient documentation

## 2014-03-02 DIAGNOSIS — W5581XA Bitten by other mammals, initial encounter: Secondary | ICD-10-CM

## 2014-03-02 DIAGNOSIS — S91009A Unspecified open wound, unspecified ankle, initial encounter: Secondary | ICD-10-CM

## 2014-03-02 DIAGNOSIS — S81009A Unspecified open wound, unspecified knee, initial encounter: Secondary | ICD-10-CM

## 2014-03-02 DIAGNOSIS — S81809A Unspecified open wound, unspecified lower leg, initial encounter: Secondary | ICD-10-CM

## 2014-03-02 MED ORDER — DOXYCYCLINE HYCLATE 50 MG PO CAPS: 100.0000 mg | ORAL_CAPSULE | Freq: Two times a day (BID) | ORAL | Status: DC

## 2014-03-02 NOTE — Patient Instructions (Signed)
Keep leg wrapped and elevated. Remove packing in 48 hours and cover with dry dressing.  Take antibiotics until gone. Ok to get wet.  Return 3 weeks.

## 2014-03-03 NOTE — Progress Notes (Signed)
Subjective:     Patient ID: [name] [name], female   DOB: 1946-11-05, 68 y.o.   MRN: [id number]  HPI Pt presents with 3 week hx of dog bite to LLE.  She was seen in ED where she got local wound care,  ABX and rabies vaccine.  The dog was immunized so that was stopped.  She has had a large hematoma that is slowly improving. She has had a lot of drainage over the weekend and is sen tby Dr Zenovia Jordan.No fever or chills.  The drainage appears to be old blood.  She feels well overall.   Review of Systems  Constitutional: Negative.   HENT: Negative.   Skin: Positive for wound.  Hematological: Negative.   Psychiatric/Behavioral: Negative.        Objective:   Physical Exam  Constitutional: She is oriented to person, place, and time. She appears well-developed and well-nourished.  Neurological: She is alert and oriented to person, place, and time.  Skin:     Psychiatric: She has a normal mood and affect. Her behavior is normal. Thought content normal.       Assessment:     Left lower extremity hematoma 3 weeks after dog bite.      Plan:     Recommend debridement today in office to help it heal.  Pt agreed with proceeding.  Under sterile conditions,  1 % lidocaine with epinephrine injected at superior most puncture wound.  The was unroofed with a scalpel and old hematoma expressed.  This was packed with 1/4 " plain gauze to be removed in 48 hours.  Keep wrapped and elevated when resting.  Will give 1 week of antibiotics of doxycycline 100 mg po BID. Return 3 weeks.  Total area of skin debrided was 1 cm x 2 cm.

## 2014-03-23 ENCOUNTER — Ambulatory Visit (INDEPENDENT_AMBULATORY_CARE_PROVIDER_SITE_OTHER): Payer: Medicare Other | Admitting: Surgery

## 2014-03-23 ENCOUNTER — Encounter (INDEPENDENT_AMBULATORY_CARE_PROVIDER_SITE_OTHER): Payer: Self-pay | Admitting: Surgery

## 2014-03-23 VITALS — BP 110/70 | HR 70 | Temp 97.6°F | Resp 12 | Ht 60.0 in | Wt 116.0 lb

## 2014-03-23 DIAGNOSIS — S81852A Open bite, left lower leg, initial encounter: Secondary | ICD-10-CM

## 2014-03-23 DIAGNOSIS — T148 Other injury of unspecified body region: Secondary | ICD-10-CM

## 2014-03-23 DIAGNOSIS — S91009A Unspecified open wound, unspecified ankle, initial encounter: Secondary | ICD-10-CM

## 2014-03-23 DIAGNOSIS — W5581XA Bitten by other mammals, initial encounter: Secondary | ICD-10-CM

## 2014-03-23 DIAGNOSIS — S81809A Unspecified open wound, unspecified lower leg, initial encounter: Secondary | ICD-10-CM

## 2014-03-23 DIAGNOSIS — S81009A Unspecified open wound, unspecified knee, initial encounter: Secondary | ICD-10-CM

## 2014-03-23 NOTE — Progress Notes (Signed)
Subjective:     Patient ID: [name] [name], female   DOB: [birth year], 68 y.o.   MRN: [id number]  HPI Pt presents with  week hx of dog bite to LLE.  She was seen in ED where she got local wound care,  ABX and rabies vaccine.  The dog was immunized so that was stopped.  She has had a large hematoma that is slowly improving. She has had a lot of drainage over the weekend and is sen by Dr Zenovia Jordan.No fever or chills.  It is much improved.    Review of Systems  Constitutional: Negative.   HENT: Negative.   Skin: Positive for wound.  Hematological: Negative.   Psychiatric/Behavioral: Negative.        Objective:   Physical Exam  Constitutional: She is oriented to person, place, and time. She appears well-developed and well-nourished.  Neurological: She is alert and oriented to person, place, and time.  Skin:  Left leg 5 mm wound no erythema min serous drainage.min swelling    Psychiatric: She has a normal mood and affect. Her behavior is normal. Thought content normal.       Assessment:     Left lower extremity hematoma 6 weeks after dog bite.      Plan:    Much improved. Continue to wrap. Follow up if not healed in 1 month.

## 2014-03-23 NOTE — Patient Instructions (Signed)
KEEP LEG WRAPPED WHEN AMBULATING. IF NOT HEALED IN ONE MONTH CALL.

## 2014-05-17 ENCOUNTER — Emergency Department (HOSPITAL_COMMUNITY): Payer: Medicare Other

## 2014-05-17 ENCOUNTER — Encounter (HOSPITAL_COMMUNITY): Payer: Self-pay | Admitting: Emergency Medicine

## 2014-05-17 ENCOUNTER — Emergency Department (HOSPITAL_COMMUNITY)
Admission: EM | Admit: 2014-05-17 | Discharge: 2014-05-17 | Disposition: A | Discharge status: Home / Self Care | Payer: Medicare Other | Attending: Emergency Medicine | Admitting: Emergency Medicine

## 2014-05-17 ENCOUNTER — Other Ambulatory Visit: Payer: Self-pay

## 2014-05-17 DIAGNOSIS — Z860100 Personal history of colon polyps, unspecified: Secondary | ICD-10-CM | POA: Insufficient documentation

## 2014-05-17 DIAGNOSIS — R0602 Shortness of breath: Secondary | ICD-10-CM

## 2014-05-17 DIAGNOSIS — J45901 Unspecified asthma with (acute) exacerbation: Secondary | ICD-10-CM | POA: Insufficient documentation

## 2014-05-17 DIAGNOSIS — IMO0002 Reserved for concepts with insufficient information to code with codable children: Secondary | ICD-10-CM | POA: Insufficient documentation

## 2014-05-17 DIAGNOSIS — Z79899 Other long term (current) drug therapy: Secondary | ICD-10-CM | POA: Insufficient documentation

## 2014-05-17 DIAGNOSIS — Z87891 Personal history of nicotine dependence: Secondary | ICD-10-CM | POA: Insufficient documentation

## 2014-05-17 DIAGNOSIS — R062 Wheezing: Secondary | ICD-10-CM

## 2014-05-17 DIAGNOSIS — I252 Old myocardial infarction: Secondary | ICD-10-CM | POA: Insufficient documentation

## 2014-05-17 DIAGNOSIS — M546 Pain in thoracic spine: Secondary | ICD-10-CM | POA: Insufficient documentation

## 2014-05-17 DIAGNOSIS — Z9861 Coronary angioplasty status: Secondary | ICD-10-CM | POA: Insufficient documentation

## 2014-05-17 DIAGNOSIS — Z7982 Long term (current) use of aspirin: Secondary | ICD-10-CM | POA: Insufficient documentation

## 2014-05-17 DIAGNOSIS — Z8719 Personal history of other diseases of the digestive system: Secondary | ICD-10-CM | POA: Insufficient documentation

## 2014-05-17 DIAGNOSIS — I10 Essential (primary) hypertension: Secondary | ICD-10-CM | POA: Insufficient documentation

## 2014-05-17 DIAGNOSIS — Z853 Personal history of malignant neoplasm of breast: Secondary | ICD-10-CM | POA: Insufficient documentation

## 2014-05-17 DIAGNOSIS — I2510 Atherosclerotic heart disease of native coronary artery without angina pectoris: Secondary | ICD-10-CM | POA: Insufficient documentation

## 2014-05-17 DIAGNOSIS — M549 Dorsalgia, unspecified: Secondary | ICD-10-CM

## 2014-05-17 LAB — BASIC METABOLIC PANEL
BUN: 14 mg/dL (ref 6–23)
CO2: 26 meq/L (ref 19–32)
Calcium: 8.9 mg/dL (ref 8.4–10.5)
Chloride: 103 mEq/L (ref 96–112)
Creatinine, Ser: 0.89 mg/dL (ref 0.50–1.10)
GFR calc Af Amer: 75 mL/min — ABNORMAL LOW (ref >90)
GFR calc non Af Amer: 65 mL/min — ABNORMAL LOW (ref >90)
GLUCOSE: 90 mg/dL (ref 70–99)
POTASSIUM: 4 meq/L (ref 3.7–5.3)
Sodium: 141 mEq/L (ref 137–147)

## 2014-05-17 LAB — CBC
HEMATOCRIT: 41.1 % (ref 36.0–46.0)
HEMOGLOBIN: 13.6 g/dL (ref 12.0–15.0)
MCH: 30.8 pg (ref 26.0–34.0)
MCHC: 33.1 g/dL (ref 30.0–36.0)
MCV: 93.2 fL (ref 78.0–100.0)
Platelets: 271 10*3/uL (ref 150–400)
RBC: 4.41 MIL/uL (ref 3.87–5.11)
RDW: 13.6 % (ref 11.5–15.5)
WBC: 5.9 10*3/uL (ref 4.0–10.5)

## 2014-05-17 LAB — I-STAT TROPONIN, ED: Troponin i, poc: 0 ng/mL (ref 0.00–0.08)

## 2014-05-17 LAB — PRO B NATRIURETIC PEPTIDE: Pro B Natriuretic peptide (BNP): 501.1 pg/mL — ABNORMAL HIGH (ref 0–125)

## 2014-05-17 MED ORDER — PREDNISONE 20 MG PO TABS: ORAL_TABLET | ORAL | Status: DC

## 2014-05-17 MED ORDER — METHYLPREDNISOLONE SODIUM SUCC 125 MG IJ SOLR
125.0000 mg | Freq: Once | INTRAMUSCULAR | Status: DC
  Filled 2014-05-17: qty 2

## 2014-05-17 MED ORDER — IPRATROPIUM-ALBUTEROL 0.5-2.5 (3) MG/3ML IN SOLN
3.0000 mL | RESPIRATORY_TRACT | Status: DC
  Administered 2014-05-17: 3 mL via RESPIRATORY_TRACT
  Filled 2014-05-17: qty 3

## 2014-05-17 MED ORDER — IOHEXOL 350 MG/ML SOLN
100.0000 mL | Freq: Once | INTRAVENOUS | Status: AC | PRN
  Administered 2014-05-17: 100 mL via INTRAVENOUS

## 2014-05-17 MED ORDER — FUROSEMIDE 20 MG PO TABS: 20.0000 mg | ORAL_TABLET | Freq: Every day | ORAL | Status: DC

## 2014-05-17 MED ORDER — PREDNISONE 20 MG PO TABS
40.0000 mg | ORAL_TABLET | Freq: Once | ORAL | Status: AC
  Administered 2014-05-17: 40 mg via ORAL
  Filled 2014-05-17: qty 2

## 2014-05-17 NOTE — ED Provider Notes (Signed)
CSN: [id number]     Arrival date & time 05/17/14  1328 History   None    Chief Complaint  Patient presents with  . Back Pain  . Hypertension  . Shortness of Breath     (Consider location/radiation/quality/duration/timing/severity/associated sxs/prior Treatment) Patient is a 68 y.o. female presenting with back pain.  Back Pain Location:  Thoracic spine Quality:  Stabbing Radiates to:  Does not radiate Pain severity:  Severe Onset quality:  Sudden Duration: 30 seconds. Timing:  Intermittent Progression:  Resolved Chronicity:  New Relieved by:  None tried Worsened by:  Nothing tried Ineffective treatments:  None tried Associated symptoms: no abdominal pain, no chest pain, no dysuria, no fever, no headaches, no leg pain, no numbness, no paresthesias, no pelvic pain, no tingling and no weakness   Risk factors: no hx of cancer, no hx of osteoporosis, not obese and no vascular disease     Past Medical History  Diagnosis Date  . Allergic rhinitis   . Asthma   . CAD (coronary artery disease)     a. NSTEMI 8/04 => LHC 07/2003:  mLAD hazy 75% => Taxus DES, EF 30% with ant-apical, apical and inf-apical AK.  b.  Echo 4/05: EF 60%.  c.  ETT/Lexiscan Myoview 06/2011: EF 80%, no ischemia, normal wall motion  . MI (myocardial infarction) 2004    Anteroapical MI with PCI to the LAD  . Positive PPD   . Anxiety disorder   . Breast cancer     bilateral  . Hyperplastic colon polyp 10/16/99  . Hypertension   . Internal and external hemorrhoids without complication 55/37/48  . Diverticulosis 11/13/04  . PVC (premature ventricular contraction)    Past Surgical History  Procedure Laterality Date  . Bilateral mastectomy    . Appendectomy    . Coronary angioplasty with stent placement  2004    LAD  . US echocardiography  03/27/2004    resting ef 60%  . Cardiovascular stress test  07/01/2006    ef 80%   Family History  Problem Relation Age of Onset  . Breast cancer Mother   . Breast  cancer Sister   . Heart disease Maternal Grandmother   . Colon cancer Neg Hx   . Asthma Daughter   . Spina bifida     History  Substance Use Topics  . Smoking status: Former Smoker    Quit date: 08/07/1966  . Smokeless tobacco: Never Used  . Alcohol Use: 1.0 oz/week    2 drink(s) per week   OB History   Grav Para Term Preterm Abortions TAB SAB Ect Mult Living                 Review of Systems  Constitutional: Negative for fever and activity change.  HENT: Negative for congestion and facial swelling.   Eyes: Negative for discharge and redness.  Respiratory: Positive for shortness of breath. Negative for cough.   Cardiovascular: Negative for chest pain and palpitations.  Gastrointestinal: Negative for abdominal pain and abdominal distention.  Endocrine: Negative for polydipsia and polyuria.  Genitourinary: Negative for dysuria, menstrual problem and pelvic pain.  Musculoskeletal: Positive for back pain. Negative for joint swelling.  Skin: Negative for color change and wound.  Neurological: Negative for dizziness, tingling, weakness, light-headedness, numbness, headaches and paresthesias.      Allergies  Codeine; Lisinopril; and Simvastatin  Home Medications   Prior to Admission medications   Medication Sig Start Date End Date Taking? Authorizing Provider  acetaminophen (TYLENOL)  325 MG tablet Take 650 mg by mouth every 6 (six) hours as needed for mild pain.    Yes Historical Provider, MD  albuterol (PROAIR HFA) 108 (90 BASE) MCG/ACT inhaler Inhale 2 puffs into the lungs every 6 (six) hours as needed for shortness of breath.    Yes Historical Provider, MD  aspirin 81 MG tablet Take 81 mg by mouth daily.    Yes Historical Provider, MD  budesonide (PULMICORT FLEXHALER) 180 MCG/ACT inhaler Inhale 1 puff into the lungs 2 (two) times daily. Rinse mouth well after use.    Yes Historical Provider, MD  calcium carbonate (OS-CAL) 600 MG TABS Take 600 mg by mouth daily. 1Tablet Daily    Yes Historical Provider, MD  Cholecalciferol (VITAMIN D) 2000 UNITS tablet Take 4,000 Units by mouth daily.   Yes Historical Provider, MD  fluticasone (FLONASE) 50 MCG/ACT nasal spray Place 1 spray into both nostrils daily as needed for allergies.  01/08/14  Yes Historical Provider, MD  Loratadine (CLARITIN) 10 MG CAPS Take 10 mg by mouth daily as needed. For seasonal allergies   Yes Historical Provider, MD  nebivolol (BYSTOLIC) 5 MG tablet Take 1 tablet (5 mg total) by mouth daily. 01/13/13  Yes Thayer Headings, MD  PREMARIN vaginal cream Place 0.5 Applicatorfuls vaginally 2 (two) times a week. On Tuesday and Saturday     Use as directed 05/03/11  Yes Historical Provider, MD  rosuvastatin (CRESTOR) 5 MG tablet Take 1 tablet (5 mg total) by mouth daily. 01/13/13  Yes Thayer Headings, MD  Teriparatide, Recombinant, (FORTEO) 600 MCG/2.4ML SOLN Inject 20 mcg into the skin every evening.   Yes Historical Provider, MD  valsartan (DIOVAN) 160 MG tablet Take 1 tablet (160 mg total) by mouth daily. 12/02/13  Yes Thayer Headings, MD  furosemide (LASIX) 20 MG tablet Take 1 tablet (20 mg total) by mouth daily. 05/17/14   Merrily Pew, MD  predniSONE (DELTASONE) 20 MG tablet 2 tabs po daily x 3 days 05/17/14   Merrily Pew, MD   BP 150/78  Pulse 59  Temp(Src) 98.1 F (36.7 C) (Oral)  Resp 12  Wt 111 lb (50.349 kg)  SpO2 97% Physical Exam  Nursing note and vitals reviewed. Constitutional: She is oriented to person, place, and time. She appears well-developed and well-nourished.  HENT:  Head: Normocephalic and atraumatic.  Eyes: Conjunctivae and EOM are normal. Right eye exhibits no discharge. Left eye exhibits no discharge.  Cardiovascular: Normal rate and regular rhythm.   Pulmonary/Chest: Effort normal. No respiratory distress. She has wheezes.  Abdominal: Soft. She exhibits no distension. There is no tenderness. There is no rebound.  Musculoskeletal: Normal range of motion. She exhibits no edema and no  tenderness.  Neurological: She is alert and oriented to person, place, and time.  Skin: Skin is warm and dry.    ED Course  Procedures (including critical care time) Labs Review Labs Reviewed  BASIC METABOLIC PANEL - Abnormal; Notable for the following:    GFR calc non Af Amer 65 (*)    GFR calc Af Amer 75 (*)    All other components within normal limits  PRO B NATRIURETIC PEPTIDE - Abnormal; Notable for the following:    Pro B Natriuretic peptide (BNP) 501.1 (*)    All other components within normal limits  CBC  I-STAT TROPOININ, ED    Imaging Review Dg Chest Portable 1 View  05/17/2014   CLINICAL DATA:  Hypertension and difficulty breathing  EXAM: PORTABLE  CHEST - 1 VIEW  COMPARISON:  June 19, 2011  FINDINGS: There is no edema or consolidation. Heart size and pulmonary vascularity are normal. No adenopathy. No pneumothorax. No bone lesions.  IMPRESSION: No edema or consolidation.   Electronically Signed   By: Lowella Grip M.D.   On: 05/17/2014 15:05   Ct Angio Chest Aorta W/cm &/or Wo/cm  05/17/2014   CLINICAL DATA:  Back pain.  Hypertension.  EXAM: CT ANGIOGRAPHY CHEST, ABDOMEN AND PELVIS  TECHNIQUE: Multidetector CT imaging through the chest, abdomen and pelvis was performed using the standard protocol during bolus administration of intravenous contrast. Multiplanar reconstructed images and MIPs were obtained and reviewed to evaluate the vascular anatomy.  CONTRAST:  124mL OMNIPAQUE IOHEXOL 350 MG/ML SOLN  COMPARISON:  None.  FINDINGS: CTA CHEST FINDINGS  Heart is borderline enlarged. Coronary artery stent in the left anterior descending coronary artery. No evidence of thoracic aortic aneurysm or dissection. Great vessels are patent. Maximum diameter is in the ascending thoracic aorta at 3.2 cm.  No mediastinal, hilar, or axillary adenopathy.  Linear platelike atelectasis or scarring in the left lower lobe. Bibasilar scarring. Mild emphysema changes. No pleural effusions. Chest wall  soft tissues are unremarkable.  Review of the MIP images confirms the above findings.  CTA ABDOMEN AND PELVIS FINDINGS  Aorta and iliac vessels are normal caliber. No dissection. Mesenteric vessels and single renal arteries are widely patent.  Liver, gallbladder, spleen, pancreas, adrenals and kidneys are unremarkable. Prior hysterectomy. No adnexal masses. Urinary bladder unremarkable.  Moderate stool throughout the colon. Descending colonic and sigmoid diverticulosis. No active diverticulitis. Small bowel is decompressed.  No acute bony abnormality or focal bone lesion. Degenerative disc disease and postoperative changes at L5-S1.  Review of the MIP images confirms the above findings.  IMPRESSION: No evidence of aortic aneurysm or dissection.  Borderline cardiomegaly. Coronary artery disease with left anterior descending coronary stent noted.  Bibasilar scarring.  Mild hyperinflation/COPD.  No acute findings in the chest, abdomen or pelvis.   Electronically Signed   By: Rolm Baptise M.D.   On: 05/17/2014 18:31   Ct Angio Abd/pel W/ And/or W/o  05/17/2014   CLINICAL DATA:  Back pain.  Hypertension.  EXAM: CT ANGIOGRAPHY CHEST, ABDOMEN AND PELVIS  TECHNIQUE: Multidetector CT imaging through the chest, abdomen and pelvis was performed using the standard protocol during bolus administration of intravenous contrast. Multiplanar reconstructed images and MIPs were obtained and reviewed to evaluate the vascular anatomy.  CONTRAST:  175mL OMNIPAQUE IOHEXOL 350 MG/ML SOLN  COMPARISON:  None.  FINDINGS: CTA CHEST FINDINGS  Heart is borderline enlarged. Coronary artery stent in the left anterior descending coronary artery. No evidence of thoracic aortic aneurysm or dissection. Great vessels are patent. Maximum diameter is in the ascending thoracic aorta at 3.2 cm.  No mediastinal, hilar, or axillary adenopathy.  Linear platelike atelectasis or scarring in the left lower lobe. Bibasilar scarring. Mild emphysema changes. No  pleural effusions. Chest wall soft tissues are unremarkable.  Review of the MIP images confirms the above findings.  CTA ABDOMEN AND PELVIS FINDINGS  Aorta and iliac vessels are normal caliber. No dissection. Mesenteric vessels and single renal arteries are widely patent.  Liver, gallbladder, spleen, pancreas, adrenals and kidneys are unremarkable. Prior hysterectomy. No adnexal masses. Urinary bladder unremarkable.  Moderate stool throughout the colon. Descending colonic and sigmoid diverticulosis. No active diverticulitis. Small bowel is decompressed.  No acute bony abnormality or focal bone lesion. Degenerative disc disease and postoperative changes at  L5-S1.  Review of the MIP images confirms the above findings.  IMPRESSION: No evidence of aortic aneurysm or dissection.  Borderline cardiomegaly. Coronary artery disease with left anterior descending coronary stent noted.  Bibasilar scarring.  Mild hyperinflation/COPD.  No acute findings in the chest, abdomen or pelvis.   Electronically Signed   By: Rolm Baptise M.D.   On: 05/17/2014 18:31     EKG Interpretation   Date/Time:  Monday May 17 2014 13:38:37 EDT Ventricular Rate:  56 PR Interval:  166 QRS Duration: 82 QT Interval:  446 QTC Calculation: 430 R Axis:   32 Text Interpretation:  Sinus bradycardia Septal infarct , age undetermined  Abnormal ECG Confirmed by Christy Gentles  MD, DONALD ([id number]) on 05/17/2014  2:10:49 PM      MDM   Final diagnoses:  SOB (shortness of breath)  Back pain    68 yo F w/ h/o MI, HTN, here with a couple episodes of sharp stabbing pain to her mid back on Saturday while at work. Also with hypertension since then. No neuro symptoms. Some sob worsened since that time not relieved by albuterol at home. No fevers, minimal cough. Exam with wheezing throughout lungs. Neuro unremarkable. Intact equal distal pulses.  ECG w/ sinus brady and old septal infarct. No chest pain or symptoms similar to her MI, initial troponin  negative and no pain for >48 hours, doubt MI. Secondary to worsening hypertension and sharp pains in back, concern for aortic dissection so ct scan ordered and negative.  Duo nebs improved patients dyspnea and wheezing, likely 2/2 asthma exacerbation, steroids given and rx for same at home.  Also started on lasix for a short course as she has JVD, slightly elevated pro BNP,  With a history of CHF. Will follow up with cardiology this week for that.     Merrily Pew, MD 05/18/14 [id number]

## 2014-05-17 NOTE — ED Provider Notes (Signed)
I saw and evaluated the patient, reviewed the resident's note and I agree with the findings and plan.   EKG Interpretation   Date/Time:  Monday May 17 2014 13:38:37 EDT Ventricular Rate:  56 PR Interval:  166 QRS Duration: 82 QT Interval:  446 QTC Calculation: 430 R Axis:   32 Text Interpretation:  Sinus bradycardia Septal infarct , age undetermined  Abnormal ECG Confirmed by Christy Gentles  MD, DONALD ([id number]) on 05/17/2014  2:10:49 PM     2 episodes sudden sharp stabbing posterior mid back pain without radiation or associated symptoms 2 days ago; minimal end expiratory wheezes after nebulizer patient feels much better states her asthma wheezing is much better  Babette Relic, MD 05/20/14 [phone]

## 2014-05-17 NOTE — ED Notes (Signed)
Pt states that she began having back pain on Saturday afternoon while at work. Pt states that she has had a past MI with back pain that radiated to her neck. Pt states that this pain is similar. Pt also noticed that her Bp was elevated. Pt states that she does not have pain at this time. Pain was intermittent lasting only a few seconds at a time. Pt states that she had 2 episodes of the pain on Saturday. Pt states that she feels tired.

## 2014-05-17 NOTE — ED Notes (Signed)
Pt ambulates with steady gait to bathroom denies any complaints at this time.

## 2014-05-24 ENCOUNTER — Encounter: Payer: Self-pay | Admitting: Nurse Practitioner

## 2014-05-24 ENCOUNTER — Telehealth: Payer: Self-pay | Admitting: *Deleted

## 2014-05-24 ENCOUNTER — Other Ambulatory Visit: Payer: Self-pay | Admitting: *Deleted

## 2014-05-24 ENCOUNTER — Ambulatory Visit (INDEPENDENT_AMBULATORY_CARE_PROVIDER_SITE_OTHER): Payer: Medicare Other | Admitting: Nurse Practitioner

## 2014-05-24 VITALS — BP 160/100 | HR 54 | Ht 60.0 in | Wt 114.0 lb

## 2014-05-24 DIAGNOSIS — I2510 Atherosclerotic heart disease of native coronary artery without angina pectoris: Secondary | ICD-10-CM

## 2014-05-24 DIAGNOSIS — I10 Essential (primary) hypertension: Secondary | ICD-10-CM

## 2014-05-24 DIAGNOSIS — I429 Cardiomyopathy, unspecified: Secondary | ICD-10-CM

## 2014-05-24 LAB — BASIC METABOLIC PANEL
BUN: 18 mg/dL (ref 6–23)
CO2: 36 mEq/L — ABNORMAL HIGH (ref 19–32)
Calcium: 9 mg/dL (ref 8.4–10.5)
Chloride: 99 mEq/L (ref 96–112)
Creatinine, Ser: 0.9 mg/dL (ref 0.4–1.2)
GFR: 67.59 mL/min (ref >60.00)
Glucose, Bld: 74 mg/dL (ref 70–99)
Potassium: 3.9 mEq/L (ref 3.5–5.1)
Sodium: 139 mEq/L (ref 135–145)

## 2014-05-24 MED ORDER — HYDROCHLOROTHIAZIDE 25 MG PO TABS: 25.0000 mg | ORAL_TABLET | Freq: Every day | ORAL | Status: DC

## 2014-05-24 NOTE — Addendum Note (Signed)
Addended by: Eulis Foster on: 05/24/2014 10:01 AM   Modules accepted: Orders

## 2014-05-24 NOTE — Telephone Encounter (Signed)
Left message on machine for pt to contact the office to schedule echo and ov for 3 weeks

## 2014-05-24 NOTE — Addendum Note (Signed)
Addended by: Eulis Foster on: 05/24/2014 10:02 AM   Modules accepted: Orders

## 2014-05-24 NOTE — Patient Instructions (Signed)
Stop Lasix  Start HCTZ 25 mg a day  Echo  Lab today  See 3 weeks

## 2014-05-24 NOTE — Telephone Encounter (Signed)
Please put in order for ECHO.

## 2014-05-24 NOTE — Progress Notes (Signed)
[name] [name] Date of Birth: 08-Feb-1946 Medical Record #[id number]  History of Present Illness: [name] [name] is seen back today for a work in [place] ER visit. Seen for Dr. Acie Fredrickson. She has known CAD with Taxus DES to the LAD in the setting of NSTEMI in August of 2004, HTN, HLD, asthma, breast cancer and anxiety. Last cath in 2004. Last echo in 2005 with normal EF. Myoview from 2012 with no ischemia and normal EF at 80%.  Last here in March of 2014. Was doing ok.  Comes in today. Here alone. Was in the ER earlier this month. Felt "awful" and thought it was her heart. Negative evaluation per her report. Given Lasix IV and sent home with oral dose for 10 days. Also treated for her asthma. Negative CT scan. BP quite up. Under lots of stress with family and job. No chest pain. Not short of breath. No swelling. Tries to watch her salt. Does not like taking her Bystolic - makes her fatigued. Admits she had gotten pretty slack with checking her BP but typically it has been ok with her PCP.   Current Outpatient Prescriptions  Medication Sig Dispense Refill  . acetaminophen (TYLENOL) 325 MG tablet Take 650 mg by mouth every 6 (six) hours as needed for mild pain.       Marland Kitchen albuterol (PROAIR HFA) 108 (90 BASE) MCG/ACT inhaler Inhale 2 puffs into the lungs every 6 (six) hours as needed for shortness of breath.       Marland Kitchen aspirin 81 MG tablet Take 81 mg by mouth daily.       . budesonide (PULMICORT FLEXHALER) 180 MCG/ACT inhaler Inhale 1 puff into the lungs 2 (two) times daily. Rinse mouth well after use.       . calcium carbonate (OS-CAL) 600 MG TABS Take 600 mg by mouth daily. 1Tablet Daily      . Cholecalciferol (VITAMIN D) 2000 UNITS tablet Take 4,000 Units by mouth daily.      . fluticasone (FLONASE) 50 MCG/ACT nasal spray Place 1 spray into both nostrils daily as needed for allergies.       . furosemide (LASIX) 20 MG tablet Take 1 tablet (20 mg total) by mouth daily.  10 tablet  0  . Loratadine  (CLARITIN) 10 MG CAPS Take 10 mg by mouth daily as needed. For seasonal allergies      . nebivolol (BYSTOLIC) 5 MG tablet Take 1 tablet (5 mg total) by mouth daily.  30 tablet  6  . PREMARIN vaginal cream Place 0.5 Applicatorfuls vaginally 2 (two) times a week. On Tuesday and Saturday     Use as directed      . rosuvastatin (CRESTOR) 5 MG tablet Take 1 tablet (5 mg total) by mouth daily.  30 tablet  6  . valsartan (DIOVAN) 160 MG tablet Take 1 tablet (160 mg total) by mouth daily.  30 tablet  5  . predniSONE (DELTASONE) 20 MG tablet 2 tabs po daily x 3 days  6 tablet  0  . Teriparatide, Recombinant, (FORTEO) 600 MCG/2.4ML SOLN Inject 20 mcg into the skin every evening.       No current facility-administered medications for this visit.    Allergies  Allergen Reactions  . Codeine Other (See Comments)    headache  . Lisinopril Cough  . Simvastatin Diarrhea    Past Medical History  Diagnosis Date  . Allergic rhinitis   . Asthma   . CAD (coronary artery disease)  a. NSTEMI 8/04 => LHC 07/2003:  mLAD hazy 75% => Taxus DES, EF 30% with ant-apical, apical and inf-apical AK.  b.  Echo 4/05: EF 60%.  c.  ETT/Lexiscan Myoview 06/2011: EF 80%, no ischemia, normal wall motion  . MI (myocardial infarction) 2004    Anteroapical MI with PCI to the LAD  . Positive PPD   . Anxiety disorder   . Breast cancer     bilateral  . Hyperplastic colon polyp 10/16/99  . Hypertension   . Internal and external hemorrhoids without complication 57/01/77  . Diverticulosis 11/13/04  . PVC (premature ventricular contraction)     Past Surgical History  Procedure Laterality Date  . Bilateral mastectomy    . Appendectomy    . Coronary angioplasty with stent placement  2004    LAD  . US echocardiography  03/27/2004    resting ef 60%  . Cardiovascular stress test  07/01/2006    ef 80%    History  Smoking status  . Former Smoker  . Quit date: 08/07/1966  Smokeless tobacco  . Never Used    History    Alcohol Use  . 1.0 oz/week  . 2 drink(s) per week    Family History  Problem Relation Age of Onset  . Breast cancer Mother   . Breast cancer Sister   . Heart disease Maternal Grandmother   . Colon cancer Neg Hx   . Asthma Daughter   . Spina bifida      Review of Systems: The review of systems is per the HPI.  All other systems were reviewed and are negative.  Physical Exam: BP 160/100  Pulse 54  Ht 5' (1.524 m)  Wt 114 lb (51.71 kg)  BMI 22.26 kg/m2  SpO2 99% Repeat BP is 160/100 in both arms.  Patient is very pleasant and in no acute distress. Skin is warm and dry. Color is normal.  HEENT is unremarkable. Normocephalic/atraumatic. PERRL. Sclera are nonicteric. Neck is supple. No masses. No JVD. Lungs are clear. Cardiac exam shows a regular rate and rhythm. Abdomen is soft. Extremities are without edema. Gait and ROM are intact. No gross neurologic deficits noted.  LABORATORY DATA: Lab Results  Component Value Date   WBC 5.9 05/17/2014   HGB 13.6 05/17/2014   HCT 41.1 05/17/2014   PLT 271 05/17/2014   GLUCOSE 90 05/17/2014   CHOL 108 02/11/2013   TRIG 41.0 02/11/2013   HDL 53.40 02/11/2013   LDLCALC 46 02/11/2013   ALT 17 02/11/2013   AST 20 02/11/2013   NA 141 05/17/2014   K 4.0 05/17/2014   CL 103 05/17/2014   CREATININE 0.89 05/17/2014   BUN 14 05/17/2014   CO2 26 05/17/2014   INR 1.08 02/28/2010   No results found for this basename: CKTOTAL,  CKMB,  CKMBINDEX,  TROPONINI   Dg Chest Portable 1 View  05/17/2014     IMPRESSION: No edema or consolidation.   Electronically Signed   By: Lowella Grip M.D.   On: 05/17/2014 15:05   Ct Angio Chest Aorta W/cm &/or Wo/cm  05/17/2014   .  IMPRESSION: No evidence of aortic aneurysm or dissection.  Borderline cardiomegaly. Coronary artery disease with left anterior descending coronary stent noted.  Bibasilar scarring.  Mild hyperinflation/COPD.  No acute findings in the chest, abdomen or pelvis.   Electronically Signed   By: Rolm Baptise M.D.   On: 05/17/2014 18:31   Ct Angio Abd/pel W/ And/or W/o  05/17/2014  IMPRESSION: No evidence of aortic aneurysm or dissection.  Borderline cardiomegaly. Coronary artery disease with left anterior descending coronary stent noted.  Bibasilar scarring.  Mild hyperinflation/COPD.  No acute findings in the chest, abdomen or pelvis.   Electronically Signed   By: Rolm Baptise M.D.   On: 05/17/2014 18:31   Pro B Natriuretic peptide (BNP) 0 - 125 pg/mL  501.1 (H)     Assessment / Plan: 1. HTN - uncontrolled - will add HCTZ 25 mg a day. Stop Lasix. Needs echo. Check BMET today. See back in 3 weeks. Explained the importance of good BP control to prevent future issues with heart failure. I suspect she has some degree of diastolic dysfunction.  2. CAD - no active chest pain  3. Asthma  4. HLD  Patient is agreeable to this plan and will call if any problems develop in the interim.   Burtis Junes, RN, [facility] [address] [place], [place]  [address] [phone]   5. Elevated BNP - check echo.

## 2014-05-26 ENCOUNTER — Other Ambulatory Visit: Payer: Self-pay | Admitting: *Deleted

## 2014-05-26 DIAGNOSIS — I2510 Atherosclerotic heart disease of native coronary artery without angina pectoris: Secondary | ICD-10-CM

## 2014-06-10 ENCOUNTER — Ambulatory Visit (HOSPITAL_COMMUNITY): Payer: Medicare Other | Attending: Nurse Practitioner | Admitting: Radiology

## 2014-06-10 DIAGNOSIS — I429 Cardiomyopathy, unspecified: Secondary | ICD-10-CM

## 2014-06-10 DIAGNOSIS — I2510 Atherosclerotic heart disease of native coronary artery without angina pectoris: Secondary | ICD-10-CM

## 2014-06-10 DIAGNOSIS — Z853 Personal history of malignant neoplasm of breast: Secondary | ICD-10-CM | POA: Insufficient documentation

## 2014-06-10 DIAGNOSIS — E785 Hyperlipidemia, unspecified: Secondary | ICD-10-CM | POA: Insufficient documentation

## 2014-06-10 DIAGNOSIS — Z87891 Personal history of nicotine dependence: Secondary | ICD-10-CM | POA: Insufficient documentation

## 2014-06-10 DIAGNOSIS — I079 Rheumatic tricuspid valve disease, unspecified: Secondary | ICD-10-CM | POA: Insufficient documentation

## 2014-06-10 DIAGNOSIS — I10 Essential (primary) hypertension: Secondary | ICD-10-CM | POA: Insufficient documentation

## 2014-06-10 NOTE — Progress Notes (Signed)
Echocardiogram performed.  

## 2014-06-18 ENCOUNTER — Encounter: Payer: Self-pay | Admitting: Nurse Practitioner

## 2014-06-18 ENCOUNTER — Ambulatory Visit (INDEPENDENT_AMBULATORY_CARE_PROVIDER_SITE_OTHER): Payer: Medicare Other | Admitting: Nurse Practitioner

## 2014-06-18 ENCOUNTER — Telehealth: Payer: Self-pay | Admitting: Cardiovascular Disease

## 2014-06-18 VITALS — BP 150/90 | HR 53 | Ht 60.0 in | Wt 113.8 lb

## 2014-06-18 DIAGNOSIS — E785 Hyperlipidemia, unspecified: Secondary | ICD-10-CM

## 2014-06-18 DIAGNOSIS — I2510 Atherosclerotic heart disease of native coronary artery without angina pectoris: Secondary | ICD-10-CM

## 2014-06-18 DIAGNOSIS — I10 Essential (primary) hypertension: Secondary | ICD-10-CM

## 2014-06-18 LAB — BASIC METABOLIC PANEL
BUN: 15 mg/dL (ref 6–23)
CO2: 32 mEq/L (ref 19–32)
Calcium: 8.8 mg/dL (ref 8.4–10.5)
Chloride: 99 mEq/L (ref 96–112)
Creatinine, Ser: 0.8 mg/dL (ref 0.4–1.2)
GFR: 77.67 mL/min (ref >60.00)
Glucose, Bld: 56 mg/dL — ABNORMAL LOW (ref 70–99)
Potassium: 3.2 mEq/L — ABNORMAL LOW (ref 3.5–5.1)
Sodium: 140 mEq/L (ref 135–145)

## 2014-06-18 NOTE — Telephone Encounter (Signed)
Follow Up    Pt is calling to follow up on call regarding her test results. Please call.

## 2014-06-18 NOTE — Patient Instructions (Addendum)
We need to recheck your lab today (BMET)  Stay on your current medicines -  You may alter your dose of HCTZ based on your BP's  Continue to monitor your blood pressure at home  See Dr. Acie Fredrickson in 6 months  Call the [place] office at [phone] if you have any questions, problems or concerns.

## 2014-06-18 NOTE — Progress Notes (Signed)
[name] [name] Date of Birth: Aug 22, 1946 Medical Record #[id number]  History of Present Illness: [name] [name] is seen back today for a 3 week check. Seen for Dr. Acie Fredrickson. She has known CAD with Taxus DES to the LAD in the setting of NSTEMI in August of 2004. Other issues include HTN, HLD, asthma, breast cancer and anxiety. Last cath in 2004. Last echo in 2005. Last Myoview from 2012 with no ischemia and normal EF at 80%.  Seen 3 weeks ago - had been to the ER because of feeling "awful". Negative evaluation but was given Lasix - Negative CT scan. BP quite high. BNP was 501.  I added HCTZ. Updated her echo.   Comes back today. Here alone. She has cut her dose of HCTZ as of last week - was feeling lightheaded -  especially if she stood up too quick. BP has trended down quite nicely at home. She feels better. Continues to walk regularly. Less stress. Son moving out. No chest pain. Not short of breath.    Current Outpatient Prescriptions  Medication Sig Dispense Refill  . acetaminophen (TYLENOL) 325 MG tablet Take 650 mg by mouth every 6 (six) hours as needed for mild pain.       Marland Kitchen albuterol (PROAIR HFA) 108 (90 BASE) MCG/ACT inhaler Inhale 2 puffs into the lungs every 6 (six) hours as needed for shortness of breath.       Marland Kitchen aspirin 81 MG tablet Take 81 mg by mouth daily.       . budesonide (PULMICORT FLEXHALER) 180 MCG/ACT inhaler Inhale 1 puff into the lungs 2 (two) times daily. Rinse mouth well after use.       . calcium carbonate (OS-CAL) 600 MG TABS Take 600 mg by mouth daily. 1Tablet Daily      . Cholecalciferol (VITAMIN D) 2000 UNITS tablet Take 4,000 Units by mouth daily.      . fluticasone (FLONASE) 50 MCG/ACT nasal spray Place 1 spray into both nostrils daily as needed for allergies.       . hydrochlorothiazide (HYDRODIURIL) 25 MG tablet Take 1 tablet (25 mg total) by mouth daily.  90 tablet  3  . Loratadine (CLARITIN) 10 MG CAPS Take 10 mg by mouth daily as needed. For seasonal allergies       . nebivolol (BYSTOLIC) 5 MG tablet Take 1 tablet (5 mg total) by mouth daily.  30 tablet  6  . predniSONE (DELTASONE) 20 MG tablet 2 tabs po daily x 3 days  6 tablet  0  . PREMARIN vaginal cream Place 0.5 Applicatorfuls vaginally 2 (two) times a week. On Tuesday and Saturday     Use as directed      . rosuvastatin (CRESTOR) 5 MG tablet Take 1 tablet (5 mg total) by mouth daily.  30 tablet  6  . Teriparatide, Recombinant, (FORTEO) 600 MCG/2.4ML SOLN Inject 20 mcg into the skin every evening.      . valsartan (DIOVAN) 160 MG tablet Take 1 tablet (160 mg total) by mouth daily.  30 tablet  5   No current facility-administered medications for this visit.    Allergies  Allergen Reactions  . Codeine Other (See Comments)    headache  . Lisinopril Cough  . Simvastatin Diarrhea    Past Medical History  Diagnosis Date  . Allergic rhinitis   . Asthma   . CAD (coronary artery disease)     a. NSTEMI 8/04 => LHC 07/2003:  mLAD hazy 75% => Taxus  DES, EF 30% with ant-apical, apical and inf-apical AK.  b.  Echo 4/05: EF 60%.  c.  ETT/Lexiscan Myoview 06/2011: EF 80%, no ischemia, normal wall motion  . MI (myocardial infarction) 2004    Anteroapical MI with PCI to the LAD  . Positive PPD   . Anxiety disorder   . Breast cancer     bilateral  . Hyperplastic colon polyp 10/16/99  . Hypertension   . Internal and external hemorrhoids without complication 57/01/77  . Diverticulosis 11/13/04  . PVC (premature ventricular contraction)     Past Surgical History  Procedure Laterality Date  . Bilateral mastectomy    . Appendectomy    . Coronary angioplasty with stent placement  2004    LAD  . US echocardiography  03/27/2004    resting ef 60%  . Cardiovascular stress test  07/01/2006    ef 80%    History  Smoking status  . Former Smoker  . Quit date: 08/07/1966  Smokeless tobacco  . Never Used    History  Alcohol Use  . 1.0 oz/week  . 2 drink(s) per week    Family History  Problem  Relation Age of Onset  . Breast cancer Mother   . Breast cancer Sister   . Heart disease Maternal Grandmother   . Colon cancer Neg Hx   . Asthma Daughter   . Spina bifida      Review of Systems: The review of systems is per the HPI.  All other systems were reviewed and are negative.  Physical Exam: BP 150/90  Pulse 53  Ht 5' (1.524 m)  Wt 113 lb 12.8 oz (51.619 kg)  BMI 22.22 kg/m2  SpO2 97% Patient is very pleasant and in no acute distress. Skin is warm and dry. Color is normal.  HEENT is unremarkable. Normocephalic/atraumatic. PERRL. Sclera are nonicteric. Neck is supple. No masses. No JVD. Lungs are clear. Cardiac exam shows a regular rate and rhythm. Abdomen is soft. Extremities are without edema. Gait and ROM are intact. No gross neurologic deficits noted.  Wt Readings from Last 3 Encounters:  06/18/14 113 lb 12.8 oz (51.619 kg)  05/24/14 114 lb (51.71 kg)  05/17/14 111 lb (50.349 kg)    LABORATORY DATA/PROCEDURES: BMET pending  Lab Results  Component Value Date   WBC 5.9 05/17/2014   HGB 13.6 05/17/2014   HCT 41.1 05/17/2014   PLT 271 05/17/2014   GLUCOSE 74 05/24/2014   CHOL 108 02/11/2013   TRIG 41.0 02/11/2013   HDL 53.40 02/11/2013   LDLCALC 46 02/11/2013   ALT 17 02/11/2013   AST 20 02/11/2013   NA 139 05/24/2014   K 3.9 05/24/2014   CL 99 05/24/2014   CREATININE 0.9 05/24/2014   BUN 18 05/24/2014   CO2 36* 05/24/2014   INR 1.08 02/28/2010    BNP (last 3 results)  Recent Labs  05/17/14 1350  PROBNP 501.1*   Echo Study Conclusions from June 2015 - Left ventricle: The cavity size was normal. Systolic function was normal. The estimated ejection fraction was in the range of 60% to 65%. Wall motion was normal; there were no regional wall motion abnormalities. Left ventricular diastolic function parameters were normal. - Aortic valve: There was no regurgitation. - Aortic root: The aortic root was normal in size. - Mitral valve: Structurally normal valve. There was  trivial regurgitation. - Left atrium: The atrium was normal in size. - Right ventricle: Systolic function was normal. - Right atrium: The atrium was  normal in size. - Tricuspid valve: There was mild regurgitation. - Pulmonic valve: There was no regurgitation. - Pulmonary arteries: Systolic pressure was within the normal range. - Inferior vena cava: The vessel was normal in size. - Pericardium, extracardiac: There was no pericardial effusion.  Impressions:  - Normal biventricular size and function. Mild tricuspid regurgitation.    Assessment / Plan:  1. HTN - now on HCTZ 25 mg a day. BP has trended down quite nicely. She is altering her dose of HCTZ which I have said was ok for her to do. She will continue to monitor her blood pressure at home.   2. CAD - no active chest pain   3. Asthma   4. HLD   Recheck BMET today. See in 6 months.   Patient is agreeable to this plan and will call if any problems develop in the interim.   Burtis Junes, RN, [facility] [address] [place], [place]  [address] [phone]

## 2014-06-18 NOTE — Telephone Encounter (Signed)
Not my pt

## 2014-06-21 ENCOUNTER — Other Ambulatory Visit: Payer: Self-pay | Admitting: *Deleted

## 2014-06-21 DIAGNOSIS — E876 Hypokalemia: Secondary | ICD-10-CM

## 2014-06-21 MED ORDER — POTASSIUM CHLORIDE ER 10 MEQ PO TBCR: 10.0000 meq | EXTENDED_RELEASE_TABLET | Freq: Every day | ORAL | Status: DC

## 2014-07-05 ENCOUNTER — Other Ambulatory Visit (INDEPENDENT_AMBULATORY_CARE_PROVIDER_SITE_OTHER): Payer: Medicare Other

## 2014-07-05 ENCOUNTER — Other Ambulatory Visit: Payer: Self-pay | Admitting: *Deleted

## 2014-07-05 DIAGNOSIS — E876 Hypokalemia: Secondary | ICD-10-CM

## 2014-07-05 LAB — BASIC METABOLIC PANEL
BUN: 13 mg/dL (ref 6–23)
CO2: 32 mEq/L (ref 19–32)
Calcium: 8.4 mg/dL (ref 8.4–10.5)
Chloride: 97 mEq/L (ref 96–112)
Creatinine, Ser: 0.8 mg/dL (ref 0.4–1.2)
GFR: 71.3 mL/min (ref >60.00)
Glucose, Bld: 87 mg/dL (ref 70–99)
Potassium: 3 mEq/L — ABNORMAL LOW (ref 3.5–5.1)
Sodium: 135 mEq/L (ref 135–145)

## 2014-07-05 MED ORDER — POTASSIUM CHLORIDE ER 20 MEQ PO TBCR: 10.0000 meq | EXTENDED_RELEASE_TABLET | Freq: Two times a day (BID) | ORAL | Status: DC

## 2014-07-07 ENCOUNTER — Other Ambulatory Visit: Payer: Self-pay

## 2014-07-07 MED ORDER — POTASSIUM CHLORIDE CRYS ER 20 MEQ PO TBCR: 20.0000 meq | EXTENDED_RELEASE_TABLET | Freq: Two times a day (BID) | ORAL | Status: DC

## 2014-07-12 ENCOUNTER — Other Ambulatory Visit (INDEPENDENT_AMBULATORY_CARE_PROVIDER_SITE_OTHER): Payer: Medicare Other

## 2014-07-12 DIAGNOSIS — E876 Hypokalemia: Secondary | ICD-10-CM

## 2014-07-12 LAB — BASIC METABOLIC PANEL
BUN: 16 mg/dL (ref 6–23)
CO2: 30 mEq/L (ref 19–32)
Calcium: 8.6 mg/dL (ref 8.4–10.5)
Chloride: 101 mEq/L (ref 96–112)
Creatinine, Ser: 0.8 mg/dL (ref 0.4–1.2)
GFR: 80.02 mL/min (ref >60.00)
Glucose, Bld: 102 mg/dL — ABNORMAL HIGH (ref 70–99)
Potassium: 3.5 mEq/L (ref 3.5–5.1)
Sodium: 138 mEq/L (ref 135–145)

## 2014-07-13 ENCOUNTER — Other Ambulatory Visit: Payer: Self-pay | Admitting: *Deleted

## 2014-07-13 DIAGNOSIS — E876 Hypokalemia: Secondary | ICD-10-CM

## 2014-09-17 ENCOUNTER — Other Ambulatory Visit: Payer: Self-pay

## 2014-10-05 ENCOUNTER — Other Ambulatory Visit (INDEPENDENT_AMBULATORY_CARE_PROVIDER_SITE_OTHER): Payer: Medicare Other

## 2014-10-05 DIAGNOSIS — E876 Hypokalemia: Secondary | ICD-10-CM

## 2014-10-05 LAB — BASIC METABOLIC PANEL
BUN: 18 mg/dL (ref 6–23)
CO2: 33 mEq/L — ABNORMAL HIGH (ref 19–32)
Calcium: 8.6 mg/dL (ref 8.4–10.5)
Chloride: 101 mEq/L (ref 96–112)
Creatinine, Ser: 0.8 mg/dL (ref 0.4–1.2)
GFR: 71.24 mL/min (ref >60.00)
Glucose, Bld: 79 mg/dL (ref 70–99)
Potassium: 3.5 mEq/L (ref 3.5–5.1)
Sodium: 139 mEq/L (ref 135–145)

## 2014-10-07 ENCOUNTER — Other Ambulatory Visit: Payer: Self-pay

## 2014-10-07 MED ORDER — POTASSIUM CHLORIDE CRYS ER 20 MEQ PO TBCR: 20.0000 meq | EXTENDED_RELEASE_TABLET | Freq: Two times a day (BID) | ORAL | Status: DC

## 2015-01-05 ENCOUNTER — Other Ambulatory Visit: Payer: Self-pay

## 2015-01-05 MED ORDER — POTASSIUM CHLORIDE CRYS ER 20 MEQ PO TBCR: 20.0000 meq | EXTENDED_RELEASE_TABLET | Freq: Two times a day (BID) | ORAL | Status: DC

## 2015-01-13 ENCOUNTER — Encounter: Payer: Self-pay | Admitting: Cardiovascular Disease

## 2015-01-13 ENCOUNTER — Other Ambulatory Visit: Payer: Self-pay | Admitting: Nurse Practitioner

## 2015-01-13 ENCOUNTER — Ambulatory Visit (INDEPENDENT_AMBULATORY_CARE_PROVIDER_SITE_OTHER): Payer: Medicare Other | Admitting: Cardiovascular Disease

## 2015-01-13 VITALS — BP 144/87 | HR 57 | Ht 60.0 in | Wt 116.8 lb

## 2015-01-13 DIAGNOSIS — E785 Hyperlipidemia, unspecified: Secondary | ICD-10-CM

## 2015-01-13 DIAGNOSIS — E876 Hypokalemia: Secondary | ICD-10-CM

## 2015-01-13 DIAGNOSIS — I2510 Atherosclerotic heart disease of native coronary artery without angina pectoris: Secondary | ICD-10-CM

## 2015-01-13 MED ORDER — POTASSIUM CHLORIDE ER 20 MEQ PO TBCR: 20.0000 meq | EXTENDED_RELEASE_TABLET | Freq: Two times a day (BID) | ORAL | Status: DC

## 2015-01-13 NOTE — Patient Instructions (Signed)
Your physician recommends that you continue on your current medications as directed. Please refer to the Current Medication list given to you today.  Your physician wants you to follow-up in: 1 year with Dr. Nahser.  You will receive a reminder letter in the mail two months in advance. If you don't receive a letter, please call our office to schedule the follow-up appointment.  

## 2015-01-13 NOTE — Progress Notes (Signed)
Cardiology Office Note   Date:  01/13/2015   ID:  [name] [name], DOB 1946/01/02, MRN [id number]  PCP:  Cari Caraway, MD  Cardiologist:   Thayer Headings, MD   Chief Complaint  Patient presents with  . Follow-up    CAD   Problem list: 1. Coronary artery disease-status post PTCA and stenting of her left anterior descending artery (August, 2004), 3.0 x 20 mm Taxus stent inflated up to 12 atmospheres. 2. Hyperlipidemia 3. Breast Cancer 4. Asthma    Feb. 11, 2016  [name] [name] is a 69 y.o. female who presents for follow up of her CAD. She has seen Cecille Rubin for the past several visits.  She has been seen in the ER for dyspnea.   Echo in July, 2015 showed normal LV function with trivial valvular abnormalities. She was started on HCTZ - has had some orthostasis   She has been feeling much better recently . Thinks her potassium was too low. No chest pain . Has some palpitations.      Past Medical History  Diagnosis Date  . Allergic rhinitis   . Asthma   . CAD (coronary artery disease)     a. NSTEMI 8/04 => LHC 07/2003:  mLAD hazy 75% => Taxus DES, EF 30% with ant-apical, apical and inf-apical AK.  b.  Echo 4/05: EF 60%.  c.  ETT/Lexiscan Myoview 06/2011: EF 80%, no ischemia, normal wall motion  . MI (myocardial infarction) 2004    Anteroapical MI with PCI to the LAD  . Positive PPD   . Anxiety disorder   . Breast cancer     bilateral  . Hyperplastic colon polyp 10/16/99  . Hypertension   . Internal and external hemorrhoids without complication 98/92/11  . Diverticulosis 11/13/04  . PVC (premature ventricular contraction)     Past Surgical History  Procedure Laterality Date  . Bilateral mastectomy    . Appendectomy    . Coronary angioplasty with stent placement  2004    LAD  . US echocardiography  03/27/2004    resting ef 60%  . Cardiovascular stress test  07/01/2006    ef 80%     Current Outpatient Prescriptions  Medication Sig Dispense Refill  . acetaminophen  (TYLENOL) 325 MG tablet Take 650 mg by mouth every 6 (six) hours as needed for mild pain.     Marland Kitchen albuterol (PROAIR HFA) 108 (90 BASE) MCG/ACT inhaler Inhale 2 puffs into the lungs every 6 (six) hours as needed for shortness of breath.     Marland Kitchen aspirin 81 MG tablet Take 81 mg by mouth daily.     . budesonide (PULMICORT FLEXHALER) 180 MCG/ACT inhaler Inhale 1 puff into the lungs 2 (two) times daily. Rinse mouth well after use.     . calcium carbonate (OS-CAL) 600 MG TABS Take 600 mg by mouth daily. 1Tablet Daily    . Cholecalciferol (VITAMIN D) 2000 UNITS tablet Take 4,000 Units by mouth daily.    . fluticasone (FLONASE) 50 MCG/ACT nasal spray Place 1 spray into both nostrils daily as needed for allergies.     . hydrochlorothiazide (HYDRODIURIL) 25 MG tablet Take 1 tablet (25 mg total) by mouth daily. 90 tablet 3  . Loratadine (CLARITIN) 10 MG CAPS Take 10 mg by mouth daily as needed. For seasonal allergies    . nebivolol (BYSTOLIC) 5 MG tablet Take 1 tablet (5 mg total) by mouth daily. 30 tablet 6  . potassium chloride 20 MEQ TBCR Take 10 mEq by mouth  2 (two) times daily. 30 tablet 3  . potassium chloride SA (KLOR-CON M20) 20 MEQ tablet Take 1 tablet (20 mEq total) by mouth 2 (two) times daily. 60 tablet 2  . predniSONE (DELTASONE) 20 MG tablet 2 tabs po daily x 3 days 6 tablet 0  . PREMARIN vaginal cream Place 0.5 Applicatorfuls vaginally 2 (two) times a week. On Tuesday and Saturday     Use as directed    . rosuvastatin (CRESTOR) 5 MG tablet Take 1 tablet (5 mg total) by mouth daily. 30 tablet 6  . Teriparatide, Recombinant, (FORTEO) 600 MCG/2.4ML SOLN Inject 20 mcg into the skin every evening.    . valsartan (DIOVAN) 160 MG tablet Take 1 tablet (160 mg total) by mouth daily. 30 tablet 5   No current facility-administered medications for this visit.    Allergies:   Codeine; Lisinopril; and Simvastatin    Social History:  The patient  reports that she quit smoking about 48 years ago. She has  never used smokeless tobacco. She reports that she drinks about 1.0 oz of alcohol per week. She reports that she does not use illicit drugs.   Family History:  The patient's family history includes Asthma in her daughter; Breast cancer in her mother and sister; Heart disease in her maternal grandmother; Spina bifida in an other family member. There is no history of Colon cancer.    ROS:  Please see the history of present illness.    Review of Systems: Constitutional:  denies fever, chills, diaphoresis, appetite change and fatigue.  HEENT: denies photophobia, eye pain, redness, hearing loss, ear pain, congestion, sore throat, rhinorrhea, sneezing, neck pain, neck stiffness and tinnitus.  Respiratory: denies SOB, DOE, cough, chest tightness, and wheezing.  Cardiovascular: denies chest pain, palpitations and leg swelling.  Gastrointestinal: denies nausea, vomiting, abdominal pain, diarrhea, constipation, blood in stool.  Genitourinary: denies dysuria, urgency, frequency, hematuria, flank pain and difficulty urinating.  Musculoskeletal: denies  myalgias, back pain, joint swelling, arthralgias and gait problem.   Skin: denies pallor, rash and wound.  Neurological: denies dizziness, seizures, syncope, weakness, light-headedness, numbness and headaches.   Hematological: denies adenopathy, easy bruising, personal or family bleeding history.  Psychiatric/ Behavioral: denies suicidal ideation, mood changes, confusion, nervousness, sleep disturbance and agitation.       All other systems are reviewed and negative.    PHYSICAL EXAM: VS:  There were no vitals taken for this visit. , BMI There is no weight on file to calculate BMI. GEN: Well nourished, well developed, in no acute distress HEENT: normal Neck: no JVD, carotid bruits, or masses Cardiac: RRR; no murmurs, rubs, or gallops,no edema  Respiratory:  clear to auscultation bilaterally, normal work of breathing GI: soft, nontender,  nondistended, + BS MS: no deformity or atrophy Skin: warm and dry, no rash Neuro:  Strength and sensation are intact Psych: normal   EKG:  EKG is not ordered today.    Recent Labs: 05/17/2014: Hemoglobin 13.6; Platelets 271; Pro B Natriuretic peptide (BNP) 501.1* 10/05/2014: BUN 18; Creatinine 0.8; Potassium 3.5; Sodium 139    Lipid Panel    Component Value Date/Time   CHOL 108 02/11/2013 0850   TRIG 41.0 02/11/2013 0850   HDL 53.40 02/11/2013 0850   CHOLHDL 2 02/11/2013 0850   VLDL 8.2 02/11/2013 0850   LDLCALC 46 02/11/2013 0850      Wt Readings from Last 3 Encounters:  06/18/14 113 lb 12.8 oz (51.619 kg)  05/24/14 114 lb (51.71 kg)  05/17/14  111 lb (50.349 kg)      Other studies Reviewed: Additional studies/ records that were reviewed today include: labs from Dr. Addison Lank. Review of the above records demonstrates: normal lipids   ASSESSMENT AND PLAN:  1. Coronary artery disease-status post PTCA and stenting of her left anterior descending artery (August, 2004), 3.0 x 20 mm Taxus stent inflated up to 12 atmospheres. - She is doing quite well.  she's not had any episodes of angina.  2. Hyperlipidemia -  her lipids were checked by her medical doctor. Her total crystals 123. The HDL is 55. Her triglyceride levels 54. The LDL cholesterols 57. Continue current dose of Crestor 5 mg a day  3. Breast Cancer- stable  4. Asthma - she had an episode of asthma earlier this year. She's now better.   Current medicines are reviewed at length with the patient today.  The patient does not have concerns regarding medicines.  The following changes have been made:  no change   Disposition:   FU with me in 1 year     Signed, Nahser, Wonda Cheng, MD  01/13/2015 5:52 AM    [facility] [place], [place], [place]  [address] Phone: [phone]; Fax: [phone]

## 2015-04-04 ENCOUNTER — Other Ambulatory Visit: Payer: Self-pay | Admitting: *Deleted

## 2015-04-04 MED ORDER — POTASSIUM CHLORIDE CRYS ER 20 MEQ PO TBCR: 20.0000 meq | EXTENDED_RELEASE_TABLET | Freq: Two times a day (BID) | ORAL | Status: DC

## 2015-05-30 ENCOUNTER — Other Ambulatory Visit: Payer: Self-pay

## 2015-12-04 HISTORY — PX: COLONOSCOPY: SHX174

## 2016-01-04 ENCOUNTER — Encounter: Payer: Self-pay | Admitting: Cardiovascular Disease

## 2016-01-13 ENCOUNTER — Ambulatory Visit (INDEPENDENT_AMBULATORY_CARE_PROVIDER_SITE_OTHER): Payer: Medicare Other | Admitting: Cardiovascular Disease

## 2016-01-13 ENCOUNTER — Encounter: Payer: Self-pay | Admitting: Cardiovascular Disease

## 2016-01-13 VITALS — BP 160/110 | HR 49 | Ht 60.0 in | Wt 117.0 lb

## 2016-01-13 DIAGNOSIS — I10 Essential (primary) hypertension: Secondary | ICD-10-CM

## 2016-01-13 DIAGNOSIS — I2510 Atherosclerotic heart disease of native coronary artery without angina pectoris: Secondary | ICD-10-CM | POA: Diagnosis not present

## 2016-01-13 DIAGNOSIS — E785 Hyperlipidemia, unspecified: Secondary | ICD-10-CM | POA: Diagnosis not present

## 2016-01-13 NOTE — Progress Notes (Signed)
Cardiology Office Note   Date:  01/13/2016   ID:  [name] [name], DOB 07-17-1946, MRN [id number]  PCP:  Cari Caraway, MD  Cardiologist:   Thayer Headings, MD   Chief Complaint  Patient presents with  . Coronary Artery Disease   Problem list: 1. Coronary artery disease-status post PTCA and stenting of her left anterior descending artery (August, 2004), 3.0 x 20 mm Taxus stent inflated up to 12 atmospheres. 2. Hyperlipidemia 3. Breast Cancer 4. Asthma    Feb. 11, 2016  [name] [name] is a 70 y.o. female who presents for follow up of her CAD. She has seen Cecille Rubin for the past several visits.  She has been seen in the ER for dyspnea.   Echo in July, 2015 showed normal LV function with trivial valvular abnormalities. She was started on HCTZ - has had some orthostasis   She has been feeling much better recently . Thinks her potassium was too low. No chest pain . Has some palpitations.    Feb. 10, 2017:  [name] [name] is doing great  Doing great.   BP has been ok. Is elevated here.   Exercises in the summer. Not as much in the winter   No CP or dyspnea.   Past Medical History  Diagnosis Date  . Allergic rhinitis   . Asthma   . CAD (coronary artery disease)     a. NSTEMI 8/04 => LHC 07/2003:  mLAD hazy 75% => Taxus DES, EF 30% with ant-apical, apical and inf-apical AK.  b.  Echo 4/05: EF 60%.  c.  ETT/Lexiscan Myoview 06/2011: EF 80%, no ischemia, normal wall motion  . MI (myocardial infarction) ([place]) 2004    Anteroapical MI with PCI to the LAD  . Positive PPD   . Anxiety disorder   . Breast cancer ([place])     bilateral  . Hyperplastic colon polyp 10/16/99  . Hypertension   . Internal and external hemorrhoids without complication [id number]  . Diverticulosis 11/13/04  . PVC (premature ventricular contraction)     Past Surgical History  Procedure Laterality Date  . Bilateral mastectomy    . Appendectomy    . Coronary angioplasty with stent placement  2004    LAD  . US  echocardiography  03/27/2004    resting ef 60%  . Cardiovascular stress test  07/01/2006    ef 80%     Current Outpatient Prescriptions  Medication Sig Dispense Refill  . acetaminophen (TYLENOL) 325 MG tablet Take 650 mg by mouth every 6 (six) hours as needed for mild pain.     Marland Kitchen albuterol (PROAIR HFA) 108 (90 BASE) MCG/ACT inhaler Inhale 2 puffs into the lungs every 6 (six) hours as needed for shortness of breath.     Marland Kitchen aspirin 81 MG tablet Take 81 mg by mouth daily.     . calcium carbonate (OS-CAL) 600 MG TABS Take 600 mg by mouth daily. 1Tablet Daily    . Cholecalciferol (VITAMIN D) 2000 UNITS tablet Take 4,000 Units by mouth daily.    Marland Kitchen escitalopram (LEXAPRO) 5 MG tablet Take 5 mg by mouth daily.  0  . FLOVENT HFA 110 MCG/ACT inhaler Inhale 1 puff into the lungs 2 (two) times daily.   1  . fluticasone (FLONASE) 50 MCG/ACT nasal spray Place 1 spray into both nostrils daily as needed for allergies.     . hydrochlorothiazide (HYDRODIURIL) 25 MG tablet Take 1 tablet (25 mg total) by mouth daily. 90 tablet 3  . Loratadine (CLARITIN)  10 MG CAPS Take 10 mg by mouth daily as needed. For seasonal allergies    . nebivolol (BYSTOLIC) 5 MG tablet Take 1 tablet (5 mg total) by mouth daily. 30 tablet 6  . Potassium Chloride ER 20 MEQ TBCR Take 20 mEq by mouth 2 (two) times daily. 62 tablet 11  . PREMARIN vaginal cream Place 0.5 Applicatorfuls vaginally 2 (two) times a week. On Tuesday and Saturday     Use as directed    . rosuvastatin (CRESTOR) 5 MG tablet Take 1 tablet (5 mg total) by mouth daily. 30 tablet 6  . Teriparatide, Recombinant, (FORTEO) 600 MCG/2.4ML SOLN Inject 20 mcg into the skin every evening.    . valsartan (DIOVAN) 160 MG tablet Take 1 tablet (160 mg total) by mouth daily. 30 tablet 5   No current facility-administered medications for this visit.    Allergies:   Codeine; Lisinopril; and Simvastatin    Social History:  The patient  reports that she quit smoking about 49 years  ago. She has never used smokeless tobacco. She reports that she drinks about 1.0 oz of alcohol per week. She reports that she does not use illicit drugs.   Family History:  The patient's family history includes Asthma in her daughter; Breast cancer in her mother and sister; Heart disease in her maternal grandmother. There is no history of Colon cancer.    ROS:  Please see the history of present illness.    Review of Systems: Constitutional:  denies fever, chills, diaphoresis, appetite change and fatigue.  HEENT: denies photophobia, eye pain, redness, hearing loss, ear pain, congestion, sore throat, rhinorrhea, sneezing, neck pain, neck stiffness and tinnitus.  Respiratory: denies SOB, DOE, cough, chest tightness, and wheezing.  Cardiovascular: denies chest pain, palpitations and leg swelling.  Gastrointestinal: denies nausea, vomiting, abdominal pain, diarrhea, constipation, blood in stool.  Genitourinary: denies dysuria, urgency, frequency, hematuria, flank pain and difficulty urinating.  Musculoskeletal: denies  myalgias, back pain, joint swelling, arthralgias and gait problem.   Skin: denies pallor, rash and wound.  Neurological: denies dizziness, seizures, syncope, weakness, light-headedness, numbness and headaches.   Hematological: denies adenopathy, easy bruising, personal or family bleeding history.  Psychiatric/ Behavioral: denies suicidal ideation, mood changes, confusion, nervousness, sleep disturbance and agitation.       All other systems are reviewed and negative.    PHYSICAL EXAM: VS:  BP 160/110 mmHg  Pulse 49  Ht 5' (1.524 m)  Wt 117 lb (53.071 kg)  BMI 22.85 kg/m2 , BMI Body mass index is 22.85 kg/(m^2). GEN: Well nourished, well developed, in no acute distress HEENT: normal Neck: no JVD, carotid bruits, or masses Cardiac: RRR; no murmurs, rubs, or gallops,no edema  Respiratory:  clear to auscultation bilaterally, normal work of breathing GI: soft, nontender,  nondistended, + BS MS: no deformity or atrophy Skin: warm and dry, no rash Neuro:  Strength and sensation are intact Psych: normal   EKG:  EKG is not ordered today.    Recent Labs: No results found for requested labs within last 365 days.    Lipid Panel    Component Value Date/Time   CHOL 108 02/11/2013 0850   TRIG 41.0 02/11/2013 0850   HDL 53.40 02/11/2013 0850   CHOLHDL 2 02/11/2013 0850   VLDL 8.2 02/11/2013 0850   LDLCALC 46 02/11/2013 0850      Wt Readings from Last 3 Encounters:  01/13/16 117 lb (53.071 kg)  01/13/15 116 lb 12.8 oz (52.98 kg)  06/18/14 113  lb 12.8 oz (51.619 kg)    ECG: 01/13/2016: Sinus bradycardia at 49. Otherwise the EKG is normal.  Other studies Reviewed: Additional studies/ records that were reviewed today include: labs from Dr. Addison Lank. Review of the above records demonstrates: normal lipids   ASSESSMENT AND PLAN:  1. Coronary artery disease-status post PTCA and stenting of her left anterior descending artery (August, 2004), 3.0 x 20 mm Taxus stent inflated up to 12 atmospheres. - She is doing quite well.  she's not had any episodes of angina.  2. Hyperlipidemia -  her lipids were checked by her medical doctor. Her total crystals 123. The HDL is 55. Her triglyceride levels 54. The LDL cholesterols 57. Continue current dose of Crestor 5 mg a day  3. Breast Cancer- stable  4. Asthma - she had an episode of asthma earlier this year. She's now better.  5. Hypertension: Her blood pressure is elevated today. She has not been taking her HCTZ as prescribed. She also has been eating more salt. She'll tighten up on her diet will take the HCTZ regularly. She will be seeing her primary medical doctor soon. We may need to go up on the valsartan. I would like to see her in 6 months for follow-up visit. I'll see her sooner if her blood pressure readings do not improve.  Current medicines are reviewed at length with the patient today.  The patient  does not have concerns regarding medicines.  The following changes have been made:  no change   Disposition:   FU with me in 6 months   Signed, Gerrie Castiglia, Wonda Cheng, MD  01/13/2016 12:09 PM    [address], [place], [place]  [id number] Phone: [phone]; Fax: [phone]

## 2016-01-13 NOTE — Patient Instructions (Signed)

## 2016-02-13 ENCOUNTER — Other Ambulatory Visit: Payer: Self-pay | Admitting: *Deleted

## 2016-02-13 DIAGNOSIS — I2510 Atherosclerotic heart disease of native coronary artery without angina pectoris: Secondary | ICD-10-CM

## 2016-02-13 DIAGNOSIS — I10 Essential (primary) hypertension: Secondary | ICD-10-CM

## 2016-02-13 MED ORDER — HYDROCHLOROTHIAZIDE 25 MG PO TABS: 25.0000 mg | ORAL_TABLET | Freq: Every day | ORAL | Status: DC

## 2016-02-24 ENCOUNTER — Encounter: Payer: Self-pay | Admitting: Gastroenterology

## 2016-02-27 ENCOUNTER — Encounter: Payer: Self-pay | Admitting: Gastroenterology

## 2016-04-18 ENCOUNTER — Ambulatory Visit (AMBULATORY_SURGERY_CENTER): Payer: Self-pay

## 2016-04-18 ENCOUNTER — Encounter: Payer: Self-pay | Admitting: Gastroenterology

## 2016-04-18 VITALS — Ht 60.0 in | Wt 118.4 lb

## 2016-04-18 DIAGNOSIS — Z860100 Personal history of colon polyps, unspecified: Secondary | ICD-10-CM

## 2016-04-18 MED ORDER — SUPREP BOWEL PREP KIT 17.5-3.13-1.6 GM/177ML PO SOLN: 1.0000 | Freq: Once | ORAL | Status: DC

## 2016-04-18 NOTE — Progress Notes (Signed)
No allergies to eggs or soy No past problems with anesthesia No home oxygen No diet meds  Has email and internet; declined emmi 

## 2016-05-02 ENCOUNTER — Ambulatory Visit (AMBULATORY_SURGERY_CENTER): Payer: Medicare Other | Admitting: Gastroenterology

## 2016-05-02 ENCOUNTER — Encounter: Payer: Self-pay | Admitting: Gastroenterology

## 2016-05-02 VITALS — BP 114/7 | HR 46 | Temp 97.3°F | Resp 13 | Ht 60.0 in | Wt 118.0 lb

## 2016-05-02 DIAGNOSIS — Z86010 Personal history of colonic polyps: Secondary | ICD-10-CM

## 2016-05-02 DIAGNOSIS — D122 Benign neoplasm of ascending colon: Secondary | ICD-10-CM

## 2016-05-02 MED ORDER — SODIUM CHLORIDE 0.9 % IV SOLN: 500.0000 mL | INTRAVENOUS | Status: DC

## 2016-05-02 NOTE — Progress Notes (Signed)
Report to PACU, RN, vss, BBS= Clear.  

## 2016-05-02 NOTE — Op Note (Signed)
[facility] Patient Name: [name] [name] Procedure Date: 05/02/2016 11:11 AM MRN: [id number] Endoscopist: Mauri Pole , MD Age: 70 Referring MD:  Date of Birth: Jul 31, 1946 Gender: Female Procedure:                Colonoscopy Indications:              Surveillance: Personal history of adenomatous                            polyps on last colonoscopy 5 years ago Medicines:                Monitored Anesthesia Care Procedure:                Pre-Anesthesia Assessment:                           - Prior to the procedure, a History and Physical                            was performed, and patient medications and                            allergies were reviewed. The patient's tolerance of                            previous anesthesia was also reviewed. The risks                            and benefits of the procedure and the sedation                            options and risks were discussed with the patient.                            All questions were answered, and informed consent                            was obtained. Prior Anticoagulants: The patient has                            taken no previous anticoagulant or antiplatelet                            agents. ASA Grade Assessment: III - A patient with                            severe systemic disease. After reviewing the risks                            and benefits, the patient was deemed in                            satisfactory condition to undergo the procedure.  After obtaining informed consent, the colonoscope                            was passed under direct vision. Throughout the                            procedure, the patient's blood pressure, pulse, and                            oxygen saturations were monitored continuously. The                            Model PCF-H190DL [id number]) scope was introduced                            through the anus and advanced to the  the cecum,                            identified by appendiceal orifice and ileocecal                            valve. The colonoscopy was performed without                            difficulty. The patient tolerated the procedure                            well. The quality of the bowel preparation was                            adequate after extensive lavage and suction. The                            ileocecal valve, appendiceal orifice, and rectum                            were photographed. Scope In: 11:19:55 AM Scope Out: 11:43:48 AM Scope Withdrawal Time: 0 hours 11 minutes 42 seconds  Total Procedure Duration: 0 hours 23 minutes 53 seconds  Findings:                 The perianal and digital rectal examinations were                            normal.                           A 3 mm polyp was found in the ascending colon. The                            polyp was sessile. The polyp was removed with a                            cold biopsy forceps. Resection and retrieval were  complete.                           Multiple small and large-mouthed diverticula were                            found in the entire colon.                           Non-bleeding internal hemorrhoids were found during                            retroflexion. The hemorrhoids were small. Complications:            No immediate complications. Estimated Blood Loss:     Estimated blood loss: none. Impression:               - One 3 mm polyp in the ascending colon, removed                            with a cold biopsy forceps. Resected and retrieved.                           - Severe Diverticulosis in the entire examined                            colon.                           - Non-bleeding internal hemorrhoids. Recommendation:           - Patient has a contact number available for                            emergencies. The signs and symptoms of potential                             delayed complications were discussed with the                            patient. Return to normal activities tomorrow.                            Written discharge instructions were provided to the                            patient.                           - Resume previous diet.                           - Continue present medications.                           - Await pathology results.                           -  Repeat colonoscopy in 5-10 years for surveillance. Mauri Pole, MD 05/02/2016 11:48:44 AM This report has been signed electronically.

## 2016-05-02 NOTE — Progress Notes (Signed)
Called to room to assist during endoscopic procedure.  Patient ID and intended procedure confirmed with present staff. Received instructions for my participation in the procedure from the performing physician.  

## 2016-05-02 NOTE — Patient Instructions (Signed)
YOU HAD AN ENDOSCOPIC PROCEDURE TODAY AT THE [facility]:   Refer to the procedure report that was given to you for any specific questions about what was found during the examination.  If the procedure report does not answer your questions, please call your gastroenterologist to clarify.  If you requested that your care partner not be given the details of your procedure findings, then the procedure report has been included in a sealed envelope for you to review at your convenience later.  YOU SHOULD EXPECT: Some feelings of bloating in the abdomen. Passage of more gas than usual.  Walking can help get rid of the air that was put into your GI tract during the procedure and reduce the bloating. If you had a lower endoscopy (such as a colonoscopy or flexible sigmoidoscopy) you may notice spotting of blood in your stool or on the toilet paper. If you underwent a bowel prep for your procedure, you may not have a normal bowel movement for a few days.  Please Note:  You might notice some irritation and congestion in your nose or some drainage.  This is from the oxygen used during your procedure.  There is no need for concern and it should clear up in a day or so.  SYMPTOMS TO REPORT IMMEDIATELY:   Following lower endoscopy (colonoscopy or flexible sigmoidoscopy):  Excessive amounts of blood in the stool  Significant tenderness or worsening of abdominal pains  Swelling of the abdomen that is new, acute  Fever of 100F or higher    For urgent or emergent issues, a gastroenterologist can be reached at any hour by calling [phone].   DIET: Your first meal following the procedure should be a small meal and then it is ok to progress to your normal diet. Heavy or fried foods are harder to digest and may make you feel nauseous or bloated.  Likewise, meals heavy in dairy and vegetables can increase bloating.  Drink plenty of fluids but you should avoid alcoholic beverages for 24  hours.  ACTIVITY:  You should plan to take it easy for the rest of today and you should NOT DRIVE or use heavy machinery until tomorrow (because of the sedation medicines used during the test).    FOLLOW UP: Our staff will call the number listed on your records the next business day following your procedure to check on you and address any questions or concerns that you may have regarding the information given to you following your procedure. If we do not reach you, we will leave a message.  However, if you are feeling well and you are not experiencing any problems, there is no need to return our call.  We will assume that you have returned to your regular daily activities without incident.  If any biopsies were taken you will be contacted by phone or by letter within the next 1-3 weeks.  Please call us at [phone] if you have not heard about the biopsies in 3 weeks.    SIGNATURES/CONFIDENTIALITY: You and/or your care partner have signed paperwork which will be entered into your electronic medical record.  These signatures attest to the fact that that the information above on your After Visit Summary has been reviewed and is understood.  Full responsibility of the confidentiality of this discharge information lies with you and/or your care-partner.  Polyp, diverticulosis, high fiber diet information given. 

## 2016-05-03 ENCOUNTER — Telehealth: Payer: Self-pay | Admitting: *Deleted

## 2016-05-03 NOTE — Telephone Encounter (Signed)
  Follow up Call-  Call back number 05/02/2016  Post procedure Call Back phone  # [phone]  Permission to leave phone message Yes     Patient questions:  Do you have a fever, pain , or abdominal swelling? No. Pain Score  0 *  Have you tolerated food without any problems? Yes.    Have you been able to return to your normal activities? Yes.    Do you have any questions about your discharge instructions: Diet   No. Medications  No. Follow up visit  No.  Do you have questions or concerns about your Care? No.  Actions: * If pain score is 4 or above: No action needed, pain <4.

## 2016-05-11 ENCOUNTER — Encounter: Payer: Self-pay | Admitting: Gastroenterology

## 2016-07-20 ENCOUNTER — Ambulatory Visit (INDEPENDENT_AMBULATORY_CARE_PROVIDER_SITE_OTHER): Payer: Medicare Other | Admitting: Cardiovascular Disease

## 2016-07-20 ENCOUNTER — Encounter: Payer: Self-pay | Admitting: Cardiovascular Disease

## 2016-07-20 VITALS — BP 122/62 | HR 56 | Ht 60.0 in | Wt 117.8 lb

## 2016-07-20 DIAGNOSIS — I10 Essential (primary) hypertension: Secondary | ICD-10-CM

## 2016-07-20 DIAGNOSIS — I2510 Atherosclerotic heart disease of native coronary artery without angina pectoris: Secondary | ICD-10-CM | POA: Diagnosis not present

## 2016-07-20 NOTE — Progress Notes (Signed)
Cardiology Office Note   Date:  07/20/2016   ID:  [name] [name], DOB [birth year], MRN [id number]  PCP:  Cari Caraway, MD  Cardiologist:   Mertie Moores, MD   Chief Complaint  Patient presents with  . Coronary Artery Disease  . Hypertension   Problem list: 1. Coronary artery disease-status post PTCA and stenting of her left anterior descending artery (August, 2004), 3.0 x 20 mm Taxus stent inflated up to 12 atmospheres. 2. Hyperlipidemia 3. Breast Cancer 4. Asthma    Feb. 11, 2016  [name] [name] is a 70 y.o. female who presents for follow up of her CAD. She has seen Cecille Rubin for the past several visits.  She has been seen in the ER for dyspnea.   Echo in July, 2015 showed normal LV function with trivial valvular abnormalities. She was started on HCTZ - has had some orthostasis   She has been feeling much better recently . Thinks her potassium was too low. No chest pain . Has some palpitations.    Feb. 10, 2017:  [name] [name] is doing great  Doing great.   BP has been ok. Is elevated here.   Exercises in the summer. Not as much in the winter   No CP or dyspnea.   Aug. 18, 2017:  Doing great. Had shingles ,  Doing better now.   Slow to heal .   Past Medical History:  Diagnosis Date  . Allergic rhinitis   . Anxiety disorder   . Asthma   . Breast cancer ([place])    bilateral  . CAD (coronary artery disease)    a. NSTEMI 8/04 => LHC 07/2003:  mLAD hazy 75% => Taxus DES, EF 30% with ant-apical, apical and inf-apical AK.  b.  Echo 4/05: EF 60%.  c.  ETT/Lexiscan Myoview 06/2011: EF 80%, no ischemia, normal wall motion  . Diverticulosis 11/13/04  . Hyperplastic colon polyp 10/16/99  . Hypertension   . Internal and external hemorrhoids without complication [id number]  . MI (myocardial infarction) ([place]) 2004   Anteroapical MI with PCI to the LAD  . Positive PPD   . PVC (premature ventricular contraction)     Past Surgical History:  Procedure Laterality Date  . APPENDECTOMY    .  bilateral mastectomy    . CARDIOVASCULAR STRESS TEST  07/01/2006   ef 80%  . CORONARY ANGIOPLASTY WITH STENT PLACEMENT  2004   LAD  . US ECHOCARDIOGRAPHY  03/27/2004   resting ef 60%     Current Outpatient Prescriptions  Medication Sig Dispense Refill  . acetaminophen (TYLENOL) 325 MG tablet Take 650 mg by mouth every 6 (six) hours as needed for mild pain.     Marland Kitchen albuterol (PROAIR HFA) 108 (90 BASE) MCG/ACT inhaler Inhale 2 puffs into the lungs every 6 (six) hours as needed for shortness of breath.     Marland Kitchen aspirin 81 MG tablet Take 81 mg by mouth daily.     . calcium carbonate (OS-CAL) 600 MG TABS Take 600 mg by mouth daily. 1Tablet Daily    . Cholecalciferol (VITAMIN D) 2000 UNITS tablet Take 4,000 Units by mouth daily.    Marland Kitchen escitalopram (LEXAPRO) 10 MG tablet Take 10 mg by mouth daily.    Marland Kitchen FLOVENT HFA 110 MCG/ACT inhaler Inhale 1 puff into the lungs 2 (two) times daily.   1  . fluticasone (FLONASE) 50 MCG/ACT nasal spray Place 1 spray into both nostrils daily as needed for allergies.     Marland Kitchen gabapentin (NEURONTIN) 300 MG  capsule Take 300 mg by mouth at bedtime.    . hydrochlorothiazide (HYDRODIURIL) 25 MG tablet Take 1 tablet (25 mg total) by mouth daily. 90 tablet 3  . loratadine (CLARITIN) 10 MG tablet Take 10 mg by mouth daily.    . nebivolol (BYSTOLIC) 5 MG tablet Take 1 tablet (5 mg total) by mouth daily. 30 tablet 6  . Potassium Chloride ER 20 MEQ TBCR Take 20 mEq by mouth 2 (two) times daily. 62 tablet 11  . PREMARIN vaginal cream Place 0.5 Applicatorfuls vaginally 2 (two) times a week. On Tuesday and Saturday     Use as directed    . rosuvastatin (CRESTOR) 5 MG tablet Take 1 tablet (5 mg total) by mouth daily. 30 tablet 6  . valsartan (DIOVAN) 160 MG tablet Take 1 tablet (160 mg total) by mouth daily. 30 tablet 5   No current facility-administered medications for this visit.     Allergies:   Codeine; Lisinopril; and Simvastatin    Social History:  The patient  reports that she  quit smoking about 49 years ago. She has never used smokeless tobacco. She reports that she drinks alcohol. She reports that she does not use drugs.   Family History:  The patient's family history includes Asthma in her daughter; Breast cancer in her mother and sister; Heart disease in her maternal grandmother.    ROS:  Please see the history of present illness.    Review of Systems: Constitutional:  denies fever, chills, diaphoresis, appetite change and fatigue.  HEENT: denies photophobia, eye pain, redness, hearing loss, ear pain, congestion, sore throat, rhinorrhea, sneezing, neck pain, neck stiffness and tinnitus.  Respiratory: denies SOB, DOE, cough, chest tightness, and wheezing.  Cardiovascular: denies chest pain, palpitations and leg swelling.  Gastrointestinal: denies nausea, vomiting, abdominal pain, diarrhea, constipation, blood in stool.  Genitourinary: denies dysuria, urgency, frequency, hematuria, flank pain and difficulty urinating.  Musculoskeletal: denies  myalgias, back pain, joint swelling, arthralgias and gait problem.   Skin: denies pallor, rash and wound.  Neurological: denies dizziness, seizures, syncope, weakness, light-headedness, numbness and headaches.   Hematological: denies adenopathy, easy bruising, personal or family bleeding history.  Psychiatric/ Behavioral: denies suicidal ideation, mood changes, confusion, nervousness, sleep disturbance and agitation.       All other systems are reviewed and negative.    PHYSICAL EXAM: VS:  BP 122/62 (BP Location: Right Arm, Patient Position: Sitting, Cuff Size: Normal)   Pulse (!) 56   Ht 5' (1.524 m)   Wt 117 lb 12.8 oz (53.4 kg)   BMI 23.01 kg/m  , BMI Body mass index is 23.01 kg/m. GEN: Well nourished, well developed, in no acute distress  HEENT: normal  Neck: no JVD, carotid bruits, or masses Cardiac: RRR; no murmurs, rubs, or gallops,no edema  Respiratory:  clear to auscultation bilaterally, normal work  of breathing GI: soft, nontender, nondistended, + BS MS: no deformity or atrophy  Skin: warm and dry, no rash Neuro:  Strength and sensation are intact Psych: normal   EKG:  EKG is not ordered today.    Recent Labs: No results found for requested labs within last 8760 hours.    Lipid Panel    Component Value Date/Time   CHOL 108 02/11/2013 0850   TRIG 41.0 02/11/2013 0850   HDL 53.40 02/11/2013 0850   CHOLHDL 2 02/11/2013 0850   VLDL 8.2 02/11/2013 0850   LDLCALC 46 02/11/2013 0850      Wt Readings from Last 3 Encounters:  07/20/16 117 lb 12.8 oz (53.4 kg)  05/02/16 118 lb (53.5 kg)  04/18/16 118 lb 6.4 oz (53.7 kg)    ECG: 01/13/2016: Sinus bradycardia at 49. Otherwise the EKG is normal.  Other studies Reviewed: Additional studies/ records that were reviewed today include: labs from Dr. Addison Lank. Review of the above records demonstrates: normal lipids   ASSESSMENT AND PLAN:  1. Coronary artery disease-status post PTCA and stenting of her left anterior descending artery (August, 2004), 3.0 x 20 mm Taxus stent inflated up to 12 atmospheres. - She is doing quite well.  she's not had any episodes of angina.  2. Hyperlipidemia -  her lipids were checked by her medical doctor.  Continue current dose of Crestor 5 mg a day  3. Breast Cancer- stable  4. Asthma -  Well controlled.    5. Hypertension: Her blood pressure  Readings have been stable   Current medicines are reviewed at length with the patient today.  The patient does not have concerns regarding medicines.  The following changes have been made:  no change   Disposition:   FU with me in 1 year    Signed, Mertie Moores, MD  07/20/2016 11:03 AM    [facility] [address], [place], [place]  [address] Phone: [phone]; Fax: [phone]

## 2016-07-20 NOTE — Patient Instructions (Addendum)
  Medication Instructions:  Your physician recommends that you continue on your current medications as directed. Please refer to the Current Medication list given to you today.   Labwork: NONE  Testing/Procedures: NONE  Follow-Up: Your physician recommends that you schedule a follow-up appointment in: [place].  If you need a refill on your cardiac medications before your next appointment, please call your pharmacy.

## 2017-08-13 ENCOUNTER — Encounter: Payer: Self-pay | Admitting: Cardiovascular Disease

## 2017-08-13 ENCOUNTER — Ambulatory Visit (INDEPENDENT_AMBULATORY_CARE_PROVIDER_SITE_OTHER): Payer: Medicare Other | Admitting: Cardiovascular Disease

## 2017-08-13 VITALS — BP 130/80 | HR 49 | Ht 61.0 in | Wt 117.6 lb

## 2017-08-13 DIAGNOSIS — I2510 Atherosclerotic heart disease of native coronary artery without angina pectoris: Secondary | ICD-10-CM

## 2017-08-13 DIAGNOSIS — I10 Essential (primary) hypertension: Secondary | ICD-10-CM | POA: Diagnosis not present

## 2017-08-13 NOTE — Progress Notes (Signed)
Cardiology Office Note   Date:  08/13/2017   ID:  [name] [name], DOB [birth year], MRN [id number]  PCP:  Cari Caraway, MD  Cardiologist:   Mertie Moores, MD   Chief Complaint  Patient presents with  . Follow-up    CAD   Problem list: 1. Coronary artery disease-status post PTCA and stenting of her left anterior descending artery (August, 2004), 3.0 x 20 mm Taxus stent inflated up to 12 atmospheres. 2. Hyperlipidemia 3. Breast Cancer 4. Asthma    Feb. 11, 2016  [name] [name] is a 71 y.o. female who presents for follow up of her CAD. She has seen Cecille Rubin for the past several visits.  She has been seen in the ER for dyspnea.   Echo in July, 2015 showed normal LV function with trivial valvular abnormalities. She was started on HCTZ - has had some orthostasis   She has been feeling much better recently . Thinks her potassium was too low. No chest pain . Has some palpitations.    Feb. 10, 2017:  [name] [name] is doing great  Doing great.   BP has been ok. Is elevated here.   Exercises in the summer. Not as much in the winter   No CP or dyspnea.   Aug. 18, 2017:  Doing great. Had shingles ,  Doing better now.   Slow to heal .  Sept. 11, 2018:  [name] [name] is doing well.  Doing better on the Bystolic - was having lots of palpitations . No angina    Past Medical History:  Diagnosis Date  . Allergic rhinitis   . Anxiety disorder   . Asthma   . Breast cancer ([place])    bilateral  . CAD (coronary artery disease)    a. NSTEMI 8/04 => LHC 07/2003:  mLAD hazy 75% => Taxus DES, EF 30% with ant-apical, apical and inf-apical AK.  b.  Echo 4/05: EF 60%.  c.  ETT/Lexiscan Myoview 06/2011: EF 80%, no ischemia, normal wall motion  . Diverticulosis 11/13/04  . Hyperplastic colon polyp 10/16/99  . Hypertension   . Internal and external hemorrhoids without complication 07/17/47  . MI (myocardial infarction) ([place]) 2004   Anteroapical MI with PCI to the LAD  . Positive PPD   . PVC (premature  ventricular contraction)     Past Surgical History:  Procedure Laterality Date  . APPENDECTOMY    . bilateral mastectomy    . CARDIOVASCULAR STRESS TEST  07/01/2006   ef 80%  . CORONARY ANGIOPLASTY WITH STENT PLACEMENT  2004   LAD  . US ECHOCARDIOGRAPHY  03/27/2004   resting ef 60%     Current Outpatient Prescriptions  Medication Sig Dispense Refill  . acetaminophen (TYLENOL) 325 MG tablet Take 650 mg by mouth every 6 (six) hours as needed for mild pain.     Marland Kitchen albuterol (PROAIR HFA) 108 (90 BASE) MCG/ACT inhaler Inhale 2 puffs into the lungs every 6 (six) hours as needed for shortness of breath.     Marland Kitchen aspirin 81 MG tablet Take 81 mg by mouth daily.     . calcium carbonate (OS-CAL) 600 MG TABS Take 600 mg by mouth daily. 1Tablet Daily    . Cholecalciferol (VITAMIN D) 2000 UNITS tablet Take 2,000 Units by mouth daily.     Marland Kitchen escitalopram (LEXAPRO) 10 MG tablet Take 10 mg by mouth daily.    Marland Kitchen FLOVENT HFA 110 MCG/ACT inhaler Inhale 1 puff into the lungs 2 (two) times daily.   1  .  fluticasone (FLONASE) 50 MCG/ACT nasal spray Place 1 spray into both nostrils daily as needed for allergies.     . hydrochlorothiazide (HYDRODIURIL) 25 MG tablet Take 1 tablet (25 mg total) by mouth daily. 90 tablet 3  . irbesartan (AVAPRO) 150 MG tablet Take 150 mg by mouth daily.    Marland Kitchen loratadine (CLARITIN) 10 MG tablet Take 10 mg by mouth daily.    . nebivolol (BYSTOLIC) 5 MG tablet Take 1 tablet (5 mg total) by mouth daily. 30 tablet 6  . Potassium Chloride ER 20 MEQ TBCR Take 20 mEq by mouth 2 (two) times daily. 62 tablet 11  . PREMARIN vaginal cream Place 0.5 Applicatorfuls vaginally 2 (two) times a week. On Tuesday and Saturday     Use as directed    . rosuvastatin (CRESTOR) 5 MG tablet Take 1 tablet (5 mg total) by mouth daily. 30 tablet 6   No current facility-administered medications for this visit.     Allergies:   Codeine; Lisinopril; and Simvastatin    Social History:  The patient  reports that  she quit smoking about 51 years ago. She has never used smokeless tobacco. She reports that she drinks alcohol. She reports that she does not use drugs.   Family History:  The patient's family history includes Asthma in her daughter; Breast cancer in her mother and sister; Heart disease in her maternal grandmother; Spina bifida in her unknown relative.    ROS:  Please see the history of present illness.    Review of Systems: Constitutional:  denies fever, chills, diaphoresis, appetite change and fatigue.  HEENT: denies photophobia, eye pain, redness, hearing loss, ear pain, congestion, sore throat, rhinorrhea, sneezing, neck pain, neck stiffness and tinnitus.  Respiratory: denies SOB, DOE, cough, chest tightness, and wheezing.  Cardiovascular: denies chest pain, palpitations and leg swelling.  Gastrointestinal: denies nausea, vomiting, abdominal pain, diarrhea, constipation, blood in stool.  Genitourinary: denies dysuria, urgency, frequency, hematuria, flank pain and difficulty urinating.  Musculoskeletal: denies  myalgias, back pain, joint swelling, arthralgias and gait problem.   Skin: denies pallor, rash and wound.  Neurological: denies dizziness, seizures, syncope, weakness, light-headedness, numbness and headaches.   Hematological: denies adenopathy, easy bruising, personal or family bleeding history.  Psychiatric/ Behavioral: denies suicidal ideation, mood changes, confusion, nervousness, sleep disturbance and agitation.       All other systems are reviewed and negative.    PHYSICAL EXAM: VS:  BP 130/80   Pulse (!) 49   Ht 5\' 1"  (1.549 m)   Wt 117 lb 9.6 oz (53.3 kg)   BMI 22.22 kg/m  , BMI Body mass index is 22.22 kg/m. GEN: Well nourished, well developed, in no acute distress  HEENT: normal  Neck: no JVD, carotid bruits, or masses Cardiac: RRR; no murmurs, rubs, or gallops,no edema  Respiratory:  clear to auscultation bilaterally, normal work of breathing GI: soft,  nontender, nondistended, + BS MS: no deformity or atrophy  Skin: warm and dry, no rash Neuro:  Strength and sensation are intact Psych: normal   EKG:  EKG is ordered today. Marked sinus brady at 49.  1st degree AV block .  LAD   Recent Labs: No results found for requested labs within last 8760 hours.    Lipid Panel    Component Value Date/Time   CHOL 108 02/11/2013 0850   TRIG 41.0 02/11/2013 0850   HDL 53.40 02/11/2013 0850   CHOLHDL 2 02/11/2013 0850   VLDL 8.2 02/11/2013 0850   LDLCALC  46 02/11/2013 0850      Wt Readings from Last 3 Encounters:  08/13/17 117 lb 9.6 oz (53.3 kg)  07/20/16 117 lb 12.8 oz (53.4 kg)  05/02/16 118 lb (53.5 kg)    ECG: 01/13/2016: Sinus bradycardia at 49. Otherwise the EKG is normal.  Other studies Reviewed: Additional studies/ records that were reviewed today include: labs from Dr. Addison Lank. Review of the above records demonstrates: normal lipids   ASSESSMENT AND PLAN:  1. Coronary artery disease-status post PTCA and stenting of her left anterior descending artery (August, 2004), 3.0 x 20 mm Taxus stent inflated up to 12 atmospheres. - She is doing quite well.  she's not had any episodes of angina.  2. Hyperlipidemia -  her lipids were checked by her medical doctor.  Continue current dose of Crestor 5 mg a day  3. Palpitations:  Better with the Bystolic 5 mg a day  HR is slow - no syncope   4. Asthma -  Well controlled.   5. Hypertension: changing from Valsartan to irbesartan 150 .   Current medicines are reviewed at length with the patient today.  The patient does not have concerns regarding medicines.  The following changes have been made:  no change   Disposition:   FU with me in 1 year    Signed, Mertie Moores, MD  08/13/2017 10:57 AM    [facility] [place], [place], [place]  [address] Phone: [phone]; Fax: [phone]

## 2017-08-13 NOTE — Patient Instructions (Signed)
Medication Instructions:  Your physician recommends that you continue on your current medications as directed. Please refer to the Current Medication list given to you today.   Labwork: None Ordered   Testing/Procedures: None Ordered   Follow-Up: Your physician wants you to follow-up in: 1 year with Dr. Nahser.  You will receive a reminder letter in the mail two months in advance. If you don't receive a letter, please call our office to schedule the follow-up appointment.   If you need a refill on your cardiac medications before your next appointment, please call your pharmacy.   Thank you for choosing [facility]! Taegen Delker, RN [phone]    

## 2018-07-22 ENCOUNTER — Encounter: Payer: Self-pay | Admitting: Cardiovascular Disease

## 2018-08-14 ENCOUNTER — Encounter: Payer: Self-pay | Admitting: Cardiovascular Disease

## 2018-08-14 ENCOUNTER — Ambulatory Visit: Payer: Medicare Other | Admitting: Cardiovascular Disease

## 2018-08-14 VITALS — BP 130/72 | HR 51 | Ht 61.0 in | Wt 120.0 lb

## 2018-08-14 DIAGNOSIS — E785 Hyperlipidemia, unspecified: Secondary | ICD-10-CM

## 2018-08-14 DIAGNOSIS — I2510 Atherosclerotic heart disease of native coronary artery without angina pectoris: Secondary | ICD-10-CM

## 2018-08-14 NOTE — Progress Notes (Signed)
Cardiology Office Note   Date:  08/14/2018   ID:  [name] [name], DOB [birth year], MRN [id number]  PCP:  Cari Caraway, MD  Cardiologist:   Mertie Moores, MD   No chief complaint on file.  Problem list: 1. Coronary artery disease-status post PTCA and stenting of her left anterior descending artery (August, 2004), 3.0 x 20 mm Taxus stent inflated up to 12 atmospheres. 2. Hyperlipidemia 3. Breast Cancer 4. Asthma    Feb. 11, 2016  [name] [name] is a 72 y.o. female who presents for follow up of her CAD. She has seen Cecille Rubin for the past several visits.  She has been seen in the ER for dyspnea.   Echo in July, 2015 showed normal LV function with trivial valvular abnormalities. She was started on HCTZ - has had some orthostasis   She has been feeling much better recently . Thinks her potassium was too low. No chest pain . Has some palpitations.    Feb. 10, 2017:  [name] [name] is doing great  Doing great.   BP has been ok. Is elevated here.   Exercises in the summer. Not as much in the winter   No CP or dyspnea.   Aug. 18, 2017:  Doing great. Had shingles ,  Doing better now.   Slow to heal .  Sept. 11, 2018:  [name] [name] is doing well.  Doing better on the Bystolic - was having lots of palpitations . No angina   Sept. 12, 2019:  Slowing down some, Still gardening  -  [place] , She brought lab work from [employer].  Her vitamin D level is 56.9.  Total cholesterol is 135.  Triglyceride level is 53.  LDL is 65.  HDL is 60. Electrolyte panel looks good.  Glucose is 90.  Creatinine is 0.8.  Sodium is 141.  Potassium is 3.9.   Past Medical History:  Diagnosis Date  . Allergic rhinitis   . Anxiety disorder   . Asthma   . Breast cancer ([place])    bilateral  . CAD (coronary artery disease)    a. NSTEMI 8/04 => LHC 07/2003:  mLAD hazy 75% => Taxus DES, EF 30% with ant-apical, apical and inf-apical AK.  b.  Echo 4/05: EF 60%.  c.  ETT/Lexiscan Myoview 06/2011: EF 80%, no ischemia,  normal wall motion  . Diverticulosis 11/13/04  . Hyperplastic colon polyp 10/16/99  . Hypertension   . Internal and external hemorrhoids without complication 94/76/54  . MI (myocardial infarction) ([place]) 2004   Anteroapical MI with PCI to the LAD  . Positive PPD   . PVC (premature ventricular contraction)     Past Surgical History:  Procedure Laterality Date  . APPENDECTOMY    . bilateral mastectomy    . CARDIOVASCULAR STRESS TEST  07/01/2006   ef 80%  . CORONARY ANGIOPLASTY WITH STENT PLACEMENT  2004   LAD  . US ECHOCARDIOGRAPHY  03/27/2004   resting ef 60%     Current Outpatient Medications  Medication Sig Dispense Refill  . acetaminophen (TYLENOL) 325 MG tablet Take 650 mg by mouth every 6 (six) hours as needed for mild pain.     Marland Kitchen albuterol (PROAIR HFA) 108 (90 BASE) MCG/ACT inhaler Inhale 2 puffs into the lungs every 6 (six) hours as needed for shortness of breath.     Marland Kitchen aspirin 81 MG tablet Take 81 mg by mouth daily.     . calcium carbonate (OS-CAL) 600 MG TABS Take 600 mg by mouth  daily. 1Tablet Daily    . Cholecalciferol (VITAMIN D) 2000 UNITS tablet Take 2,000 Units by mouth daily.     Marland Kitchen denosumab (PROLIA) 60 MG/ML SOSY injection Inject 60 mg into the skin every 6 (six) months.    . escitalopram (LEXAPRO) 10 MG tablet Take 10 mg by mouth daily.    Marland Kitchen FLOVENT HFA 110 MCG/ACT inhaler Inhale 1 puff into the lungs 2 (two) times daily.   1  . fluticasone (FLONASE) 50 MCG/ACT nasal spray Place 1 spray into both nostrils daily as needed for allergies.     . hydrochlorothiazide (HYDRODIURIL) 25 MG tablet Take 1 tablet (25 mg total) by mouth daily. 90 tablet 3  . irbesartan (AVAPRO) 150 MG tablet Take 150 mg by mouth daily.    Marland Kitchen loratadine (CLARITIN) 10 MG tablet Take 10 mg by mouth daily.    . nebivolol (BYSTOLIC) 5 MG tablet Take 1 tablet (5 mg total) by mouth daily. 30 tablet 6  . Potassium Chloride ER 20 MEQ TBCR Take 20 mEq by mouth 2 (two) times daily. 62 tablet 11  .  PREMARIN vaginal cream Place 0.5 Applicatorfuls vaginally 2 (two) times a week. On Tuesday and Saturday     Use as directed    . rosuvastatin (CRESTOR) 5 MG tablet Take 1 tablet (5 mg total) by mouth daily. 30 tablet 6   No current facility-administered medications for this visit.     Allergies:   Codeine; Lisinopril; and Simvastatin    Social History:  The patient  reports that she quit smoking about 52 years ago. She has never used smokeless tobacco. She reports that she drinks alcohol. She reports that she does not use drugs.   Family History:  The patient's family history includes Asthma in her daughter; Breast cancer in her mother and sister; Heart disease in her maternal grandmother; Spina bifida in her unknown relative.    ROS:  Please see the history of present illness.        Physical Exam: Blood pressure 130/72, pulse (!) 51, height 5\' 1"  (1.549 m), weight 120 lb (54.4 kg), SpO2 97 %.  GEN:  Well nourished, well developed in no acute distress HEENT: Normal NECK: No JVD; No carotid bruits LYMPHATICS: No lymphadenopathy CARDIAC: RRR , no murmurs, rubs, gallops RESPIRATORY:  Clear to auscultation without rales, wheezing or rhonchi  ABDOMEN: Soft, non-tender, non-distended MUSCULOSKELETAL:  No edema; No deformity  SKIN: Warm and dry NEUROLOGIC:  Alert and oriented x 3   EKG:   August 14, 2018: Sinus bradycardia 51 beats minute.  No ST or T wave changes.  Recent Labs: No results found for requested labs within last 8760 hours.    Lipid Panel    Component Value Date/Time   CHOL 108 02/11/2013 0850   TRIG 41.0 02/11/2013 0850   HDL 53.40 02/11/2013 0850   CHOLHDL 2 02/11/2013 0850   VLDL 8.2 02/11/2013 0850   LDLCALC 46 02/11/2013 0850      Wt Readings from Last 3 Encounters:  08/14/18 120 lb (54.4 kg)  08/13/17 117 lb 9.6 oz (53.3 kg)  07/20/16 117 lb 12.8 oz (53.4 kg)    ECG: 01/13/2016: Sinus bradycardia at 49. Otherwise the EKG is normal.  Other  studies Reviewed: Additional studies/ records that were reviewed today include: labs from Dr. Addison Lank. Review of the above records demonstrates: normal lipids   ASSESSMENT AND PLAN:  1. Coronary artery disease -   S/p PCI of her LAD in 2004.  2. Hyperlipidemia -  Labs from Dr. Leonides Schanz look great   3. Palpitations: - stable   4. Asthma -     5. Hypertension:  BP looks great    Current medicines are reviewed at length with the patient today.  The patient does not have concerns regarding medicines.  The following changes have been made:  no change   Disposition:   FU with me in 1 year    Signed, Mertie Moores, MD  08/14/2018 10:40 AM    [facility] [place], [place], [place]  [address] Phone: [phone]; Fax: [phone]

## 2018-08-14 NOTE — Patient Instructions (Signed)
Medication Instructions:  Your physician recommends that you continue on your current medications as directed. Please refer to the Current Medication list given to you today.   Labwork: None ordered  Testing/Procedures: None ordered  Follow-Up: Your physician wants you to follow-up in: 1 year with Dr. Acie Fredrickson. You will receive a reminder letter in the mail two months in advance. If you don't receive a letter, please call our office to schedule the follow-up appointment.   Any Other Special Instructions Will Be Listed Below (If Applicable).     If you need a refill on your cardiac medications before your next appointment, please call your pharmacy. \

## 2019-09-04 ENCOUNTER — Ambulatory Visit: Payer: Medicare Other | Admitting: Cardiovascular Disease

## 2019-09-04 ENCOUNTER — Other Ambulatory Visit: Payer: Self-pay

## 2019-09-04 ENCOUNTER — Encounter: Payer: Self-pay | Admitting: Cardiovascular Disease

## 2019-09-04 VITALS — BP 140/64 | HR 52 | Ht 61.0 in | Wt 124.8 lb

## 2019-09-04 DIAGNOSIS — I2510 Atherosclerotic heart disease of native coronary artery without angina pectoris: Secondary | ICD-10-CM

## 2019-09-04 DIAGNOSIS — E785 Hyperlipidemia, unspecified: Secondary | ICD-10-CM

## 2019-09-04 NOTE — Patient Instructions (Signed)
Medication Instructions:  Your physician recommends that you continue on your current medications as directed. Please refer to the Current Medication list given to you today.  If you need a refill on your cardiac medications before your next appointment, please call your pharmacy.   Lab work: None Ordered    Testing/Procedures: None Ordered   Follow-Up: At [facility], you and your health needs are our priority.  As part of our continuing mission to provide you with exceptional heart care, we have created designated Provider Care Teams.  These Care Teams include your primary Cardiologist (physician) and Advanced Practice Providers (APPs -  Physician Assistants and Nurse Practitioners) who all work together to provide you with the care you need, when you need it. You will need a follow up appointment in:  1 years.  Please call our office 2 months in advance to schedule this appointment.  You may see Dr. Nahser or one of the following Advanced Practice Providers on your designated Care Team: Scott Weaver, PA-C Vin Bhagat, PA-C . Janine Hammond, NP    

## 2019-09-04 NOTE — Progress Notes (Signed)
Cardiology Office Note   Date:  09/04/2019   ID:  TANSEY KASTER, DOB 1945/12/14, MRN [id number]  PCP:  Cari Caraway, MD  Cardiologist:   Mertie Moores, MD   No chief complaint on file.  Problem list: 1. Coronary artery disease-status post PTCA and stenting of her left anterior descending artery (August, 2004), 3.0 x 20 mm Taxus stent inflated up to 12 atmospheres. 2. Hyperlipidemia 3. Breast Cancer 4. Asthma    Feb. 11, 2016  [name] is a 73 y.o. female who presents for follow up of her CAD. She has seen Cecille Rubin for the past several visits.  She has been seen in the ER for dyspnea.   Echo in July, 2015 showed normal LV function with trivial valvular abnormalities. She was started on HCTZ - has had some orthostasis   She has been feeling much better recently . Thinks her potassium was too low. No chest pain . Has some palpitations.    Feb. 10, 2017:  [name] is doing great  Doing great.   BP has been ok. Is elevated here.   Exercises in the summer. Not as much in the winter   No CP or dyspnea.   Aug. 18, 2017:  Doing great. Had shingles ,  Doing better now.   Slow to heal .  Sept. 11, 2018:  [name] is doing well.  Doing better on the Bystolic - was having lots of palpitations . No angina   Sept. 12, 2019:  Slowing down some, Still gardening  -  Flow[place] , She brought lab work from [employer].  Her vitamin D level is 56.9.  Total cholesterol is 135.  Triglyceride level is 53.  LDL is 65.  HDL is 60. Electrolyte panel looks good.  Glucose is 90.  Creatinine is 0.8.  Sodium is 141.  Potassium is 3.9.  Oct. 2 2020   [name] is seen for follow   She brought labs from her primary medical doctor's office from February 10, 2019.  Her total cholesterol is 120 HDL is 52 Triglyceride level is 57 LDL is 57. Glucose level is 86.  Creatinine 0.82.  Sodium and potassium are within normal limits.  Liver enzymes are normal.  Vitamin D level was 70.  Past Medical  History:  Diagnosis Date  . Allergic rhinitis   . Anxiety disorder   . Asthma   . Breast cancer ([place])    bilateral  . CAD (coronary artery disease)    a. NSTEMI 8/04 => LHC 07/2003:  mLAD hazy 75% => Taxus DES, EF 30% with ant-apical, apical and inf-apical AK.  b.  Echo 4/05: EF 60%.  c.  ETT/Lexiscan Myoview 06/2011: EF 80%, no ischemia, normal wall motion  . Diverticulosis 11/13/04  . Hyperplastic colon polyp 10/16/99  . Hypertension   . Internal and external hemorrhoids without complication [id number]  . MI (myocardial infarction) ([place]) 2004   Anteroapical MI with PCI to the LAD  . Positive PPD   . PVC (premature ventricular contraction)     Past Surgical History:  Procedure Laterality Date  . APPENDECTOMY    . bilateral mastectomy    . CARDIOVASCULAR STRESS TEST  07/01/2006   ef 80%  . CORONARY ANGIOPLASTY WITH STENT PLACEMENT  2004   LAD  . US ECHOCARDIOGRAPHY  03/27/2004   resting ef 60%     Current Outpatient Medications  Medication Sig Dispense Refill  . acetaminophen (TYLENOL) 325 MG tablet Take 650 mg by mouth every 6 (  six) hours as needed for mild pain.     Marland Kitchen albuterol (PROAIR HFA) 108 (90 BASE) MCG/ACT inhaler Inhale 2 puffs into the lungs every 6 (six) hours as needed for shortness of breath.     Marland Kitchen aspirin 81 MG tablet Take 81 mg by mouth daily.     . calcium carbonate (OS-CAL) 600 MG TABS Take 600 mg by mouth daily. 1Tablet Daily    . Cholecalciferol (VITAMIN D) 2000 UNITS tablet Take 2,000 Units by mouth daily.     Marland Kitchen denosumab (PROLIA) 60 MG/ML SOSY injection Inject 60 mg into the skin every 6 (six) months.    . escitalopram (LEXAPRO) 10 MG tablet Take 10 mg by mouth daily.    Marland Kitchen FLOVENT HFA 110 MCG/ACT inhaler Inhale 1 puff into the lungs 2 (two) times daily.   1  . fluticasone (FLONASE) 50 MCG/ACT nasal spray Place 1 spray into both nostrils daily as needed for allergies.     . hydrochlorothiazide (HYDRODIURIL) 25 MG tablet Take 1 tablet (25 mg total) by mouth  daily. 90 tablet 3  . irbesartan (AVAPRO) 150 MG tablet Take 150 mg by mouth daily.    Marland Kitchen loratadine (CLARITIN) 10 MG tablet Take 10 mg by mouth daily.    . nebivolol (BYSTOLIC) 5 MG tablet Take 1 tablet (5 mg total) by mouth daily. 30 tablet 6  . nitroGLYCERIN (NITROSTAT) 0.4 MG SL tablet Place 0.4 mg under the tongue as needed.    . Potassium Chloride ER 20 MEQ TBCR Take 20 mEq by mouth 2 (two) times daily. 62 tablet 11  . PREMARIN vaginal cream Place 0.5 Applicatorfuls vaginally 2 (two) times a week. On Tuesday and Saturday     Use as directed    . rosuvastatin (CRESTOR) 5 MG tablet Take 1 tablet (5 mg total) by mouth daily. 30 tablet 6   No current facility-administered medications for this visit.     Allergies:   Codeine, Lisinopril, and Simvastatin    Social History:  The patient  reports that she quit smoking about 53 years ago. She has never used smokeless tobacco. She reports current alcohol use. She reports that she does not use drugs.   Family History:  The patient's family history includes Asthma in her daughter; Breast cancer in her mother and sister; Heart disease in her maternal grandmother; Spina bifida in her unknown relative.    ROS:  Please see the history of present illness.    Physical Exam: Blood pressure 140/64, pulse (!) 52, height 5\' 1"  (1.549 m), weight 124 lb 12.8 oz (56.6 kg), SpO2 96 %.  GEN:  Well nourished, well developed in no acute distress HEENT: Normal NECK: No JVD; No carotid bruits LYMPHATICS: No lymphadenopathy CARDIAC: RRR   RESPIRATORY:  Clear to auscultation without rales, wheezing or rhonchi  ABDOMEN: Soft, non-tender, non-distended MUSCULOSKELETAL:  No edema; No deformity  SKIN: Warm and dry NEUROLOGIC:  Alert and oriented x 3   EKG:   September 04, 2019: Sinus bradycardia 52 beats a minute.  First-degree AV block.  Minimal voltage criteria for LVH.  Possible septal wall myocardial infarction.  Significant artifact in the leads V5 and V6.   Recent Labs: No results found for requested labs within last 8760 hours.    Lipid Panel    Component Value Date/Time   CHOL 108 02/11/2013 0850   TRIG 41.0 02/11/2013 0850   HDL 53.40 02/11/2013 0850   CHOLHDL 2 02/11/2013 0850   VLDL 8.2 02/11/2013 0850  LDLCALC 46 02/11/2013 0850      Wt Readings from Last 3 Encounters:  09/04/19 124 lb 12.8 oz (56.6 kg)  08/14/18 120 lb (54.4 kg)  08/13/17 117 lb 9.6 oz (53.3 kg)    Other studies Reviewed: Additional studies/ records that were reviewed today include: labs from Dr. Addison Lank. Review of the above records demonstrates: normal lipids   ASSESSMENT AND PLAN:  1. Coronary artery disease -   S/p PCI of her LAD in 2004.  No angina   Is very active, no cp   2. Hyperlipidemia -  Recent labs look great   3. Palpitations: -  No recent palpitations   4. Asthma -     5. Hypertension:   BP looks great   Current medicines are reviewed at length with the patient today.  The patient does not have concerns regarding medicines.  The following changes have been made:  no change   Disposition:   FU with me in 1 year    Signed, Mertie Moores, MD  09/04/2019 3:59 PM    [facility] [place], [place], [place]  [address] Phone: [phone]; Fax: [phone]

## 2020-01-01 ENCOUNTER — Ambulatory Visit: Payer: Medicare Other

## 2020-01-08 ENCOUNTER — Ambulatory Visit: Payer: Medicare Other | Attending: Internal Medicine

## 2020-01-08 DIAGNOSIS — Z23 Encounter for immunization: Secondary | ICD-10-CM

## 2020-01-08 NOTE — Progress Notes (Signed)
   Covid-19 Vaccination Clinic  Name:  [name] [name]    MRN: [id number] DOB: 1946/04/21  01/08/2020  [name] was observed post Covid-19 immunization for 15 minutes without incidence. She was provided with Vaccine Information Sheet and instruction to access the V-Safe system.   [name] was instructed to call 911 with any severe reactions post vaccine: Marland Kitchen Difficulty breathing  . Swelling of your face and throat  . A fast heartbeat  . A bad rash all over your body  . Dizziness and weakness    Immunizations Administered    Name Date Dose VIS Date Route   Pfizer COVID-19 Vaccine 01/08/2020  6:04 PM 0.3 mL 11/13/2019 Intramuscular   Manufacturer: [employer]   Lot: [id number]   [place]: [id number]

## 2020-02-02 ENCOUNTER — Ambulatory Visit: Payer: Medicare Other | Attending: Internal Medicine

## 2020-02-02 ENCOUNTER — Ambulatory Visit: Payer: Medicare Other

## 2020-02-02 DIAGNOSIS — Z23 Encounter for immunization: Secondary | ICD-10-CM

## 2020-02-02 NOTE — Progress Notes (Signed)
   Covid-19 Vaccination Clinic  Name:  [name] [name]    MRN: [id number] DOB: [birth year]  02/02/2020  [name] was observed post Covid-19 immunization for 15 minutes without incident. She was provided with Vaccine Information Sheet and instruction to access the V-Safe system.   [name] was instructed to call 911 with any severe reactions post vaccine: Marland Kitchen Difficulty breathing  . Swelling of face and throat  . A fast heartbeat  . A bad rash all over body  . Dizziness and weakness   Immunizations Administered    Name Date Dose VIS Date Route   Pfizer COVID-19 Vaccine 02/02/2020  2:01 PM 0.3 mL 11/13/2019 Intramuscular   Manufacturer: [place]   Lot: [id number]   [place]: [id number]

## 2020-03-02 ENCOUNTER — Other Ambulatory Visit: Payer: Self-pay | Admitting: Family Medicine

## 2020-03-02 DIAGNOSIS — M810 Age-related osteoporosis without current pathological fracture: Secondary | ICD-10-CM

## 2020-06-15 ENCOUNTER — Other Ambulatory Visit: Payer: Self-pay

## 2020-06-15 ENCOUNTER — Ambulatory Visit
Admission: RE | Admit: 2020-06-15 | Discharge: 2020-06-15 | Disposition: A | Discharge status: Home / Self Care | Payer: Medicare Other | Source: Ambulatory Visit | Attending: Family Medicine | Admitting: Family Medicine

## 2020-06-15 DIAGNOSIS — M810 Age-related osteoporosis without current pathological fracture: Secondary | ICD-10-CM

## 2020-09-04 ENCOUNTER — Encounter: Payer: Self-pay | Admitting: Cardiovascular Disease

## 2020-09-04 NOTE — Progress Notes (Signed)
Cardiology Office Note   Date:  09/05/2020   ID:  [name] [name] [place], [place] 10/21/1946, MRN [id number]  PCP:  Cari Caraway, MD  Cardiologist:   Mertie Moores, MD   Chief Complaint  Patient presents with  . Coronary Artery Disease   Problem list: 1. Coronary artery disease-status post PTCA and stenting of her left anterior descending artery (August, 2004), 3.0 x 20 mm Taxus stent inflated up to 12 atmospheres. 2. Hyperlipidemia 3. Breast Cancer 4. Asthma    Feb. 11, 2016  [name] [name] is a 74 y.o. female who presents for follow up of her CAD. She has seen Cecille Rubin for the past several visits.  She has been seen in the ER for dyspnea.   Echo in July, 2015 showed normal LV function with trivial valvular abnormalities. She was started on HCTZ - has had some orthostasis   She has been feeling much better recently . Thinks her potassium was too low. No chest pain . Has some palpitations.    Feb. 10, 2017:  [name] [name] is doing great  Doing great.   BP has been ok. Is elevated here.   Exercises in the summer. Not as much in the winter   No CP or dyspnea.   Aug. 18, 2017:  Doing great. Had shingles ,  Doing better now.   Slow to heal .  Sept. 11, 2018:  [name] [name] is doing well.  Doing better on the Bystolic - was having lots of palpitations . No angina   Sept. 12, 2019:  Slowing down some, Still gardening  -  [place] , She brought lab work from [employer].  Her vitamin D level is 56.9.  Total cholesterol is 135.  Triglyceride level is 53.  LDL is 65.  HDL is 60. Electrolyte panel looks good.  Glucose is 90.  Creatinine is 0.8.  Sodium is 141.  Potassium is 3.9.  Oct. 2 2020   [name] [name] is seen for follow   She brought labs from her primary medical doctor's office from February 10, 2019.  Her total cholesterol is 120 HDL is 52 Triglyceride level is 57 LDL is 57. Glucose level is 86.  Creatinine 0.82.  Sodium and potassium are within normal limits.  Liver enzymes are normal.   Vitamin D level was 70.  Oct. 4, 2021: [name] [name] is seen today for follow up of her CAD and HLD   troubles with asthma No cp, dyspnea, presyncope  Labs from Dr. Addison Lank were reviewed.  Cholesterol levels equals 122 HDL is 44 Triglyceride level is 66 LDL is 65.  Basic metabolic profile is relatively stable.  Sodium is 140.  Potassium is 3.7.  Creatinine is 1.0.   Past Medical History:  Diagnosis Date  . Allergic rhinitis   . Anxiety disorder   . Asthma   . Breast cancer ([place])    bilateral  . CAD (coronary artery disease)    a. NSTEMI 8/04 => LHC 07/2003:  mLAD hazy 75% => Taxus DES, EF 30% with ant-apical, apical and inf-apical AK.  b.  Echo 4/05: EF 60%.  c.  ETT/Lexiscan Myoview 06/2011: EF 80%, no ischemia, normal wall motion  . Diverticulosis 11/13/04  . Hyperplastic colon polyp 10/16/99  . Hypertension   . Internal and external hemorrhoids without complication 63/33/54  . MI (myocardial infarction) ([place]) 2004   Anteroapical MI with PCI to the LAD  . Positive PPD   . PVC (premature ventricular contraction)     Past Surgical History:  Procedure Laterality Date  . APPENDECTOMY    . bilateral mastectomy    . CARDIOVASCULAR STRESS TEST  07/01/2006   ef 80%  . CORONARY ANGIOPLASTY WITH STENT PLACEMENT  2004   LAD  . US ECHOCARDIOGRAPHY  03/27/2004   resting ef 60%     Current Outpatient Medications  Medication Sig Dispense Refill  . acetaminophen (TYLENOL) 325 MG tablet Take 650 mg by mouth every 6 (six) hours as needed for mild pain.     Marland Kitchen albuterol (PROAIR HFA) 108 (90 BASE) MCG/ACT inhaler Inhale 2 puffs into the lungs every 6 (six) hours as needed for shortness of breath.     Marland Kitchen aspirin 81 MG tablet Take 81 mg by mouth daily.     . calcium carbonate (OS-CAL) 600 MG TABS Take 600 mg by mouth daily. 1Tablet Daily    . denosumab (PROLIA) 60 MG/ML SOSY injection Inject 60 mg into the skin every 6 (six) months.    . escitalopram (LEXAPRO) 10 MG tablet Take 10 mg by mouth  daily.    . fluticasone (FLONASE) 50 MCG/ACT nasal spray Place 1 spray into both nostrils daily as needed for allergies.     . hydrochlorothiazide (HYDRODIURIL) 25 MG tablet Take 1 tablet (25 mg total) by mouth daily. 90 tablet 3  . irbesartan (AVAPRO) 150 MG tablet Take 150 mg by mouth daily.    Marland Kitchen levocetirizine (XYZAL) 5 MG tablet Take 5 mg by mouth every evening.    . metroNIDAZOLE (METROGEL) 0.75 % gel Apply 1 application topically at bedtime.    . nitroGLYCERIN (NITROSTAT) 0.4 MG SL tablet Place 0.4 mg under the tongue as needed.    . Potassium Chloride ER 20 MEQ TBCR Take 20 mEq by mouth 2 (two) times daily. 62 tablet 11  . potassium chloride SA (KLOR-CON) 20 MEQ tablet Take 20 mEq by mouth 2 (two) times daily.    Marland Kitchen PREMARIN vaginal cream Place 0.5 Applicatorfuls vaginally 2 (two) times a week. On Tuesday and Saturday     Use as directed    . rosuvastatin (CRESTOR) 5 MG tablet Take 1 tablet (5 mg total) by mouth daily. 30 tablet 6  . trimethoprim (TRIMPEX) 100 MG tablet Take 100 mg by mouth daily.    Marland Kitchen sulfamethoxazole-trimethoprim (BACTRIM) 400-80 MG tablet Take 1 tablet by mouth daily.     No current facility-administered medications for this visit.    Allergies:   Codeine, Lisinopril, and Simvastatin    Social History:  The patient  reports that she quit smoking about 54 years ago. She has never used smokeless tobacco. She reports current alcohol use. She reports that she does not use drugs.   Family History:  The patient's family history includes Asthma in her daughter; Breast cancer in her mother and sister; Heart disease in her maternal grandmother; Spina bifida in her unknown relative.    ROS:  Please see the history of present illness.    Physical Exam: Blood pressure 112/82, pulse (!) 50, height 5' 0.25" (1.53 m), weight 131 lb (59.4 kg), SpO2 96 %.  GEN:  Well nourished, well developed in no acute distress HEENT: Normal NECK: No JVD; No carotid bruits LYMPHATICS: No  lymphadenopathy CARDIAC: RRR , no murmurs, rubs, gallops RESPIRATORY:  Clear to auscultation without rales, wheezing or rhonchi  ABDOMEN: Soft, non-tender, non-distended MUSCULOSKELETAL:  No edema; No deformity  SKIN: Warm and dry NEUROLOGIC:  Alert and oriented x 3    EKG:   September 05, 2020:  Sinus bradycardia 50 beats minute.  Nonspecific ST wave changes.  Recent Labs: No results found for requested labs within last 8760 hours.    Lipid Panel    Component Value Date/Time   CHOL 108 02/11/2013 0850   TRIG 41.0 02/11/2013 0850   HDL 53.40 02/11/2013 0850   CHOLHDL 2 02/11/2013 0850   VLDL 8.2 02/11/2013 0850   LDLCALC 46 02/11/2013 0850      Wt Readings from Last 3 Encounters:  09/05/20 131 lb (59.4 kg)  09/04/19 124 lb 12.8 oz (56.6 kg)  08/14/18 120 lb (54.4 kg)    Other studies Reviewed: Additional studies/ records that were reviewed today include: labs from Dr. Addison Lank. Review of the above records demonstrates: normal lipids   ASSESSMENT AND PLAN:  1. Coronary artery disease -   S/p PCI of her LAD in 2004.  No angina.   Feels wel.    2. Hyperlipidemia -   Labs from Dr. Addison Lank look great   3. Palpitations: -  No further palpos   4. Asthma -     5. Hypertension:   Blood pressure is well controlled.  Her heart rate is slow at 50.  We will reduce the Bystolic to 2.5 mg a day.  We will consider stopping the Bystolic if her blood pressure remains controlled.  Current medicines are reviewed at length with the patient today.  The patient does not have concerns regarding medicines.  The following changes have been made:  no change   Disposition:      Signed, Mertie Moores, MD  09/05/2020 1:46 PM    [facility] [place], [place], [place]  [address] Phone: [phone]; Fax: [phone]

## 2020-09-05 ENCOUNTER — Other Ambulatory Visit: Payer: Self-pay

## 2020-09-05 ENCOUNTER — Encounter: Payer: Self-pay | Admitting: Cardiovascular Disease

## 2020-09-05 ENCOUNTER — Ambulatory Visit (INDEPENDENT_AMBULATORY_CARE_PROVIDER_SITE_OTHER): Payer: Medicare Other | Admitting: Cardiovascular Disease

## 2020-09-05 VITALS — BP 112/82 | HR 50 | Ht 60.25 in | Wt 131.0 lb

## 2020-09-05 DIAGNOSIS — I2510 Atherosclerotic heart disease of native coronary artery without angina pectoris: Secondary | ICD-10-CM | POA: Diagnosis not present

## 2020-09-05 DIAGNOSIS — E785 Hyperlipidemia, unspecified: Secondary | ICD-10-CM | POA: Diagnosis not present

## 2020-09-05 DIAGNOSIS — I10 Essential (primary) hypertension: Secondary | ICD-10-CM

## 2020-09-05 MED ORDER — NEBIVOLOL HCL 2.5 MG PO TABS: 2.5000 mg | ORAL_TABLET | Freq: Every day | ORAL | 11 refills | Status: DC

## 2020-09-05 NOTE — Patient Instructions (Signed)
Medication Instructions:  Your physician has recommended you make the following change in your medication:  DECREASE your Bystolic to 2.5 mg once daily  *If you need a refill on your cardiac medications before your next appointment, please call your pharmacy*   Lab Work: None Ordered If you have labs (blood work) drawn today and your tests are completely normal, you will receive your results only by: Marland Kitchen MyChart Message (if you have MyChart) OR . A paper copy in the mail If you have any lab test that is abnormal or we need to change your treatment, we will call you to review the results.   Testing/Procedures: Measure your heart rate and BP daily after you decrease your medication. Wait at least 1-2 hours after you take your medication.  HOW TO TAKE YOUR BLOOD PRESSURE:  Rest 5 minutes before taking your blood pressure.   Don't smoke or drink caffeinated beverages for at least 30 minutes before.  Take your blood pressure before (not after) you eat.  Sit comfortably with your back supported and both feet on the floor (don't cross your legs).  Elevate your arm to heart level on a table or a desk.  Use the proper sized cuff. It should fit smoothly and snugly around your bare upper arm. There should be enough room to slip a fingertip under the cuff. The bottom edge of the cuff should be 1 inch above the crease of the elbow.   Follow-Up: At [facility], you and your health needs are our priority.  As part of our continuing mission to provide you with exceptional heart care, we have created designated Provider Care Teams.  These Care Teams include your primary Cardiologist (physician) and Advanced Practice Providers (APPs -  Physician Assistants and Nurse Practitioners) who all work together to provide you with the care you need, when you need it.   Your next appointment:   1 year(s)  The format for your next appointment:   In Person  Provider:   You may see Mertie Moores, MD or  one of the following Advanced Practice Providers on your designated Care Team:    Richardson Dopp, PA-C  [place], [place]

## 2021-01-06 DIAGNOSIS — M810 Age-related osteoporosis without current pathological fracture: Secondary | ICD-10-CM | POA: Diagnosis not present

## 2021-02-21 DIAGNOSIS — M25562 Pain in left knee: Secondary | ICD-10-CM | POA: Diagnosis not present

## 2021-02-21 DIAGNOSIS — M25561 Pain in right knee: Secondary | ICD-10-CM | POA: Diagnosis not present

## 2021-02-23 DIAGNOSIS — I10 Essential (primary) hypertension: Secondary | ICD-10-CM | POA: Diagnosis not present

## 2021-02-23 DIAGNOSIS — E673 Hypervitaminosis D: Secondary | ICD-10-CM | POA: Diagnosis not present

## 2021-02-23 DIAGNOSIS — E782 Mixed hyperlipidemia: Secondary | ICD-10-CM | POA: Diagnosis not present

## 2021-03-02 DIAGNOSIS — N390 Urinary tract infection, site not specified: Secondary | ICD-10-CM | POA: Diagnosis not present

## 2021-03-02 DIAGNOSIS — E782 Mixed hyperlipidemia: Secondary | ICD-10-CM | POA: Diagnosis not present

## 2021-03-02 DIAGNOSIS — K589 Irritable bowel syndrome without diarrhea: Secondary | ICD-10-CM | POA: Diagnosis not present

## 2021-03-02 DIAGNOSIS — H9193 Unspecified hearing loss, bilateral: Secondary | ICD-10-CM | POA: Diagnosis not present

## 2021-03-02 DIAGNOSIS — I10 Essential (primary) hypertension: Secondary | ICD-10-CM | POA: Diagnosis not present

## 2021-03-02 DIAGNOSIS — M810 Age-related osteoporosis without current pathological fracture: Secondary | ICD-10-CM | POA: Diagnosis not present

## 2021-03-02 DIAGNOSIS — J4540 Moderate persistent asthma, uncomplicated: Secondary | ICD-10-CM | POA: Diagnosis not present

## 2021-03-02 DIAGNOSIS — J309 Allergic rhinitis, unspecified: Secondary | ICD-10-CM | POA: Diagnosis not present

## 2021-03-02 DIAGNOSIS — I2510 Atherosclerotic heart disease of native coronary artery without angina pectoris: Secondary | ICD-10-CM | POA: Diagnosis not present

## 2021-04-28 ENCOUNTER — Other Ambulatory Visit: Payer: Self-pay | Admitting: Family Medicine

## 2021-04-28 DIAGNOSIS — J329 Chronic sinusitis, unspecified: Secondary | ICD-10-CM | POA: Diagnosis not present

## 2021-05-02 ENCOUNTER — Ambulatory Visit (INDEPENDENT_AMBULATORY_CARE_PROVIDER_SITE_OTHER): Payer: Medicare Other | Admitting: Otolaryngology

## 2021-05-03 ENCOUNTER — Other Ambulatory Visit: Payer: Self-pay | Admitting: Family Medicine

## 2021-05-03 DIAGNOSIS — J329 Chronic sinusitis, unspecified: Secondary | ICD-10-CM

## 2021-05-18 ENCOUNTER — Other Ambulatory Visit: Payer: Medicare Other

## 2021-05-20 ENCOUNTER — Ambulatory Visit
Admission: RE | Admit: 2021-05-20 | Discharge: 2021-05-20 | Disposition: A | Discharge status: Home / Self Care | Payer: Medicare Other | Source: Ambulatory Visit | Attending: Family Medicine | Admitting: Family Medicine

## 2021-05-20 ENCOUNTER — Other Ambulatory Visit: Payer: Self-pay

## 2021-05-20 DIAGNOSIS — J321 Chronic frontal sinusitis: Secondary | ICD-10-CM | POA: Diagnosis not present

## 2021-05-20 DIAGNOSIS — J329 Chronic sinusitis, unspecified: Secondary | ICD-10-CM

## 2021-05-20 DIAGNOSIS — J323 Chronic sphenoidal sinusitis: Secondary | ICD-10-CM | POA: Diagnosis not present

## 2021-05-20 DIAGNOSIS — J342 Deviated nasal septum: Secondary | ICD-10-CM | POA: Diagnosis not present

## 2021-06-15 DIAGNOSIS — J320 Chronic maxillary sinusitis: Secondary | ICD-10-CM | POA: Diagnosis not present

## 2021-06-20 ENCOUNTER — Inpatient Hospital Stay (HOSPITAL_COMMUNITY)
Admission: EM | Admit: 2021-06-20 | Discharge: 2021-06-24 | DRG: 378 | Disposition: A | Discharge status: Home / Self Care | Payer: Medicare Other | Attending: Family Medicine | Admitting: Family Medicine

## 2021-06-20 ENCOUNTER — Encounter (HOSPITAL_COMMUNITY): Payer: Self-pay

## 2021-06-20 ENCOUNTER — Other Ambulatory Visit: Payer: Self-pay

## 2021-06-20 ENCOUNTER — Emergency Department (HOSPITAL_COMMUNITY): Payer: Medicare Other

## 2021-06-20 DIAGNOSIS — K250 Acute gastric ulcer with hemorrhage: Principal | ICD-10-CM | POA: Diagnosis present

## 2021-06-20 DIAGNOSIS — Z9013 Acquired absence of bilateral breasts and nipples: Secondary | ICD-10-CM | POA: Diagnosis not present

## 2021-06-20 DIAGNOSIS — Z955 Presence of coronary angioplasty implant and graft: Secondary | ICD-10-CM

## 2021-06-20 DIAGNOSIS — I10 Essential (primary) hypertension: Secondary | ICD-10-CM | POA: Diagnosis present

## 2021-06-20 DIAGNOSIS — K254 Chronic or unspecified gastric ulcer with hemorrhage: Secondary | ICD-10-CM | POA: Diagnosis not present

## 2021-06-20 DIAGNOSIS — Z20822 Contact with and (suspected) exposure to covid-19: Secondary | ICD-10-CM | POA: Diagnosis present

## 2021-06-20 DIAGNOSIS — I2510 Atherosclerotic heart disease of native coronary artery without angina pectoris: Secondary | ICD-10-CM | POA: Diagnosis not present

## 2021-06-20 DIAGNOSIS — K449 Diaphragmatic hernia without obstruction or gangrene: Secondary | ICD-10-CM | POA: Diagnosis present

## 2021-06-20 DIAGNOSIS — I700 Atherosclerosis of aorta: Secondary | ICD-10-CM | POA: Diagnosis not present

## 2021-06-20 DIAGNOSIS — Z79899 Other long term (current) drug therapy: Secondary | ICD-10-CM

## 2021-06-20 DIAGNOSIS — R195 Other fecal abnormalities: Secondary | ICD-10-CM | POA: Diagnosis not present

## 2021-06-20 DIAGNOSIS — Z7982 Long term (current) use of aspirin: Secondary | ICD-10-CM

## 2021-06-20 DIAGNOSIS — Z885 Allergy status to narcotic agent status: Secondary | ICD-10-CM

## 2021-06-20 DIAGNOSIS — K625 Hemorrhage of anus and rectum: Secondary | ICD-10-CM

## 2021-06-20 DIAGNOSIS — J45909 Unspecified asthma, uncomplicated: Secondary | ICD-10-CM | POA: Diagnosis present

## 2021-06-20 DIAGNOSIS — K529 Noninfective gastroenteritis and colitis, unspecified: Secondary | ICD-10-CM | POA: Diagnosis present

## 2021-06-20 DIAGNOSIS — I252 Old myocardial infarction: Secondary | ICD-10-CM | POA: Diagnosis not present

## 2021-06-20 DIAGNOSIS — K5730 Diverticulosis of large intestine without perforation or abscess without bleeding: Secondary | ICD-10-CM | POA: Diagnosis not present

## 2021-06-20 DIAGNOSIS — D649 Anemia, unspecified: Secondary | ICD-10-CM

## 2021-06-20 DIAGNOSIS — K922 Gastrointestinal hemorrhage, unspecified: Secondary | ICD-10-CM

## 2021-06-20 DIAGNOSIS — Z825 Family history of asthma and other chronic lower respiratory diseases: Secondary | ICD-10-CM | POA: Diagnosis not present

## 2021-06-20 DIAGNOSIS — D62 Acute posthemorrhagic anemia: Secondary | ICD-10-CM | POA: Diagnosis not present

## 2021-06-20 DIAGNOSIS — E785 Hyperlipidemia, unspecified: Secondary | ICD-10-CM | POA: Diagnosis present

## 2021-06-20 DIAGNOSIS — K317 Polyp of stomach and duodenum: Secondary | ICD-10-CM | POA: Diagnosis present

## 2021-06-20 DIAGNOSIS — Z803 Family history of malignant neoplasm of breast: Secondary | ICD-10-CM | POA: Diagnosis not present

## 2021-06-20 DIAGNOSIS — Z8249 Family history of ischemic heart disease and other diseases of the circulatory system: Secondary | ICD-10-CM | POA: Diagnosis not present

## 2021-06-20 DIAGNOSIS — Z853 Personal history of malignant neoplasm of breast: Secondary | ICD-10-CM | POA: Diagnosis not present

## 2021-06-20 DIAGNOSIS — K921 Melena: Secondary | ICD-10-CM | POA: Diagnosis not present

## 2021-06-20 DIAGNOSIS — R55 Syncope and collapse: Secondary | ICD-10-CM | POA: Diagnosis not present

## 2021-06-20 DIAGNOSIS — E876 Hypokalemia: Secondary | ICD-10-CM | POA: Diagnosis present

## 2021-06-20 DIAGNOSIS — Z881 Allergy status to other antibiotic agents status: Secondary | ICD-10-CM

## 2021-06-20 DIAGNOSIS — Z888 Allergy status to other drugs, medicaments and biological substances status: Secondary | ICD-10-CM

## 2021-06-20 DIAGNOSIS — Z87891 Personal history of nicotine dependence: Secondary | ICD-10-CM

## 2021-06-20 DIAGNOSIS — K259 Gastric ulcer, unspecified as acute or chronic, without hemorrhage or perforation: Secondary | ICD-10-CM | POA: Diagnosis not present

## 2021-06-20 DIAGNOSIS — F419 Anxiety disorder, unspecified: Secondary | ICD-10-CM | POA: Diagnosis present

## 2021-06-20 HISTORY — DX: Irritable bowel syndrome, unspecified: K58.9

## 2021-06-20 LAB — POC OCCULT BLOOD, ED: Fecal Occult Bld: POSITIVE — AB

## 2021-06-20 LAB — RESP PANEL BY RT-PCR (FLU A&B, COVID) ARPGX2
Influenza A by PCR: NEGATIVE
Influenza B by PCR: NEGATIVE
SARS Coronavirus 2 by RT PCR: NEGATIVE

## 2021-06-20 LAB — URINALYSIS, ROUTINE W REFLEX MICROSCOPIC
Bilirubin Urine: NEGATIVE
Glucose, UA: NEGATIVE mg/dL
Hgb urine dipstick: NEGATIVE
Ketones, ur: NEGATIVE mg/dL
Leukocytes,Ua: NEGATIVE
Nitrite: NEGATIVE
Protein, ur: NEGATIVE mg/dL
Specific Gravity, Urine: 1.014 (ref 1.005–1.030)
pH: 7 (ref 5.0–8.0)

## 2021-06-20 LAB — COMPREHENSIVE METABOLIC PANEL
ALT: 19 U/L (ref 0–44)
AST: 24 U/L (ref 15–41)
Albumin: 3.9 g/dL (ref 3.5–5.0)
Alkaline Phosphatase: 60 U/L (ref 38–126)
Anion gap: 11 (ref 5–15)
BUN: 39 mg/dL — ABNORMAL HIGH (ref 8–23)
CO2: 29 mmol/L (ref 22–32)
Calcium: 8.7 mg/dL — ABNORMAL LOW (ref 8.9–10.3)
Chloride: 98 mmol/L (ref 98–111)
Creatinine, Ser: 0.96 mg/dL (ref 0.44–1.00)
GFR, Estimated: >60 mL/min (ref >60)
Glucose, Bld: 164 mg/dL — ABNORMAL HIGH (ref 70–99)
Potassium: 3.3 mmol/L — ABNORMAL LOW (ref 3.5–5.1)
Sodium: 138 mmol/L (ref 135–145)
Total Bilirubin: 0.6 mg/dL (ref 0.3–1.2)
Total Protein: 6.6 g/dL (ref 6.5–8.1)

## 2021-06-20 LAB — CBC
HCT: 32.3 % — ABNORMAL LOW (ref 36.0–46.0)
Hemoglobin: 10.4 g/dL — ABNORMAL LOW (ref 12.0–15.0)
MCH: 32.1 pg (ref 26.0–34.0)
MCHC: 32.2 g/dL (ref 30.0–36.0)
MCV: 99.7 fL (ref 80.0–100.0)
Platelets: 250 10*3/uL (ref 150–400)
RBC: 3.24 MIL/uL — ABNORMAL LOW (ref 3.87–5.11)
RDW: 12.8 % (ref 11.5–15.5)
WBC: 12 10*3/uL — ABNORMAL HIGH (ref 4.0–10.5)
nRBC: 0 % (ref 0.0–0.2)

## 2021-06-20 LAB — PROTIME-INR
INR: 1 (ref 0.8–1.2)
Prothrombin Time: 13.5 seconds (ref 11.4–15.2)

## 2021-06-20 LAB — LIPASE, BLOOD: Lipase: 26 U/L (ref 11–51)

## 2021-06-20 MED ORDER — FAMOTIDINE IN NACL 20-0.9 MG/50ML-% IV SOLN
20.0000 mg | Freq: Once | INTRAVENOUS | Status: AC
  Administered 2021-06-20: 20 mg via INTRAVENOUS
  Filled 2021-06-20: qty 50

## 2021-06-20 MED ORDER — IOHEXOL 350 MG/ML SOLN
80.0000 mL | Freq: Once | INTRAVENOUS | Status: AC | PRN
  Administered 2021-06-20: 80 mL via INTRAVENOUS

## 2021-06-20 MED ORDER — ONDANSETRON HCL 4 MG/2ML IJ SOLN
4.0000 mg | Freq: Once | INTRAMUSCULAR | Status: AC
  Administered 2021-06-20: 4 mg via INTRAVENOUS
  Filled 2021-06-20: qty 2

## 2021-06-20 MED ORDER — ACETAMINOPHEN 325 MG PO TABS
650.0000 mg | ORAL_TABLET | Freq: Once | ORAL | Status: AC
  Administered 2021-06-20: 650 mg via ORAL
  Filled 2021-06-20: qty 2

## 2021-06-20 MED ORDER — LACTATED RINGERS IV BOLUS
500.0000 mL | Freq: Once | INTRAVENOUS | Status: AC
  Administered 2021-06-20: 500 mL via INTRAVENOUS

## 2021-06-20 NOTE — ED Provider Notes (Signed)
[place] DEPT Provider Note   CSN: [id number] Arrival date & time: 06/20/21  1426     History Chief Complaint  Patient presents with   Emesis   Abdominal Pain   Diarrhea    [name] [name] is a 75 y.o. female.  HPI Patient is a 75 year old female with past medical history significant for breast cancer s/p bilateral mastectomy, CAD and MI with stent, diverticulosis, HTN, colon polyp'  Patient states that she was started for 5 days ago on clindamycin for a sinus infection that she states is severe.  She states that she has an ear nose and throat doctor who she follows with for her sinus issues.  She states that she did notice she was having more frequent episodes of "normal "stool over the past few days but last night she started having frequent episodes of dark, jet black diarrhea that is watery.  She states she had numerous episodes throughout the night.  She denies any fevers.  No recent travel.  Clindamycin is the only new antibiotic she has been taking. She endorses persistent nausea but has only had 1 episode of emesis that was this morning but was black. No other emesis.   Patient is presenting to the emergency department today with complaints of mid abdominal pain/epigastric abdominal pain that began  She denies any iron supplements, beets, red dye intake that she knows.      Past Medical History:  Diagnosis Date   Allergic rhinitis    Anxiety disorder    Asthma    Breast cancer ([place])    bilateral   CAD (coronary artery disease)    a. NSTEMI 8/04 => LHC 07/2003:  mLAD hazy 75% => Taxus DES, EF 30% with ant-apical, apical and inf-apical AK.  b.  Echo 4/05: EF 60%.  c.  ETT/Lexiscan Myoview 06/2011: EF 80%, no ischemia, normal wall motion   Diverticulosis 11/13/2004   Hyperplastic colon polyp 10/16/1999   Hypertension    IBS (irritable bowel syndrome)    Internal and external hemorrhoids without complication 40/09/2724   MI (myocardial  infarction) ([place]) 12/03/2002   Anteroapical MI with PCI to the LAD   Positive PPD    PVC (premature ventricular contraction)     Patient Active Problem List   Diagnosis Date Noted   GI bleed 06/20/2021   Hypokalemia 06/20/2021   Animal bite of left lower leg 03/02/2014   Hyperlipidemia 07/18/2011   Essential hypertension 04/04/2010   CONSTIPATION 12/05/2009   EXTERNAL HEMORRHOIDS 11/28/2009   Diverticulosis of colon 11/28/2009   COLONIC POLYPS, HYPERPLASTIC, HX OF 11/28/2009   CAD (coronary artery disease) 12/11/2007   RHINOSINUSITIS, RECURRENT 12/11/2007   ALLERGIC RHINITIS 12/11/2007   Asthma 12/11/2007    Past Surgical History:  Procedure Laterality Date   APPENDECTOMY     bilateral mastectomy     CARDIOVASCULAR STRESS TEST  07/01/2006   ef 80%   CORONARY ANGIOPLASTY WITH STENT PLACEMENT  2004   LAD   US ECHOCARDIOGRAPHY  03/27/2004   resting ef 60%     OB History   No obstetric history on file.     Family History  Problem Relation Age of Onset   Breast cancer Mother    Breast cancer Sister    Heart disease Maternal Grandmother    Asthma Daughter    Spina bifida Unknown    Colon cancer Neg Hx     Social History   Tobacco Use   Smoking status: Former  Types: Cigarettes    Quit date: 08/07/1966    Years since quitting: 54.9   Smokeless tobacco: Never  Vaping Use   Vaping Use: Never used  Substance Use Topics   Alcohol use: Yes    Alcohol/week: 0.0 standard drinks    Comment: once every other month   Drug use: No    Home Medications Prior to Admission medications   Medication Sig Start Date End Date Taking? Authorizing Provider  acetaminophen (TYLENOL) 325 MG tablet Take 650 mg by mouth every 6 (six) hours as needed for mild pain.     [provider]  albuterol (PROAIR HFA) 108 (90 BASE) MCG/ACT inhaler Inhale 2 puffs into the lungs every 6 (six) hours as needed for shortness of breath.     [provider]  aspirin 81 MG tablet  Take 81 mg by mouth daily.     [provider]  calcium carbonate (OS-CAL) 600 MG TABS Take 600 mg by mouth daily. 1Tablet Daily    [provider]  denosumab (PROLIA) 60 MG/ML SOSY injection Inject 60 mg into the skin every 6 (six) months.    [provider]  escitalopram (LEXAPRO) 10 MG tablet Take 10 mg by mouth daily.    [provider]  fluticasone (FLONASE) 50 MCG/ACT nasal spray Place 1 spray into both nostrils daily as needed for allergies.  01/08/14   [provider]  hydrochlorothiazide (HYDRODIURIL) 25 MG tablet Take 1 tablet (25 mg total) by mouth daily. 02/13/16   Nahser, Wonda Cheng, MD  irbesartan (AVAPRO) 150 MG tablet Take 150 mg by mouth daily. 08/07/17   [provider]  levocetirizine (XYZAL) 5 MG tablet Take 5 mg by mouth every evening.    [provider]  metroNIDAZOLE (METROGEL) 0.75 % gel Apply 1 application topically at bedtime. 07/04/20   [provider]  nebivolol (BYSTOLIC) 2.5 MG tablet Take 1 tablet (2.5 mg total) by mouth daily. 09/05/20   Nahser, Wonda Cheng, MD  nitroGLYCERIN (NITROSTAT) 0.4 MG SL tablet Place 0.4 mg under the tongue as needed.    [provider]  Potassium Chloride ER 20 MEQ TBCR Take 20 mEq by mouth 2 (two) times daily. 01/13/15   Nahser, Wonda Cheng, MD  potassium chloride SA (KLOR-CON) 20 MEQ tablet Take 20 mEq by mouth 2 (two) times daily. 07/23/20   [provider]  PREMARIN vaginal cream Place 0.5 Applicatorfuls vaginally 2 (two) times a week. On Tuesday and Saturday     Use as directed 05/03/11   [provider]  rosuvastatin (CRESTOR) 5 MG tablet Take 1 tablet (5 mg total) by mouth daily. 01/13/13   Nahser, Wonda Cheng, MD  sulfamethoxazole-trimethoprim (BACTRIM) 400-80 MG tablet Take 1 tablet by mouth daily. 07/06/20   [provider]  trimethoprim (TRIMPEX) 100 MG tablet Take 100 mg by mouth daily. 03/27/20   [provider]    Allergies     Codeine, Lisinopril, and Simvastatin  Review of Systems   Review of Systems  Constitutional:  Positive for fatigue. Negative for chills and fever.  HENT:  Negative for congestion.   Eyes:  Negative for pain.  Respiratory:  Negative for cough and shortness of breath.   Cardiovascular:  Negative for chest pain and leg swelling.  Gastrointestinal:  Positive for abdominal pain, diarrhea, nausea and vomiting.       Black stool  Genitourinary:  Negative for dysuria.  Musculoskeletal:  Negative for myalgias.  Skin:  Negative for rash.  Neurological:  Negative for dizziness and headaches.   Physical Exam Updated Vital Signs BP (!) 121/58   Pulse 62   Temp 98.4 F (36.9 C) (Oral)   Resp 14   Ht 5\' 1"  (1.549 m)   Wt 59.9 kg   SpO2 96%   BMI 24.94 kg/m   Physical Exam Vitals and nursing note reviewed.  Constitutional:      General: She is not in acute distress.    Comments: Pleasant 75 year old female in no acute distress.  Comfortably in bed.  Pleasant, able answer questions appropriate follow commands.  HENT:     Head: Normocephalic and atraumatic.     Nose: Nose normal.  Eyes:     General: No scleral icterus. Cardiovascular:     Rate and Rhythm: Normal rate and regular rhythm.     Pulses: Normal pulses.     Heart sounds: Normal heart sounds.  Pulmonary:     Effort: Pulmonary effort is normal. No respiratory distress.     Breath sounds: No wheezing.  Abdominal:     Palpations: Abdomen is soft.     Tenderness: There is abdominal tenderness.     Comments: Some epigastric tenderness palpation.  No lower or mid abdominal tenderness.  Musculoskeletal:     Cervical back: Normal range of motion.     Right lower leg: No edema.     Left lower leg: No edema.  Skin:    General: Skin is warm and dry.     Capillary Refill: Capillary refill takes less than 2 seconds.  Neurological:     Mental Status: She is alert. Mental status is at baseline.  Psychiatric:        Mood and  Affect: Mood normal.        Behavior: Behavior normal.    ED Results / Procedures / Treatments   Labs (all labs ordered are listed, but only abnormal results are displayed) Labs Reviewed  COMPREHENSIVE METABOLIC PANEL - Abnormal; Notable for the following components:      Result Value   Potassium 3.3 (*)    Glucose, Bld 164 (*)    BUN 39 (*)    Calcium 8.7 (*)    All other components within normal limits  CBC - Abnormal; Notable for the following components:   WBC 12.0 (*)    RBC 3.24 (*)    Hemoglobin 10.4 (*)    HCT 32.3 (*)    All other components within normal limits  URINALYSIS, ROUTINE W REFLEX MICROSCOPIC - Abnormal; Notable for the following components:   Color, Urine STRAW (*)    All other components within normal limits  POC OCCULT BLOOD, ED - Abnormal; Notable for the following components:   Fecal Occult Bld POSITIVE (*)    All other components within normal limits  RESP PANEL BY RT-PCR (FLU A&B, COVID) ARPGX2  C DIFFICILE QUICK SCREEN W PCR REFLEX    LIPASE, BLOOD  PROTIME-INR  TYPE AND SCREEN  ABO/RH    EKG None  Radiology CT ABDOMEN PELVIS W CONTRAST  Result Date: 06/20/2021 CLINICAL DATA:  GI bleed. Epigastric pain. Concern for C diff colitis given recent antibiotics versus less likely a stomach ulcer. EXAM: CT ABDOMEN AND PELVIS WITH CONTRAST TECHNIQUE: Multidetector CT imaging of the abdomen and pelvis was performed using the standard protocol following bolus administration of intravenous contrast. CONTRAST:  73mL OMNIPAQUE IOHEXOL 350 MG/ML SOLN COMPARISON:  05/17/2014 FINDINGS: Lower chest: Linear atelectasis or scarring in the right lower lobe. 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 No confluent airspace disease or pleural effusion. Hepatobiliary: Scattered low-density lesions throughout the liver are all subcentimeter, similar to prior exam and likely cysts. No suspicious liver lesion. Gallbladder physiologically distended, no calcified  stone. No biliary dilatation. Pancreas: No ductal dilatation or inflammation. Spleen: Normal in size without focal abnormality. Adrenals/Urinary Tract: No adrenal nodule. No hydronephrosis. There are bilateral parapelvic cysts. 6 mm nonobstructing stone in the lower left kidney there is homogeneous renal enhancement with symmetric excretion on delayed phase imaging. Partially distended and unremarkable urinary bladder. Stomach/Bowel: Decompressed stomach. Fall there is no small bowel obstruction or inflammation. No small bowel wall thickening. Occasional fecalization of distal small bowel contents. Appendix not visualized, reported history of appendectomy. There is multifocal colonic diverticulosis. Equivocal minor wall thickening of the sigmoid colon, for example series 2, image 50, but no focal diverticulitis or pericolonic inflammation. No liquid stool in the colon. Small volume of formed stool. Vascular/Lymphatic: Aortic atherosclerosis. No aortic aneurysm. Patent portal and mesenteric veins. No portal venous or mesenteric gas. No abdominopelvic adenopathy. Reproductive: Uterus is not visualized, presumably surgically absent. No adnexal mass Other: No ascites. No free air. No inflammatory change in the abdomen or pelvis. Abdominal wall hernia. Musculoskeletal: There are no acute or suspicious osseous abnormalities. Chronic appearing anterior wedging of L1 vertebral body. L5-S1 degenerative disc disease. IMPRESSION: 1. Equivocal minor wall thickening of the sigmoid colon in the region of multiple colonic diverticula. This may represent minimal colitis, but no focally inflamed diverticulum to suggest diverticulitis. No CT findings to suggest C diff colitis. 2. No other acute abnormality in the abdomen/pelvis. 3. Nonobstructing left renal stone. Bilateral parapelvic cysts. Aortic Atherosclerosis (ICD10-I70.0). Electronically Signed   By: Keith Rake M.D.   On: 06/20/2021 18:09    Procedures Procedures    Medications Ordered in ED Medications  famotidine (PEPCID) IVPB 20 mg premix (0 mg Intravenous Stopped 06/20/21 1743)  ondansetron (ZOFRAN) injection 4 mg (4 mg Intravenous Given 06/20/21 1707)  lactated ringers bolus 500 mL (0 mLs Intravenous Stopped 06/20/21 1901)  iohexol (OMNIPAQUE) 350 MG/ML injection 80 mL (80 mLs Intravenous Contrast Given 06/20/21 1725)  acetaminophen (TYLENOL) tablet 650 mg (650 mg Oral Given 06/20/21 2046)    ED Course  I have reviewed the triage vital signs and the nursing notes.  Pertinent labs & imaging results that were available during my care of the patient were reviewed by me and considered in my medical decision making (see chart for details).    MDM Rules/Calculators/A&P                          Patient is a 75 year old female with past medical history detailed in HPI presented today for black stool found to be Hemoccult positive hemoglobin has decreased from the last lab that I have which is from 7 years ago I reviewed on patient's patient portal and I do not see any more recent CBC.  CMP notable for mild elevation of BUN concerning for upper GI bleed she is having some epigastric abdominal pain concerning for GI ulcer/duodenal/stomach ulcer.  Glucose mildly elevated.  Potassium mildly low no other significant abnormalities on CMP.  CBC with mild leukocytosis anemia as discussed above.  Urinalysis unremarkable type and screen and INR obtained.  COVID influenza negative.  Given Tylenol, 500 mL of LR, Pepcid given the potential upper GI bleed source.  And Zofran.  I do have some mild concern that  patient may have C. difficile given that she has had multiple episodes of diarrhea and started on clindamycin  Discussed my attending physician who recommended admission given her age, anemia, active GI bleed.  CT scan with some minor wall thickening of the sigmoid colon not specifically concerning for C. difficile.  Discussed with [facility] Dr. Tarri Glenn who included  additional [facility] attendings in the discussion.  Dr. Mcarthur Rossetti aware of patient and will admit to medicine.  Patient agreeable to plan.  All questions answered best my ability.  Final Clinical Impression(s) / ED Diagnoses Final diagnoses:  Gastrointestinal hemorrhage, unspecified gastrointestinal hemorrhage type    Rx / DC Orders ED Discharge Orders     None        Tedd Sias, [place] 06/21/21 0001    Lacretia Leigh, MD 06/21/21 1534

## 2021-06-20 NOTE — ED Provider Notes (Signed)
Emergency Medicine Provider Triage Evaluation Note  [name] [name] , a 74 y.o. female  was evaluated in triage.  Pt complains of diarrhea and emesis that started early this morning.  Patient admits to approximately 10 episodes of dark tarry diarrhea.  She also endorses 1 episode of emesis that had black specks in it.  She is not currently on any blood thinners.  She was recently placed on clindamycin last Thursday for a sinus infection.  Patient admits to malodorous diarrhea.  Diarrhea and emesis associated with diffuse abdominal pain.  No fever or chills.  Admits to intermittent shortness of breath.  Denies chest pain. No history of GI bleed.  Review of Systems  Positive: Diarrhea, vomiting Negative: fever  Physical Exam  BP 136/66 (BP Location: Left Arm)   Pulse 76   Temp 98.4 F (36.9 C) (Oral)   Resp 14   Ht 5\' 1"  (1.549 m)   Wt 59.9 kg   SpO2 100%   BMI 24.94 kg/m  Gen:   Awake, no distress   Resp:  Normal effort  MSK:   Moves extremities without difficulty  Other:    Medical Decision Making  Medically screening exam initiated at 3:24 PM.  Appropriate orders placed.  Lyle Leisner Quickel was informed that the remainder of the evaluation will be completed by another provider, this initial triage assessment does not replace that evaluation, and the importance of remaining in the ED until their evaluation is complete.  Labs ordered   Karie Kirks 06/20/21 1527    Isla Pence, MD 06/20/21 [phone]

## 2021-06-20 NOTE — ED Triage Notes (Signed)
Patient c/o mid abdominal pain and states she had approx 10 diarrheal stool since last night that were black in color. Patient also reports that she vomited x 1 today and emesis was black in color as well.

## 2021-06-20 NOTE — H&P (Signed)
History and Physical   [name] [name] PPJ:[id number] DOB: 04-27-1946 DOA: 06/20/2021  Referring MD/NP/PA: Dr. Vivi Martens  PCP: Cari Caraway, MD   Outpatient Specialists: [facility]  Patient coming from: Home  Chief Complaint: Rectal bleed and abdominal pain  HPI: [name] [name] is a 75 y.o. female with medical history significant of diverticular disease, coronary artery disease, anxiety disorder, asthma, breast cancer, essential hypertension, IBS who presented to the ER with sudden onset of diarrhea and emesis this morning.  Associated with dark tarry stools.  Patient reported up to 10 episodes of stool today.  There is also one episode of diarrhea.  She was on clindamycin last week for sinus infection.  C. difficile at this point is pending.  No fever no chills.  She did have some abdominal pain but no tenderness.  CT abdomen and did not show any obvious colitis at this point.  GI was consulted and patient is being admitted with GI bleed possibly upper GI bleed.  Denied any NSAIDs use but she has been taking Tylenol..  ED Course: Temperature is 98.4 blood pressure 140/84 pulse 79 respirate of 16 and oxygen sat 96% on room air.  COVID-19 is negative urinalysis negative fecal occult blood testing positive x2.  Chemistry showed potassium 3.3 glucose 164 otherwise within normal white count 12.1 hemoglobin 10.4.  CT abdomen pelvis shows equivocal mild wall thickening of the sigmoid colon.  Possibly minimal colitis.  Patient is being admitted with GI bleed possible colitis.  Review of Systems: As per HPI otherwise 10 point review of systems negative.    Past Medical History:  Diagnosis Date   Allergic rhinitis    Anxiety disorder    Asthma    Breast cancer ([place])    bilateral   CAD (coronary artery disease)    a. NSTEMI 8/04 => LHC 07/2003:  mLAD hazy 75% => Taxus DES, EF 30% with ant-apical, apical and inf-apical AK.  b.  Echo 4/05: EF 60%.  c.  ETT/Lexiscan Myoview 06/2011: EF  80%, no ischemia, normal wall motion   Diverticulosis 11/13/2004   Hyperplastic colon polyp 10/16/1999   Hypertension    IBS (irritable bowel syndrome)    Internal and external hemorrhoids without complication 58/08/9832   MI (myocardial infarction) ([place]) 12/03/2002   Anteroapical MI with PCI to the LAD   Positive PPD    PVC (premature ventricular contraction)     Past Surgical History:  Procedure Laterality Date   APPENDECTOMY     bilateral mastectomy     CARDIOVASCULAR STRESS TEST  07/01/2006   ef 80%   CORONARY ANGIOPLASTY WITH STENT PLACEMENT  2004   LAD   US ECHOCARDIOGRAPHY  03/27/2004   resting ef 60%     reports that she quit smoking about 54 years ago. Her smoking use included cigarettes. She has never used smokeless tobacco. She reports current alcohol use. She reports that she does not use drugs.  Allergies  Allergen Reactions   Codeine Other (See Comments)    headache   Lisinopril Cough   Simvastatin Diarrhea    Family History  Problem Relation Age of Onset   Breast cancer Mother    Breast cancer Sister    Heart disease Maternal Grandmother    Asthma Daughter    Spina bifida Unknown    Colon cancer Neg Hx      Prior to Admission medications   Medication Sig Start Date End Date Taking? Authorizing Provider  acetaminophen (TYLENOL) 325 MG tablet  Take 650 mg by mouth every 6 (six) hours as needed for mild pain.     [provider]  albuterol (PROAIR HFA) 108 (90 BASE) MCG/ACT inhaler Inhale 2 puffs into the lungs every 6 (six) hours as needed for shortness of breath.     [provider]  aspirin 81 MG tablet Take 81 mg by mouth daily.     [provider]  calcium carbonate (OS-CAL) 600 MG TABS Take 600 mg by mouth daily. 1Tablet Daily    [provider]  denosumab (PROLIA) 60 MG/ML SOSY injection Inject 60 mg into the skin every 6 (six) months.    [provider]  escitalopram (LEXAPRO) 10 MG tablet Take 10 mg by  mouth daily.    [provider]  fluticasone (FLONASE) 50 MCG/ACT nasal spray Place 1 spray into both nostrils daily as needed for allergies.  01/08/14   [provider]  hydrochlorothiazide (HYDRODIURIL) 25 MG tablet Take 1 tablet (25 mg total) by mouth daily. 02/13/16   Nahser, Wonda Cheng, MD  irbesartan (AVAPRO) 150 MG tablet Take 150 mg by mouth daily. 08/07/17   [provider]  levocetirizine (XYZAL) 5 MG tablet Take 5 mg by mouth every evening.    [provider]  metroNIDAZOLE (METROGEL) 0.75 % gel Apply 1 application topically at bedtime. 07/04/20   [provider]  nebivolol (BYSTOLIC) 2.5 MG tablet Take 1 tablet (2.5 mg total) by mouth daily. 09/05/20   Nahser, Wonda Cheng, MD  nitroGLYCERIN (NITROSTAT) 0.4 MG SL tablet Place 0.4 mg under the tongue as needed.    [provider]  Potassium Chloride ER 20 MEQ TBCR Take 20 mEq by mouth 2 (two) times daily. 01/13/15   Nahser, Wonda Cheng, MD  potassium chloride SA (KLOR-CON) 20 MEQ tablet Take 20 mEq by mouth 2 (two) times daily. 07/23/20   [provider]  PREMARIN vaginal cream Place 0.5 Applicatorfuls vaginally 2 (two) times a week. On Tuesday and Saturday     Use as directed 05/03/11   [provider]  rosuvastatin (CRESTOR) 5 MG tablet Take 1 tablet (5 mg total) by mouth daily. 01/13/13   Nahser, Wonda Cheng, MD  sulfamethoxazole-trimethoprim (BACTRIM) 400-80 MG tablet Take 1 tablet by mouth daily. 07/06/20   [provider]  trimethoprim (TRIMPEX) 100 MG tablet Take 100 mg by mouth daily. 03/27/20   [provider]    Physical Exam: Vitals:   06/20/21 1933 06/20/21 2000 06/20/21 2015 06/20/21 2145  BP: 140/68 100/80 117/61 117/61  Pulse: 71 68 70 64  Resp: 15     Temp:      TempSrc:      SpO2: 99% 98% 98% 97%  Weight:      Height:          Constitutional: Acutely ill looking, frail, no distress v Vitals:   06/20/21 1933 06/20/21 2000 06/20/21 2015 06/20/21  2145  BP: 140/68 100/80 117/61 117/61  Pulse: 71 68 70 64  Resp: 15     Temp:      TempSrc:      SpO2: 99% 98% 98% 97%  Weight:      Height:       Eyes: PERRL, lids and conjunctivae normal ENMT: Mucous membranes are dry posterior pharynx clear of any exudate or lesions.Normal dentition.  Neck: normal, supple, no masses, no thyromegaly Respiratory: clear to auscultation bilaterally, no wheezing, no crackles. Normal respiratory effort. No accessory muscle use.  Cardiovascular: Regular rate  and rhythm, no murmurs / rubs / gallops. No extremity edema. 2+ pedal pulses. No carotid bruits.  Abdomen: Mild epigastric tenderness, no masses palpated. No hepatosplenomegaly. Bowel sounds positive.  Musculoskeletal: no clubbing / cyanosis. No joint deformity upper and lower extremities. Good ROM, no contractures. Normal muscle tone.  Skin: no rashes, lesions, ulcers. No induration Neurologic: CN 2-12 grossly intact. Sensation intact, DTR normal. Strength 5/5 in all 4.  Psychiatric: Normal judgment and insight. Alert and oriented x 3. Normal mood.     Labs on Admission: I have personally reviewed following labs and imaging studies  CBC: Recent Labs  Lab 06/20/21 1601  WBC 12.0*  HGB 10.4*  HCT 32.3*  MCV 99.7  PLT 027   Basic Metabolic Panel: Recent Labs  Lab 06/20/21 1601  NA 138  K 3.3*  CL 98  CO2 29  GLUCOSE 164*  BUN 39*  CREATININE 0.96  CALCIUM 8.7*   GFR: Estimated Creatinine Clearance: 41.4 mL/min (by C-G formula based on SCr of 0.96 mg/dL). Liver Function Tests: Recent Labs  Lab 06/20/21 1601  AST 24  ALT 19  ALKPHOS 60  BILITOT 0.6  PROT 6.6  ALBUMIN 3.9   Recent Labs  Lab 06/20/21 1601  LIPASE 26   No results for input(s): AMMONIA in the last 168 hours. Coagulation Profile: Recent Labs  Lab 06/20/21 1603  INR 1.0   Cardiac Enzymes: No results for input(s): CKTOTAL, CKMB, CKMBINDEX, TROPONINI in the last 168 hours. BNP (last 3 results) No  results for input(s): PROBNP in the last 8760 hours. HbA1C: No results for input(s): HGBA1C in the last 72 hours. CBG: No results for input(s): GLUCAP in the last 168 hours. Lipid Profile: No results for input(s): CHOL, HDL, LDLCALC, TRIG, CHOLHDL, LDLDIRECT in the last 72 hours. Thyroid Function Tests: No results for input(s): TSH, T4TOTAL, FREET4, T3FREE, THYROIDAB in the last 72 hours. Anemia Panel: No results for input(s): VITAMINB12, FOLATE, FERRITIN, TIBC, IRON, RETICCTPCT in the last 72 hours. Urine analysis:    Component Value Date/Time   COLORURINE STRAW (A) 06/20/2021 1647   APPEARANCEUR CLEAR 06/20/2021 1647   LABSPEC 1.014 06/20/2021 1647   PHURINE 7.0 06/20/2021 1647   GLUCOSEU NEGATIVE 06/20/2021 1647   HGBUR NEGATIVE 06/20/2021 1647   BILIRUBINUR NEGATIVE 06/20/2021 1647   KETONESUR NEGATIVE 06/20/2021 1647   PROTEINUR NEGATIVE 06/20/2021 1647   NITRITE NEGATIVE 06/20/2021 1647   LEUKOCYTESUR NEGATIVE 06/20/2021 1647   Sepsis Labs: @LABRCNTIP (procalcitonin:4,lacticidven:4) ) Recent Results (from the past 240 hour(s))  Resp Panel by RT-PCR (Flu A&B, Covid) Nasopharyngeal Swab     Status: None   Collection Time: 06/20/21  8:36 PM   Specimen: Nasopharyngeal Swab; Nasopharyngeal(NP) swabs in vial transport medium  Result Value Ref Range Status   SARS Coronavirus 2 by RT PCR NEGATIVE NEGATIVE Final    Comment: (NOTE) SARS-CoV-2 target nucleic acids are NOT DETECTED.  The SARS-CoV-2 RNA is generally detectable in upper respiratory specimens during the acute phase of infection. The lowest concentration of SARS-CoV-2 viral copies this assay can detect is 138 copies/mL. A negative result does not preclude SARS-Cov-2 infection and should not be used as the sole basis for treatment or other patient management decisions. A negative result may occur with  improper specimen collection/handling, submission of specimen other than nasopharyngeal swab, presence of viral  mutation(s) within the areas targeted by this assay, and inadequate number of viral copies(<138 copies/mL). A negative result must be combined with clinical observations, patient history, and epidemiological information. The  expected result is Negative.  Fact Sheet for Patients:  EntrepreneurPulse.com.au  Fact Sheet for Healthcare Providers:  IncredibleEmployment.be  This test is no t yet approved or cleared by the [place] FDA and  has been authorized for detection and/or diagnosis of SARS-CoV-2 by FDA under an Emergency Use Authorization (EUA). This EUA will remain  in effect (meaning this test can be used) for the duration of the COVID-19 declaration under Section 564(b)(1) of the Act, 21 U.S.C.section 360bbb-3(b)(1), unless the authorization is terminated  or revoked sooner.       Influenza A by PCR NEGATIVE NEGATIVE Final   Influenza B by PCR NEGATIVE NEGATIVE Final    Comment: (NOTE) The Xpert Xpress SARS-CoV-2/FLU/RSV plus assay is intended as an aid in the diagnosis of influenza from Nasopharyngeal swab specimens and should not be used as a sole basis for treatment. Nasal washings and aspirates are unacceptable for Xpert Xpress SARS-CoV-2/FLU/RSV testing.  Fact Sheet for Patients: EntrepreneurPulse.com.au  Fact Sheet for Healthcare Providers: IncredibleEmployment.be  This test is not yet approved or cleared by the [place] FDA and has been authorized for detection and/or diagnosis of SARS-CoV-2 by FDA under an Emergency Use Authorization (EUA). This EUA will remain in effect (meaning this test can be used) for the duration of the COVID-19 declaration under Section 564(b)(1) of the Act, 21 U.S.C. section 360bbb-3(b)(1), unless the authorization is terminated or revoked.  Performed at [facility], [place] [address], [place], [place] [address]      Radiological Exams  on Admission: CT ABDOMEN PELVIS W CONTRAST  Result Date: 06/20/2021 CLINICAL DATA:  GI bleed. Epigastric pain. Concern for C diff colitis given recent antibiotics versus less likely a stomach ulcer. EXAM: CT ABDOMEN AND PELVIS WITH CONTRAST TECHNIQUE: Multidetector CT imaging of the abdomen and pelvis was performed using the standard protocol following bolus administration of intravenous contrast. CONTRAST:  84mL OMNIPAQUE IOHEXOL 350 MG/ML SOLN COMPARISON:  05/17/2014 FINDINGS: Lower chest: Linear atelectasis or scarring in the right lower lobe. 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 No confluent airspace disease or pleural effusion. Hepatobiliary: Scattered low-density lesions throughout the liver are all subcentimeter, similar to prior exam and likely cysts. No suspicious liver lesion. Gallbladder physiologically distended, no calcified stone. No biliary dilatation. Pancreas: No ductal dilatation or inflammation. Spleen: Normal in size without focal abnormality. Adrenals/Urinary Tract: No adrenal nodule. No hydronephrosis. There are bilateral parapelvic cysts. 6 mm nonobstructing stone in the lower left kidney there is homogeneous renal enhancement with symmetric excretion on delayed phase imaging. Partially distended and unremarkable urinary bladder. Stomach/Bowel: Decompressed stomach. Fall there is no small bowel obstruction or inflammation. No small bowel wall thickening. Occasional fecalization of distal small bowel contents. Appendix not visualized, reported history of appendectomy. There is multifocal colonic diverticulosis. Equivocal minor wall thickening of the sigmoid colon, for example series 2, image 50, but no focal diverticulitis or pericolonic inflammation. No liquid stool in the colon. Small volume of formed stool. Vascular/Lymphatic: Aortic atherosclerosis. No aortic aneurysm. Patent portal and mesenteric veins. No portal venous or mesenteric gas. No abdominopelvic adenopathy.  Reproductive: Uterus is not visualized, presumably surgically absent. No adnexal mass Other: No ascites. No free air. No inflammatory change in the abdomen or pelvis. Abdominal wall hernia. Musculoskeletal: There are no acute or suspicious osseous abnormalities. Chronic appearing anterior wedging of L1 vertebral body. L5-S1 degenerative disc disease. IMPRESSION: 1. Equivocal minor wall thickening of the sigmoid colon in the region of multiple colonic diverticula. This  may represent minimal colitis, but no focally inflamed diverticulum to suggest diverticulitis. No CT findings to suggest C diff colitis. 2. No other acute abnormality in the abdomen/pelvis. 3. Nonobstructing left renal stone. Bilateral parapelvic cysts. Aortic Atherosclerosis (ICD10-I70.0). Electronically Signed   By: Keith Rake M.D.   On: 06/20/2021 18:09      Assessment/Plan Principal Problem:   GI bleed Active Problems:   Essential hypertension   CAD (coronary artery disease)   Asthma   Diverticulosis of colon   Hyperlipidemia   Hypokalemia     #1 GI bleed: Patient reported bright red blood per rectum.  This could still be an upper GI bleed with elevated BUN.  Possible diverticular bleed or even colitis.  Patient will be admitted.  GI already consulted.  Plan is to get IV Protonix and IV fluids.  Keep her n.p.o.  Follow GI recommendations.  No evidence of infection so we will hold off on IV antibiotics at the moment.  #2 essential hypertension: Patient will be NPO.  As needed labetalol.  #3 hypokalemia: Continue to replete.  #4 coronary artery disease: Stable no decompensation.  Hemoglobin is above 10 g.  Transfuse if less than 8.  #5 hyperlipidemia: Patient is NPO.  We will hold off on medications.  #6 diverticular disease: May be contributing to the bleeding.  Continue to monitor  #7 history of asthma: No acute exacerbation.  Continue monitoring    DVT prophylaxis: SCD Code Status: Full code Family  Communication: No family at bedside Disposition Plan: Home Consults called: [facility] Admission status: Inpatient  Severity of Illness: The appropriate patient status for this patient is INPATIENT. Inpatient status is judged to be reasonable and necessary in order to provide the required intensity of service to ensure the patient's safety. The patient's presenting symptoms, physical exam findings, and initial radiographic and laboratory data in the context of their chronic comorbidities is felt to place them at high risk for further clinical deterioration. Furthermore, it is not anticipated that the patient will be medically stable for discharge from the hospital within 2 midnights of admission. The following factors support the patient status of inpatient.   " The patient's presenting symptoms include rectal bleed. " The worrisome physical exam findings include mild epigastric tenderness. " The initial radiographic and laboratory data are worrisome because of fecal occult blood positive. " The chronic co-morbidities include diverticular disease.   * I certify that at the point of admission it is my clinical judgment that the patient will require inpatient hospital care spanning beyond 2 midnights from the point of admission due to high intensity of service, high risk for further deterioration and high frequency of surveillance required.Barbette Merino MD [facility] Pager [phone]  If 7PM-7AM, please contact night-coverage [email or web address] Password [facility]  06/20/2021, 10:08 PM

## 2021-06-20 NOTE — ED Provider Notes (Signed)
I provided a substantive portion of the care of this patient.  I personally performed the entirety of the medical decision making for this encounter.      75 year old female presents with abdominal pain after having 10 diarrhea stools per abdominal CT does show colitis.  She had guaiac positive stools here.  Will admit to the hospitalist service   Lacretia Leigh, MD 06/20/21 2028

## 2021-06-21 ENCOUNTER — Encounter (HOSPITAL_COMMUNITY)
Admission: EM | Disposition: A | Discharge status: Home / Self Care | Payer: Self-pay | Source: Home / Self Care | Attending: Internal Medicine

## 2021-06-21 ENCOUNTER — Inpatient Hospital Stay (HOSPITAL_COMMUNITY): Payer: Medicare Other | Admitting: Certified Registered Nurse Anesthetist

## 2021-06-21 ENCOUNTER — Encounter (HOSPITAL_COMMUNITY): Payer: Self-pay | Admitting: Internal Medicine

## 2021-06-21 DIAGNOSIS — K922 Gastrointestinal hemorrhage, unspecified: Secondary | ICD-10-CM | POA: Diagnosis not present

## 2021-06-21 DIAGNOSIS — E785 Hyperlipidemia, unspecified: Secondary | ICD-10-CM | POA: Diagnosis not present

## 2021-06-21 DIAGNOSIS — K259 Gastric ulcer, unspecified as acute or chronic, without hemorrhage or perforation: Secondary | ICD-10-CM | POA: Diagnosis not present

## 2021-06-21 DIAGNOSIS — D649 Anemia, unspecified: Secondary | ICD-10-CM | POA: Diagnosis not present

## 2021-06-21 DIAGNOSIS — I10 Essential (primary) hypertension: Secondary | ICD-10-CM | POA: Diagnosis not present

## 2021-06-21 DIAGNOSIS — K921 Melena: Secondary | ICD-10-CM

## 2021-06-21 DIAGNOSIS — I2510 Atherosclerotic heart disease of native coronary artery without angina pectoris: Secondary | ICD-10-CM | POA: Diagnosis not present

## 2021-06-21 DIAGNOSIS — K250 Acute gastric ulcer with hemorrhage: Secondary | ICD-10-CM

## 2021-06-21 HISTORY — PX: ESOPHAGOGASTRODUODENOSCOPY: SHX5428

## 2021-06-21 HISTORY — PX: BIOPSY: SHX5522

## 2021-06-21 LAB — CBC
HCT: 24.6 % — ABNORMAL LOW (ref 36.0–46.0)
HCT: 25.6 % — ABNORMAL LOW (ref 36.0–46.0)
HCT: 27.1 % — ABNORMAL LOW (ref 36.0–46.0)
Hemoglobin: 7.9 g/dL — ABNORMAL LOW (ref 12.0–15.0)
Hemoglobin: 7.9 g/dL — ABNORMAL LOW (ref 12.0–15.0)
Hemoglobin: 8.6 g/dL — ABNORMAL LOW (ref 12.0–15.0)
MCH: 31.9 pg (ref 26.0–34.0)
MCH: 32 pg (ref 26.0–34.0)
MCH: 32.4 pg (ref 26.0–34.0)
MCHC: 30.9 g/dL (ref 30.0–36.0)
MCHC: 31.7 g/dL (ref 30.0–36.0)
MCHC: 32.1 g/dL (ref 30.0–36.0)
MCV: 100.7 fL — ABNORMAL HIGH (ref 80.0–100.0)
MCV: 100.8 fL — ABNORMAL HIGH (ref 80.0–100.0)
MCV: 103.2 fL — ABNORMAL HIGH (ref 80.0–100.0)
Platelets: 185 10*3/uL (ref 150–400)
Platelets: 195 10*3/uL (ref 150–400)
Platelets: 219 10*3/uL (ref 150–400)
RBC: 2.44 MIL/uL — ABNORMAL LOW (ref 3.87–5.11)
RBC: 2.48 MIL/uL — ABNORMAL LOW (ref 3.87–5.11)
RBC: 2.69 MIL/uL — ABNORMAL LOW (ref 3.87–5.11)
RDW: 13.1 % (ref 11.5–15.5)
RDW: 13.2 % (ref 11.5–15.5)
RDW: 13.2 % (ref 11.5–15.5)
WBC: 4.5 10*3/uL (ref 4.0–10.5)
WBC: 5.9 10*3/uL (ref 4.0–10.5)
WBC: 8.5 10*3/uL (ref 4.0–10.5)
nRBC: 0 % (ref 0.0–0.2)
nRBC: 0 % (ref 0.0–0.2)
nRBC: 0 % (ref 0.0–0.2)

## 2021-06-21 LAB — COMPREHENSIVE METABOLIC PANEL
ALT: 18 U/L (ref 0–44)
AST: 20 U/L (ref 15–41)
Albumin: 3.3 g/dL — ABNORMAL LOW (ref 3.5–5.0)
Alkaline Phosphatase: 49 U/L (ref 38–126)
Anion gap: 8 (ref 5–15)
BUN: 28 mg/dL — ABNORMAL HIGH (ref 8–23)
CO2: 29 mmol/L (ref 22–32)
Calcium: 8.4 mg/dL — ABNORMAL LOW (ref 8.9–10.3)
Chloride: 102 mmol/L (ref 98–111)
Creatinine, Ser: 0.82 mg/dL (ref 0.44–1.00)
GFR, Estimated: >60 mL/min (ref >60)
Glucose, Bld: 101 mg/dL — ABNORMAL HIGH (ref 70–99)
Potassium: 3.6 mmol/L (ref 3.5–5.1)
Sodium: 139 mmol/L (ref 135–145)
Total Bilirubin: 0.6 mg/dL (ref 0.3–1.2)
Total Protein: 5.3 g/dL — ABNORMAL LOW (ref 6.5–8.1)

## 2021-06-21 LAB — ABO/RH: ABO/RH(D): A POS

## 2021-06-21 SURGERY — ESOPHAGOGASTRODUODENOSCOPY (EGD) WITH PROPOFOL
Anesthesia: Monitor Anesthesia Care

## 2021-06-21 SURGERY — EGD (ESOPHAGOGASTRODUODENOSCOPY)
Anesthesia: Monitor Anesthesia Care

## 2021-06-21 MED ORDER — FLUTICASONE PROPIONATE 50 MCG/ACT NA SUSP
1.0000 | Freq: Two times a day (BID) | NASAL | Status: DC
  Administered 2021-06-22 – 2021-06-24 (×5): 1 via NASAL
  Filled 2021-06-21: qty 16

## 2021-06-21 MED ORDER — ONDANSETRON HCL 4 MG/2ML IJ SOLN
4.0000 mg | Freq: Four times a day (QID) | INTRAMUSCULAR | Status: DC | PRN
  Administered 2021-06-22: 4 mg via INTRAVENOUS
  Filled 2021-06-21: qty 2

## 2021-06-21 MED ORDER — KETOTIFEN FUMARATE 0.025 % OP SOLN: 1.0000 [drp] | Freq: Two times a day (BID) | OPHTHALMIC | Status: DC | PRN

## 2021-06-21 MED ORDER — ROSUVASTATIN CALCIUM 5 MG PO TABS
5.0000 mg | ORAL_TABLET | Freq: Every day | ORAL | Status: DC
  Administered 2021-06-21 – 2021-06-24 (×4): 5 mg via ORAL
  Filled 2021-06-21 (×4): qty 1

## 2021-06-21 MED ORDER — PROPOFOL 500 MG/50ML IV EMUL
INTRAVENOUS | Status: DC | PRN
  Administered 2021-06-21: 125 ug/kg/min via INTRAVENOUS

## 2021-06-21 MED ORDER — SODIUM CHLORIDE 0.9 % IV SOLN: INTRAVENOUS | Status: DC

## 2021-06-21 MED ORDER — POLYVINYL ALCOHOL 1.4 % OP SOLN: 1.0000 [drp] | OPHTHALMIC | Status: DC | PRN

## 2021-06-21 MED ORDER — PANTOPRAZOLE SODIUM 40 MG PO TBEC
40.0000 mg | DELAYED_RELEASE_TABLET | Freq: Two times a day (BID) | ORAL | Status: DC
  Administered 2021-06-21 – 2021-06-24 (×6): 40 mg via ORAL
  Filled 2021-06-21 (×6): qty 1

## 2021-06-21 MED ORDER — ACETAMINOPHEN 325 MG PO TABS
650.0000 mg | ORAL_TABLET | Freq: Four times a day (QID) | ORAL | Status: DC | PRN
Start: 2021-06-21 — End: 2021-06-24
  Administered 2021-06-21: 650 mg via ORAL
  Filled 2021-06-21: qty 2

## 2021-06-21 MED ORDER — ONDANSETRON HCL 4 MG PO TABS: 4.0000 mg | ORAL_TABLET | Freq: Four times a day (QID) | ORAL | Status: DC | PRN

## 2021-06-21 MED ORDER — IRBESARTAN 150 MG PO TABS: 150.0000 mg | ORAL_TABLET | Freq: Every day | ORAL | Status: DC

## 2021-06-21 MED ORDER — BUDESONIDE 0.5 MG/2ML IN SUSP
0.5000 mg | Freq: Two times a day (BID) | RESPIRATORY_TRACT | Status: DC
  Administered 2021-06-21 – 2021-06-24 (×6): 0.5 mg via RESPIRATORY_TRACT
  Filled 2021-06-21 (×6): qty 2

## 2021-06-21 MED ORDER — NEBIVOLOL HCL 2.5 MG PO TABS
2.5000 mg | ORAL_TABLET | Freq: Every day | ORAL | Status: DC
  Administered 2021-06-21 – 2021-06-24 (×4): 2.5 mg via ORAL
  Filled 2021-06-21 (×4): qty 1

## 2021-06-21 MED ORDER — TRAZODONE HCL 50 MG PO TABS: 25.0000 mg | ORAL_TABLET | Freq: Once | ORAL | Status: DC

## 2021-06-21 MED ORDER — POTASSIUM CHLORIDE CRYS ER 20 MEQ PO TBCR
20.0000 meq | EXTENDED_RELEASE_TABLET | Freq: Two times a day (BID) | ORAL | Status: DC
  Administered 2021-06-21: 20 meq via ORAL
  Filled 2021-06-21: qty 2

## 2021-06-21 MED ORDER — LACTATED RINGERS IV SOLN: INTRAVENOUS | Status: DC

## 2021-06-21 MED ORDER — ALBUTEROL SULFATE (2.5 MG/3ML) 0.083% IN NEBU: 2.5000 mg | INHALATION_SOLUTION | RESPIRATORY_TRACT | Status: DC | PRN

## 2021-06-21 MED ORDER — PROPOFOL 10 MG/ML IV BOLUS
INTRAVENOUS | Status: DC | PRN
  Administered 2021-06-21: 20 mg via INTRAVENOUS

## 2021-06-21 MED ORDER — PROPOFOL 500 MG/50ML IV EMUL: INTRAVENOUS | Status: DC | PRN

## 2021-06-21 MED ORDER — PROPOFOL 1000 MG/100ML IV EMUL
INTRAVENOUS | Status: AC
  Filled 2021-06-21: qty 100

## 2021-06-21 MED ORDER — ALBUTEROL SULFATE HFA 108 (90 BASE) MCG/ACT IN AERS: 2.0000 | INHALATION_SPRAY | RESPIRATORY_TRACT | Status: DC | PRN

## 2021-06-21 MED ORDER — PANTOPRAZOLE SODIUM 40 MG IV SOLR
40.0000 mg | Freq: Two times a day (BID) | INTRAVENOUS | Status: DC
  Administered 2021-06-21 (×2): 40 mg via INTRAVENOUS
  Filled 2021-06-21 (×2): qty 40

## 2021-06-21 MED ORDER — HYDROCHLOROTHIAZIDE 25 MG PO TABS: 25.0000 mg | ORAL_TABLET | Freq: Every day | ORAL | Status: DC

## 2021-06-21 MED ORDER — ESCITALOPRAM OXALATE 10 MG PO TABS
10.0000 mg | ORAL_TABLET | Freq: Every day | ORAL | Status: DC
  Administered 2021-06-21 – 2021-06-24 (×4): 10 mg via ORAL
  Filled 2021-06-21 (×4): qty 1

## 2021-06-21 MED ORDER — ESTROGENS, CONJUGATED 0.625 MG/GM VA CREA
0.5000 | TOPICAL_CREAM | VAGINAL | Status: DC
  Administered 2021-06-22: 0.5 via VAGINAL
  Filled 2021-06-21: qty 30

## 2021-06-21 NOTE — Anesthesia Preprocedure Evaluation (Signed)
Anesthesia Evaluation  Patient identified by MRN, date of birth, ID band Patient awake    Reviewed: Allergy & Precautions, NPO status , Patient's Chart, lab work & pertinent test results  Airway Mallampati: II  TM Distance: >3 FB Neck ROM: Full    Dental no notable dental hx.    Pulmonary neg pulmonary ROS, former smoker,    Pulmonary exam normal breath sounds clear to auscultation       Cardiovascular hypertension, + CAD, + Past MI and + Cardiac Stents  Normal cardiovascular exam Rhythm:Regular Rate:Normal     Neuro/Psych Anxiety negative neurological ROS     GI/Hepatic negative GI ROS, Neg liver ROS,   Endo/Other  negative endocrine ROS  Renal/GU negative Renal ROS  negative genitourinary   Musculoskeletal negative musculoskeletal ROS (+)   Abdominal   Peds negative pediatric ROS (+)  Hematology  (+) anemia ,   Anesthesia Other Findings   Reproductive/Obstetrics negative OB ROS                             Anesthesia Physical Anesthesia Plan  ASA: 3  Anesthesia Plan: MAC   Post-op Pain Management:    Induction: Intravenous  PONV Risk Score and Plan: 2 and Propofol infusion and Treatment may vary due to age or medical condition  Airway Management Planned: Simple Face Mask  Additional Equipment:   Intra-op Plan:   Post-operative Plan:   Informed Consent: I have reviewed the patients History and Physical, chart, labs and discussed the procedure including the risks, benefits and alternatives for the proposed anesthesia with the patient or authorized representative who has indicated his/her understanding and acceptance.     Dental advisory given  Plan Discussed with: CRNA and Surgeon  Anesthesia Plan Comments:         Anesthesia Quick Evaluation

## 2021-06-21 NOTE — Anesthesia Postprocedure Evaluation (Signed)
Anesthesia Post Note  Patient: [name] [name]  Procedure(s) Performed: ESOPHAGOGASTRODUODENOSCOPY (EGD) BIOPSY     Patient location during evaluation: PACU Anesthesia Type: MAC Level of consciousness: awake and alert Pain management: pain level controlled Vital Signs Assessment: post-procedure vital signs reviewed and stable Respiratory status: spontaneous breathing, nonlabored ventilation, respiratory function stable and patient connected to nasal cannula oxygen Cardiovascular status: stable and blood pressure returned to baseline Postop Assessment: no apparent nausea or vomiting Anesthetic complications: no   No notable events documented.  Last Vitals:  Vitals:   06/21/21 1151 06/21/21 1300  BP: (!) 121/52 (!) 104/50  Pulse: 60 (!) 58  Resp: 19 15  Temp: 36.8 C 36.5 C  SpO2: 97% 100%    Last Pain:  Vitals:   06/21/21 1300  TempSrc: Oral  PainSc: 0-No pain                 [name] S

## 2021-06-21 NOTE — Plan of Care (Signed)
?  Problem: Clinical Measurements: ?Goal: Will remain free from infection ?Outcome: Progressing ?  ?

## 2021-06-21 NOTE — Progress Notes (Signed)
PROGRESS NOTE    [name] [name]  JJH:[id number] DOB: October 31, 1946 DOA: 06/20/2021 PCP: Cari Caraway, MD    Brief Narrative:  [name] [name] was admitted to the hospital with the working diagnosis of gastrointestinal bleeding.   75 year old female past medical history for diverticular disease, coronary artery disease, anxiety disorder, asthma, hypertension, and breast cancer who presented with rectal bleeding and abdominal pain. Patient reported 10 episodes of dark stools on the day of hospitalization.  She has been recently on clindamycin for sinus infection.  Positive abdominal pain.  On her initial physical examination temperature 98.4, blood pressure 140/84, heart rate 79, respiratory rate 16, oxygen saturation 96%.  Her lungs are clear to auscultation bilaterally, heart S1-S2, present, rhythmic, abdomen with tenderness in the epigastric region, no lower extremity edema.  Sodium 138, potassium 3.3, chloride 98, bicarb 29, glucose 164, BUN 39, creatinine 0.96, AST 24, ALT 19, white count 12.0, hemoglobin 10.4, hematocrit 32.3, platelets 250. SARS COVID-19 negative.  Urinalysis specific gravity 1.014.  CT of the abdomen pelvis with minor wall thickening of the sigmoid colon in the region of multiple colonic diverticuli.  May represent minimal colitis.  Patient underwent upper endoscopy showing nonbleeding gastric ulcers with a clean ulcer base.  Forrest class III.   Assessment & Plan:   Principal Problem:   Upper GI bleed Active Problems:   Essential hypertension   CAD (coronary artery disease)   Asthma   Diverticulosis of colon   Hyperlipidemia   Hypokalemia   Anemia   Acute gastric ulcer with hemorrhage   Upper GI bleed due to gastric ulcers. Patient sp EGD, her abdominal pain has improved, but not yet back to baseline, no nausea or vomiting. Diet has been advanced with good toleration  Plan to continue pantoprazole po bid 40 mg and follow up H&H in am If stable plan for  dc home.   2. HTN. Dyslipidemia Resume blood pressure regimen  nebivolol. Hold on irbersatan and HCTZ today to prevent hypotension.  Continue with rosuvastatin   3. Asthma. No signs of exacerbation, continue with budesonide.   4. Hypokalemia. Resume K supplementation    Status is: Inpatient  Remains inpatient appropriate because:Inpatient level of care appropriate due to severity of illness  Dispo: The patient is from: Home              Anticipated d/c is to: Home              Patient currently is not medically stable to d/c.   Difficult to place patient No   DVT prophylaxis: Scd   Code Status:    full  Family Communication:  No family at the bedside     Consultants:  GI   Procedures:  EGD     Subjective: Patient with improvement in abdominal pain, no nausea or vomiting, no chest pain or dyspnea,   Objective: Vitals:   06/21/21 1300 06/21/21 1310 06/21/21 1319 06/21/21 1342  BP: (!) 104/50 (!) 127/58 (!) 124/51 122/64  Pulse: (!) 58 (!) 58  (!) 59  Resp: 15 15 11 18   Temp: 97.7 F (36.5 C)   (!) 97.5 F (36.4 C)  TempSrc: Oral   Oral  SpO2: 100% 94% 100% 100%  Weight:      Height:        Intake/Output Summary (Last 24 hours) at 06/21/2021 1553 Last data filed at 06/21/2021 1256 Gross per 24 hour  Intake 926.7 ml  Output --  Net 926.7 ml   Danley Danker  Weights   06/20/21 1515 06/21/21 1151  Weight: 59.9 kg 59.8 kg    Examination:   General: Not in pain or dyspnea, deconditioned  Neurology: Awake and alert, non focal  E ENT: mild pallor, no icterus, oral mucosa moist Cardiovascular: No JVD. S1-S2 present, rhythmic, no gallops, rubs, or murmurs. No lower extremity edema. Pulmonary: positive breath sounds bilaterally, adequate air movement, no wheezing, rhonchi or rales. Gastrointestinal. Abdomen soft and non tender Skin. No rashes Musculoskeletal: no joint deformities     Data Reviewed: I have personally reviewed following labs and imaging  studies  CBC: Recent Labs  Lab 06/20/21 1601 06/21/21 0026 06/21/21 0558 06/21/21 1415  WBC 12.0* 8.5 5.9 4.5  HGB 10.4* 8.6* 7.9* 7.9*  HCT 32.3* 27.1* 24.6* 25.6*  MCV 99.7 100.7* 100.8* 103.2*  PLT 250 219 185 425   Basic Metabolic Panel: Recent Labs  Lab 06/20/21 1601 06/21/21 0026  NA 138 139  K 3.3* 3.6  CL 98 102  CO2 29 29  GLUCOSE 164* 101*  BUN 39* 28*  CREATININE 0.96 0.82  CALCIUM 8.7* 8.4*   GFR: Estimated Creatinine Clearance: 48.5 mL/min (by C-G formula based on SCr of 0.82 mg/dL). Liver Function Tests: Recent Labs  Lab 06/20/21 1601 06/21/21 0026  AST 24 20  ALT 19 18  ALKPHOS 60 49  BILITOT 0.6 0.6  PROT 6.6 5.3*  ALBUMIN 3.9 3.3*   Recent Labs  Lab 06/20/21 1601  LIPASE 26   No results for input(s): AMMONIA in the last 168 hours. Coagulation Profile: Recent Labs  Lab 06/20/21 1603  INR 1.0   Cardiac Enzymes: No results for input(s): CKTOTAL, CKMB, CKMBINDEX, TROPONINI in the last 168 hours. BNP (last 3 results) No results for input(s): PROBNP in the last 8760 hours. HbA1C: No results for input(s): HGBA1C in the last 72 hours. CBG: No results for input(s): GLUCAP in the last 168 hours. Lipid Profile: No results for input(s): CHOL, HDL, LDLCALC, TRIG, CHOLHDL, LDLDIRECT in the last 72 hours. Thyroid Function Tests: No results for input(s): TSH, T4TOTAL, FREET4, T3FREE, THYROIDAB in the last 72 hours. Anemia Panel: No results for input(s): VITAMINB12, FOLATE, FERRITIN, TIBC, IRON, RETICCTPCT in the last 72 hours.    Radiology Studies: I have reviewed all of the imaging during this hospital visit personally     Scheduled Meds:  pantoprazole (PROTONIX) IV  40 mg Intravenous Q12H   Continuous Infusions:  lactated ringers 100 mL/hr at 06/21/21 1538   lactated ringers 10 mL/hr at 06/21/21 1234     LOS: 1 day        Marv Alfrey Gerome Apley, MD

## 2021-06-21 NOTE — Consult Note (Addendum)
Referring Provider:  EDP Primary Care Physician:  Cari Caraway, MD Primary Gastroenterologist:  Dr. Silverio Decamp  Reason for Consultation:  Anemia, drop in Hgb, diarrhea  HPI: [name] [name] is a 75 y.o. female with medical history significant of diverticular disease, coronary artery disease (only on ASA 81 mg daily at home), anxiety disorder, asthma, breast cancer, essential hypertension, IBS who presented to the ER with sudden onset of diarrhea and emesis.  She tells me that she had been on clindamycin for 4 days for a sinus infection when her symptoms began.  She says that she felt awful on Monday and then at 1 AM Tuesday morning she woke up with an large episode of vomiting black material followed by several episodes of black diarrhea.  Was also having lower abdominal cramping and epigastric burning.  In the ED CT scan of the abdomen and pelvis with contrast showed the following:  IMPRESSION: 1. Equivocal minor wall thickening of the sigmoid colon in the region of multiple colonic diverticula. This may represent minimal colitis, but no focally inflamed diverticulum to suggest diverticulitis. No CT findings to suggest C diff colitis. 2. No other acute abnormality in the abdomen/pelvis. 3. Nonobstructing left renal stone. Bilateral parapelvic cysts.   Aortic Atherosclerosis (ICD10-I70.0).  Hgb was 10.4 grams.  Only one for comparison was 13.6 grams from 7 years prior.  BUN was elevated at 39.  She was heme positive.  Started on pantoprazole 40 mg IV BID.  She says that today with is feeling much better.  She has not had any diarrhea since about 11 AM yesterday.  No further vomiting other than that one episode at home.  Abdominal pain is improved/resolved.  She denies NSAID use.  Never had an EGD in the past.  Take pepcid at home prn for heartburn with certain foods.  Hgb this AM is 7.9 grams.  BUN improved.  Colonoscopy 04/2016:  - One 3 mm polyp in the ascending colon, removed with a cold  biopsy forceps. Resected and retrieved. - Severe Diverticulosis in the entire examined colon. - Non-bleeding internal hemorrhoids.   Past Medical History:  Diagnosis Date   Allergic rhinitis    Anxiety disorder    Asthma    Breast cancer ([place])    bilateral   CAD (coronary artery disease)    a. NSTEMI 8/04 => LHC 07/2003:  mLAD hazy 75% => Taxus DES, EF 30% with ant-apical, apical and inf-apical AK.  b.  Echo 4/05: EF 60%.  c.  ETT/Lexiscan Myoview 06/2011: EF 80%, no ischemia, normal wall motion   Diverticulosis 11/13/2004   Hyperplastic colon polyp 10/16/1999   Hypertension    IBS (irritable bowel syndrome)    Internal and external hemorrhoids without complication 95/18/8416   MI (myocardial infarction) ([place]) 12/03/2002   Anteroapical MI with PCI to the LAD   Positive PPD    PVC (premature ventricular contraction)     Past Surgical History:  Procedure Laterality Date   APPENDECTOMY     bilateral mastectomy     CARDIOVASCULAR STRESS TEST  07/01/2006   ef 80%   CORONARY ANGIOPLASTY WITH STENT PLACEMENT  2004   LAD   US ECHOCARDIOGRAPHY  03/27/2004   resting ef 60%    Prior to Admission medications   Medication Sig Start Date End Date Taking? Authorizing Provider  acetaminophen (TYLENOL) 325 MG tablet Take 650 mg by mouth every 6 (six) hours as needed for mild pain.     [provider]  albuterol (  PROAIR HFA) 108 (90 BASE) MCG/ACT inhaler Inhale 2 puffs into the lungs every 6 (six) hours as needed for shortness of breath.     [provider]  aspirin 81 MG tablet Take 81 mg by mouth daily.     [provider]  calcium carbonate (OS-CAL) 600 MG TABS Take 600 mg by mouth daily. 1Tablet Daily    [provider]  denosumab (PROLIA) 60 MG/ML SOSY injection Inject 60 mg into the skin every 6 (six) months.    [provider]  escitalopram (LEXAPRO) 10 MG tablet Take 10 mg by mouth daily.    [provider]  fluticasone (FLONASE)  50 MCG/ACT nasal spray Place 1 spray into both nostrils daily as needed for allergies.  01/08/14   [provider]  hydrochlorothiazide (HYDRODIURIL) 25 MG tablet Take 1 tablet (25 mg total) by mouth daily. 02/13/16   Nahser, Wonda Cheng, MD  irbesartan (AVAPRO) 150 MG tablet Take 150 mg by mouth daily. 08/07/17   [provider]  levocetirizine (XYZAL) 5 MG tablet Take 5 mg by mouth every evening.    [provider]  metroNIDAZOLE (METROGEL) 0.75 % gel Apply 1 application topically at bedtime. 07/04/20   [provider]  nebivolol (BYSTOLIC) 2.5 MG tablet Take 1 tablet (2.5 mg total) by mouth daily. 09/05/20   Nahser, Wonda Cheng, MD  nitroGLYCERIN (NITROSTAT) 0.4 MG SL tablet Place 0.4 mg under the tongue as needed.    [provider]  Potassium Chloride ER 20 MEQ TBCR Take 20 mEq by mouth 2 (two) times daily. 01/13/15   Nahser, Wonda Cheng, MD  potassium chloride SA (KLOR-CON) 20 MEQ tablet Take 20 mEq by mouth 2 (two) times daily. 07/23/20   [provider]  PREMARIN vaginal cream Place 0.5 Applicatorfuls vaginally 2 (two) times a week. On Tuesday and Saturday     Use as directed 05/03/11   [provider]  rosuvastatin (CRESTOR) 5 MG tablet Take 1 tablet (5 mg total) by mouth daily. 01/13/13   Nahser, Wonda Cheng, MD  sulfamethoxazole-trimethoprim (BACTRIM) 400-80 MG tablet Take 1 tablet by mouth daily. 07/06/20   [provider]  trimethoprim (TRIMPEX) 100 MG tablet Take 100 mg by mouth daily. 03/27/20   [provider]    Current Facility-Administered Medications  Medication Dose Route Frequency Provider Last Rate Last Admin   acetaminophen (TYLENOL) tablet 650 mg  650 mg Oral Q6H PRN Kathryne Eriksson, NP   650 mg at 06/21/21 0310   lactated ringers infusion   Intravenous Continuous Gala Romney L, MD 100 mL/hr at 06/21/21 0104 New Bag at 06/21/21 0104   ondansetron (ZOFRAN) tablet 4 mg  4 mg Oral Q6H PRN Elwyn Reach, MD        Or   ondansetron (ZOFRAN) injection 4 mg  4 mg Intravenous Q6H PRN Elwyn Reach, MD       pantoprazole (PROTONIX) injection 40 mg  40 mg Intravenous Q12H Gala Romney L, MD   40 mg at 06/21/21 0058    Allergies as of 06/20/2021 - Review Complete 06/20/2021  Allergen Reaction Noted   Codeine Other (See Comments) 02/09/2014   Lisinopril Cough 02/28/2011   Simvastatin Diarrhea 02/28/2011    Family History  Problem Relation Age of Onset   Breast cancer Mother    Breast cancer Sister    Heart disease Maternal Grandmother    Asthma Daughter    Spina bifida Unknown    Colon cancer Neg  Hx     Social History   Socioeconomic History   Marital status: Divorced    Spouse name: Not on file   Number of children: Not on file   Years of education: Not on file   Highest education level: Not on file  Occupational History   Occupation: retired  Tobacco Use   Smoking status: Former    Types: Cigarettes    Quit date: 08/07/1966    Years since quitting: 54.9   Smokeless tobacco: Never  Vaping Use   Vaping Use: Never used  Substance and Sexual Activity   Alcohol use: Yes    Alcohol/week: 0.0 standard drinks    Comment: once every other month   Drug use: No   Sexual activity: Not on file    Comment: not asked  Other Topics Concern   Not on file  Social History Narrative   Not on file   Social Determinants of Health   Financial Resource Strain: Not on file  Food Insecurity: Not on file  Transportation Needs: Not on file  Physical Activity: Not on file  Stress: Not on file  Social Connections: Not on file  Intimate Partner Violence: Not on file    Review of Systems: ROS is O/W negative except as mentioned in HPI.  Physical Exam: Vital signs in last 24 hours: Temp:  [98.2 F (36.8 C)-98.4 F (36.9 C)] 98.3 F (36.8 C) (07/20 0807) Pulse Rate:  [59-79] 59 (07/20 0807) Resp:  [12-20] 16 (07/20 0807) BP: (100-140)/(53-90) 102/53 (07/20 0807) SpO2:  [95 %-100 %] 99 %  (07/20 0807) Weight:  [59.9 kg] 59.9 kg (07/19 1515) Last BM Date: 06/20/21 General:  Alert, Well-developed, well-nourished, pleasant and cooperative in NAD Head:  Normocephalic and atraumatic. Eyes:  Sclera clear, no icterus.  Conjunctiva pink. Ears:  Normal auditory acuity. Mouth:  No deformity or lesions.   Lungs:  Clear throughout to auscultation.  No wheezes, crackles, or rhonchi.  Heart:  Regular rate and rhythm; no murmurs, clicks, rubs, or gallops. Abdomen:  Soft, non-distended.  BS present.  Non-tender. Rectal:  Was heme positive in the ED. Msk:  Symmetrical without gross deformities. Pulses:  Normal pulses noted. Extremities:  Without clubbing or edema. Neurologic:  Alert and oriented x 4;  grossly normal neurologically. Skin:  Intact without significant lesions or rashes. Psych:  Alert and cooperative. Normal mood and affect.  Intake/Output from previous day: 07/19 0701 - 07/20 0700 In: 626.7 [I.V.:91.6; IV Piggyback:535.2] Out: -   Lab Results: Recent Labs    06/20/21 1601 06/21/21 0026 06/21/21 0558  WBC 12.0* 8.5 5.9  HGB 10.4* 8.6* 7.9*  HCT 32.3* 27.1* 24.6*  PLT 250 219 185   BMET Recent Labs    06/20/21 1601 06/21/21 0026  NA 138 139  K 3.3* 3.6  CL 98 102  CO2 29 29  GLUCOSE 164* 101*  BUN 39* 28*  CREATININE 0.96 0.82  CALCIUM 8.7* 8.4*   LFT Recent Labs    06/21/21 0026  PROT 5.3*  ALBUMIN 3.3*  AST 20  ALT 18  ALKPHOS 49  BILITOT 0.6   PT/INR Recent Labs    06/20/21 1603  LABPROT 13.5  INR 1.0   Studies/Results: CT ABDOMEN PELVIS W CONTRAST  Result Date: 06/20/2021 CLINICAL DATA:  GI bleed. Epigastric pain. Concern for C diff colitis given recent antibiotics versus less likely a stomach ulcer. EXAM: CT ABDOMEN AND PELVIS WITH CONTRAST TECHNIQUE: Multidetector CT imaging of the abdomen and pelvis was performed  using the standard protocol following bolus administration of intravenous contrast. CONTRAST:  17mL OMNIPAQUE IOHEXOL  350 MG/ML SOLN COMPARISON:  05/17/2014 FINDINGS: Lower chest: Linear atelectasis or scarring in the right lower lobe. 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 No confluent airspace disease or pleural effusion. Hepatobiliary: Scattered low-density lesions throughout the liver are all subcentimeter, similar to prior exam and likely cysts. No suspicious liver lesion. Gallbladder physiologically distended, no calcified stone. No biliary dilatation. Pancreas: No ductal dilatation or inflammation. Spleen: Normal in size without focal abnormality. Adrenals/Urinary Tract: No adrenal nodule. No hydronephrosis. There are bilateral parapelvic cysts. 6 mm nonobstructing stone in the lower left kidney there is homogeneous renal enhancement with symmetric excretion on delayed phase imaging. Partially distended and unremarkable urinary bladder. Stomach/Bowel: Decompressed stomach. Fall there is no small bowel obstruction or inflammation. No small bowel wall thickening. Occasional fecalization of distal small bowel contents. Appendix not visualized, reported history of appendectomy. There is multifocal colonic diverticulosis. Equivocal minor wall thickening of the sigmoid colon, for example series 2, image 50, but no focal diverticulitis or pericolonic inflammation. No liquid stool in the colon. Small volume of formed stool. Vascular/Lymphatic: Aortic atherosclerosis. No aortic aneurysm. Patent portal and mesenteric veins. No portal venous or mesenteric gas. No abdominopelvic adenopathy. Reproductive: Uterus is not visualized, presumably surgically absent. No adnexal mass Other: No ascites. No free air. No inflammatory change in the abdomen or pelvis. Abdominal wall hernia. Musculoskeletal: There are no acute or suspicious osseous abnormalities. Chronic appearing anterior wedging of L1 vertebral body. L5-S1 degenerative disc disease. IMPRESSION: 1. Equivocal minor wall thickening of the sigmoid colon in the region of  multiple colonic diverticula. This may represent minimal colitis, but no focally inflamed diverticulum to suggest diverticulitis. No CT findings to suggest C diff colitis. 2. No other acute abnormality in the abdomen/pelvis. 3. Nonobstructing left renal stone. Bilateral parapelvic cysts. Aortic Atherosclerosis (ICD10-I70.0). Electronically Signed   By: Keith Rake M.D.   On: 06/20/2021 18:09    IMPRESSION:  *Black/heme positive stools and one episode of black emesis with drop in Hgb to 7.9 grams this AM as compared to 13.6 grams but that was 7 years ago.  BUN is elevated.  Much improved symptomatically this AM.  Was on clindamycin for 4 days for a sinus infection when these symptoms started.  PLAN: -Continue pantoprazole 40 mg IV BID. -Likely EGD later today. -Await results of Cdiff stool testing if diarrhea returns. -Monitor Hgb and transfuse prn.   Laban Emperor. Miachel Nardelli  06/21/2021, 9:06 AM

## 2021-06-21 NOTE — Anesthesia Procedure Notes (Signed)
Procedure Name: MAC Date/Time: 06/21/2021 12:37 PM Performed by: Claudia Desanctis, CRNA Pre-anesthesia Checklist: Patient identified, Emergency Drugs available, Suction available and Patient being monitored Patient Re-evaluated:Patient Re-evaluated prior to induction Oxygen Delivery Method: Simple face mask

## 2021-06-21 NOTE — Transfer of Care (Signed)
Immediate Anesthesia Transfer of Care Note  Patient: [name] [name]  Procedure(s) Performed: ESOPHAGOGASTRODUODENOSCOPY (EGD) BIOPSY  Patient Location: Endoscopy Unit  Anesthesia Type:MAC  Level of Consciousness: awake and patient cooperative  Airway & Oxygen Therapy: Patient Spontanous Breathing and Patient connected to face mask  Post-op Assessment: Report given to RN and Post -op Vital signs reviewed and stable  Post vital signs: Reviewed and stable  Last Vitals:  Vitals Value Taken Time  BP    Temp    Pulse 54 06/21/21 1259  Resp 16 06/21/21 1259  SpO2 100 % 06/21/21 1259  Vitals shown include unvalidated device data.  Last Pain:  Vitals:   06/21/21 1151  TempSrc: Oral  PainSc: 0-No pain      Patients Stated Pain Goal: 0 (03/47/42 5956)  Complications: No notable events documented.

## 2021-06-21 NOTE — Op Note (Signed)
[facility] Patient Name: [name] [name] Procedure Date: 06/21/2021 MRN: [id number] Attending MD: Carlota Raspberry. Havery Moros , MD Date of Birth: 03/10/1946 CSN: [id number] Age: 75 Admit Type: Inpatient Procedure:                Upper GI endoscopy Indications:              Melena - suspect upper GI bleed Providers:                Remo Lipps P. Havery Moros, MD, Mariana Arn, Ladona Ridgel, Technician, Dellie Catholic Referring MD:              Medicines:                Monitored Anesthesia Care Complications:            No immediate complications. Estimated blood loss:                            Minimal. Estimated Blood Loss:     Estimated blood loss was minimal. Estimated blood                            loss: none. Procedure:                Pre-Anesthesia Assessment:                           - Prior to the procedure, a History and Physical                            was performed, and patient medications and                            allergies were reviewed. The patient's tolerance of                            previous anesthesia was also reviewed. The risks                            and benefits of the procedure and the sedation                            options and risks were discussed with the patient.                            All questions were answered, and informed consent                            was obtained. Prior Anticoagulants: The patient has                            taken no previous anticoagulant or antiplatelet                            agents.  ASA Grade Assessment: III - A patient with                            severe systemic disease. After reviewing the risks                            and benefits, the patient was deemed in                            satisfactory condition to undergo the procedure.                           After obtaining informed consent, the endoscope was                            passed under direct  vision. Throughout the                            procedure, the patient's blood pressure, pulse, and                            oxygen saturations were monitored continuously. The                            GIF-H190 ([id number]) Olympus gastroscope was                            introduced through the mouth, and advanced to the                            second part of duodenum. The upper GI endoscopy was                            accomplished without difficulty. The patient                            tolerated the procedure well. Scope In: Scope Out: Findings:      Esophagogastric landmarks were identified: the Z-line was found at 38       cm, the gastroesophageal junction was found at 38 cm and the upper       extent of the gastric folds was found at 40 cm from the incisors.      A 2 cm hiatal hernia was present.      There was a benign gastric inlet patch in the proximal esophagus. The       exam of the esophagus was otherwise normal.      Two non-bleeding cratered gastric ulcers with a clean ulcer base       (Forrest Class III) were found in the gastric antrum. The largest lesion       was 4 mm in largest dimension, smaller about 53m in size. Both with       heaped up margins and prominent nodular / polypoid tissue in the       periphery. Biopsies were taken from the periphery of each lesion with a  cold forceps for histology.      A single 5 to 6 mm sessile polyp was found in the gastric fundus and was       not removed given recent bleeding.      The exam of the stomach was otherwise normal.      Biopsies were taken with a cold forceps in the gastric body, at the       incisura and in the gastric antrum for Helicobacter pylori testing.      The duodenal bulb and second portion of the duodenum were normal. Impression:               - Esophagogastric landmarks identified.                           - 2 cm hiatal hernia.                           - Normal esophagus otherwise                            - Non-bleeding gastric ulcers with a clean ulcer                            base (Forrest Class III), as described above.                            Biopsied.                           - Normal stomach otherwise - biopsies taken to rule                            out H pylori                           - Normal duodenal bulb and second portion of the                            duodenum.                           No heme noted anywhere in the bowel, she has                            stopped bleeding. Moderate Sedation:      No moderate sedation, case performed with MAC Recommendation:           - Patient has a contact number available for                            emergencies. The signs and symptoms of potential                            delayed complications were discussed with the                            patient. Return to normal activities  tomorrow.                            Written discharge instructions were provided to the                            patient.                           - Resume diet                           - Resume previous diet.                           - Continue present medications (protonix)                           - Await pathology results.                           - Would monitor overnight and if she is stable I                            think okay to be discharged home tomorrow                           - As outpatient recommend protonix 56m twice daily                            for one month then once daily thereafter                           - Recommend follow up EGD in a few months to assess                            for mucosal healing                           - Avoid NSAIDs, but okay to continue baby aspirin                            if needed for CAD                           - Call with questions, we will coordinate                            outpatient follow up with Dr. NSilverio DecampProcedure Code(s):        ---  Professional ---                           [id number] Esophagogastroduodenoscopy, flexible,                            transoral; with biopsy, single or multiple Diagnosis Code(s):        ---  Professional ---                           K44.9, Diaphragmatic hernia without obstruction or                            gangrene                           K25.9, Gastric ulcer, unspecified as acute or                            chronic, without hemorrhage or perforation                           K92.1, Melena (includes Hematochezia) CPT copyright 2019 American Medical Association. All rights reserved. The codes documented in this report are preliminary and upon coder review may  be revised to meet current compliance requirements. Remo Lipps P. Jeffren Dombek, MD 06/21/2021 1:08:36 PM This report has been signed electronically. Number of Addenda: 0

## 2021-06-22 DIAGNOSIS — D62 Acute posthemorrhagic anemia: Secondary | ICD-10-CM

## 2021-06-22 DIAGNOSIS — E876 Hypokalemia: Secondary | ICD-10-CM | POA: Diagnosis not present

## 2021-06-22 DIAGNOSIS — R195 Other fecal abnormalities: Secondary | ICD-10-CM

## 2021-06-22 DIAGNOSIS — E785 Hyperlipidemia, unspecified: Secondary | ICD-10-CM | POA: Diagnosis not present

## 2021-06-22 DIAGNOSIS — K922 Gastrointestinal hemorrhage, unspecified: Secondary | ICD-10-CM | POA: Diagnosis not present

## 2021-06-22 DIAGNOSIS — K259 Gastric ulcer, unspecified as acute or chronic, without hemorrhage or perforation: Secondary | ICD-10-CM | POA: Diagnosis not present

## 2021-06-22 DIAGNOSIS — I2510 Atherosclerotic heart disease of native coronary artery without angina pectoris: Secondary | ICD-10-CM | POA: Diagnosis not present

## 2021-06-22 LAB — HEMOGLOBIN AND HEMATOCRIT, BLOOD
HCT: 23.6 % — ABNORMAL LOW (ref 36.0–46.0)
HCT: 35.5 % — ABNORMAL LOW (ref 36.0–46.0)
Hemoglobin: 7.2 g/dL — ABNORMAL LOW (ref 12.0–15.0)
Hemoglobin: 11.5 g/dL — ABNORMAL LOW (ref 12.0–15.0)

## 2021-06-22 LAB — IRON AND TIBC
Iron: 58 ug/dL (ref 28–170)
Saturation Ratios: 19 % (ref 10.4–31.8)
TIBC: 308 ug/dL (ref 250–450)
UIBC: 250 ug/dL

## 2021-06-22 LAB — PREPARE RBC (CROSSMATCH)

## 2021-06-22 LAB — GLUCOSE, CAPILLARY: Glucose-Capillary: 136 mg/dL — ABNORMAL HIGH (ref 70–99)

## 2021-06-22 LAB — SURGICAL PATHOLOGY

## 2021-06-22 LAB — TRANSFERRIN: Transferrin: 217 mg/dL (ref 192–382)

## 2021-06-22 LAB — FERRITIN: Ferritin: 23 ng/mL (ref 11–307)

## 2021-06-22 MED ORDER — SODIUM CHLORIDE 0.9 % IV SOLN
250.0000 mg | Freq: Once | INTRAVENOUS | Status: AC
  Administered 2021-06-22: 250 mg via INTRAVENOUS
  Filled 2021-06-22: qty 20

## 2021-06-22 MED ORDER — MELATONIN 3 MG PO TABS
3.0000 mg | ORAL_TABLET | Freq: Once | ORAL | Status: AC
  Administered 2021-06-22: 3 mg via ORAL
  Filled 2021-06-22: qty 1

## 2021-06-22 MED ORDER — SODIUM CHLORIDE 0.9% IV SOLUTION: Freq: Once | INTRAVENOUS | Status: AC

## 2021-06-22 NOTE — Progress Notes (Signed)
[facility] Progress Note  CC:  Anemia, diarrhea, black stools, gastric ulcers  Subjective:  Feels great.  Only had two little small dark colored pieces of a BM today.    Objective:  Vital signs in last 24 hours: Temp:  [97.5 F (36.4 C)-98.6 F (37 C)] 98.6 F (37 C) (07/21 0428) Pulse Rate:  [58-67] 58 (07/21 0428) Resp:  [11-19] 18 (07/21 0428) BP: (104-131)/(50-64) 108/54 (07/21 0428) SpO2:  [93 %-100 %] 95 % (07/21 0754) Weight:  [59.8 kg] 59.8 kg (07/20 1151) Last BM Date: 06/20/21 General:  Alert, Well-developed, in NAD Heart:  Regular rate and rhythm; no murmurs Pulm:  CTAB.  No W/R/R. Abdomen:  Soft, non-distended.  BS present.  Non-tender. Extremities:  Without edema. Neurologic:  Alert and oriented x 4;  grossly normal neurologically. Psych:  Alert and cooperative. Normal mood and affect.  Intake/Output from previous day: 07/20 0701 - 07/21 0700 In: 470.5 [I.V.:470.5] Out: -   Lab Results: Recent Labs    06/21/21 0026 06/21/21 0558 06/21/21 1415 06/22/21 0528  WBC 8.5 5.9 4.5  --   HGB 8.6* 7.9* 7.9* 7.2*  HCT 27.1* 24.6* 25.6* 23.6*  PLT 219 185 195  --    BMET Recent Labs    06/20/21 1601 06/21/21 0026  NA 138 139  K 3.3* 3.6  CL 98 102  CO2 29 29  GLUCOSE 164* 101*  BUN 39* 28*  CREATININE 0.96 0.82  CALCIUM 8.7* 8.4*   LFT Recent Labs    06/21/21 0026  PROT 5.3*  ALBUMIN 3.3*  AST 20  ALT 18  ALKPHOS 49  BILITOT 0.6   PT/INR Recent Labs    06/20/21 1603  LABPROT 13.5  INR 1.0   CT ABDOMEN PELVIS W CONTRAST  Result Date: 06/20/2021 CLINICAL DATA:  GI bleed. Epigastric pain. Concern for C diff colitis given recent antibiotics versus less likely a stomach ulcer. EXAM: CT ABDOMEN AND PELVIS WITH CONTRAST TECHNIQUE: Multidetector CT imaging of the abdomen and pelvis was performed using the standard protocol following bolus administration of intravenous contrast. CONTRAST:  65mL OMNIPAQUE IOHEXOL 350 MG/ML SOLN  COMPARISON:  05/17/2014 FINDINGS: Lower chest: Linear atelectasis or scarring in the right lower lobe. 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 No confluent airspace disease or pleural effusion. Hepatobiliary: Scattered low-density lesions throughout the liver are all subcentimeter, similar to prior exam and likely cysts. No suspicious liver lesion. Gallbladder physiologically distended, no calcified stone. No biliary dilatation. Pancreas: No ductal dilatation or inflammation. Spleen: Normal in size without focal abnormality. Adrenals/Urinary Tract: No adrenal nodule. No hydronephrosis. There are bilateral parapelvic cysts. 6 mm nonobstructing stone in the lower left kidney there is homogeneous renal enhancement with symmetric excretion on delayed phase imaging. Partially distended and unremarkable urinary bladder. Stomach/Bowel: Decompressed stomach. Fall there is no small bowel obstruction or inflammation. No small bowel wall thickening. Occasional fecalization of distal small bowel contents. Appendix not visualized, reported history of appendectomy. There is multifocal colonic diverticulosis. Equivocal minor wall thickening of the sigmoid colon, for example series 2, image 50, but no focal diverticulitis or pericolonic inflammation. No liquid stool in the colon. Small volume of formed stool. Vascular/Lymphatic: Aortic atherosclerosis. No aortic aneurysm. Patent portal and mesenteric veins. No portal venous or mesenteric gas. No abdominopelvic adenopathy. Reproductive: Uterus is not visualized, presumably surgically absent. No adnexal mass Other: No ascites. No free air. No inflammatory change in the abdomen or pelvis. Abdominal wall hernia. Musculoskeletal: There  are no acute or suspicious osseous abnormalities. Chronic appearing anterior wedging of L1 vertebral body. L5-S1 degenerative disc disease. IMPRESSION: 1. Equivocal minor wall thickening of the sigmoid colon in the region of multiple colonic  diverticula. This may represent minimal colitis, but no focally inflamed diverticulum to suggest diverticulitis. No CT findings to suggest C diff colitis. 2. No other acute abnormality in the abdomen/pelvis. 3. Nonobstructing left renal stone. Bilateral parapelvic cysts. Aortic Atherosclerosis (ICD10-I70.0). Electronically Signed   By: Keith Rake M.D.   On: 06/20/2021 18:09    Assessment / Plan: *Black/heme positive stools and one episode of black emesis with drop in Hgb to 7.9 grams this AM as compared to 13.6 grams but that was 7 years ago.  BUN was elevated.  Was on clindamycin for 4 days for a sinus infection when these symptoms started.  EGD 7/20 showed low risk gastric ulcers, pathology pending.  Hgb down to 7.2 grams this AM but no sign of active bleeding.  From GI standpoint she could be transfused with a unit of PRBCs and should receive a dose of IV iron then could be discharged, but she would like to stay until tomorrow so as long as she is stable then can be discharged in the morning.  Needs to continue pantoprazole 40 mg BID for one month and then daily thereafter.  We will arrange for OV follow up with [place] at which time she can be scheduled for follow-up EGD.   LOS: 2 days   [name] [name]. Rayburn Mundis  06/22/2021, 9:47 AM

## 2021-06-22 NOTE — Progress Notes (Addendum)
PHARMACY CONSULT NOTE  [name] [name] is a 75 year old female admitted for rectal bleeding and abdominal pain on 06/20/21.  Hemoglobin today 7.2.  GI following and recommends patient receives a blood transfusion and IV iron infusion prior to discharge.  Pharmacy consulted to dose IV iron.  Plan -Ferrlecit 250mg  IV x1   Thank you for allowing pharmacy to be part of this patients care.  Pharmacy will sign off at this time.  Feel free to reach out with any questions or if additional doses needed.  Dimple Nanas, PharmD 06/22/2021 11:18 AM

## 2021-06-22 NOTE — Progress Notes (Addendum)
Syncopal episode x2  Blood transfusion and iron transfusion completed at 1655.   1703 Pt started ambulating in hallway with son. Nurse also in hallway observing when pt stated she started feeling dizziness and pale.   1705 Syncopal episode x2 noted and pt placed on floor gently with nurse yelling for assistance.   43 MD Marcello Moores and MD Arrien along with charge nurse Jarrett Soho and all other staff assisting pt up and in a chair. RRRn Sarah notified.  1710 Vitals obtained and pt remains pale. She's alert and oriented x4. Verbalizes c/o nausea. RRRn Judson Roch has been observing pt and talking with pt gathering more information. See flowsheet.   Per MD Arrien will recheck H&H, telemetry reapplied, and monitor more frequent.

## 2021-06-22 NOTE — Significant Event (Addendum)
Rapid Response Event Note   Reason for Call :  Syncopal episode while ambulating in hallway  Initial Focused Assessment:  Pt pale and diaphoretic resting in bed after syncopal episode during ambulation in hallway. Pt here for GI bleed. Morning Hgb level 7.2. Pt had just finished receiving IV iron infusion. Pt states funny feeling began in her hands and feet while iron was infusing. Pt is alert and oriented complaining of anxiety and nausea. MD at bedside.     Interventions:  Telemetry reapplied and H&H ordered. Reassurance provided to patient and family.  Plan of Care:  Pt remains in 1507. Telemetry monitoring reapplied. H&H check with plans to transfuse PRBC if low per Arrien, MD   Event Summary:   MD Notified: Cathlean Sauer, MD Call Time: [place] Time: [place] End Time: [place], RN

## 2021-06-22 NOTE — Progress Notes (Addendum)
PROGRESS NOTE    [name] [name]  LFY:[id number] DOB: 17-Apr-1946 DOA: 06/20/2021 PCP: Cari Caraway, MD    Brief Narrative:  [name] [name] was admitted to the hospital with the working diagnosis of gastrointestinal bleeding due to gastric ulcers complicated with acute blood loss anemia.    75 year old female past medical history for diverticular disease, coronary artery disease, anxiety disorder, asthma, hypertension, and breast cancer who presented with rectal bleeding and abdominal pain. Patient reported 10 episodes of dark stools on the day of hospitalization.  She has been recently on clindamycin for sinus infection.  Positive abdominal pain.  On her initial physical examination temperature 98.4, blood pressure 140/84, heart rate 79, respiratory rate 16, oxygen saturation 96%.  Her lungs were clear to auscultation bilaterally, heart S1-S2, present, rhythmic, abdomen with tenderness in the epigastric region, no lower extremity edema.   Sodium 138, potassium 3.3, chloride 98, bicarb 29, glucose 164, BUN 39, creatinine 0.96, AST 24, ALT 19, white count 12.0, hemoglobin 10.4, hematocrit 32.3, platelets 250. SARS COVID-19 negative.   Urinalysis specific gravity 1.014.   CT of the abdomen pelvis with minor wall thickening of the sigmoid colon in the region of multiple colonic diverticuli.  May represent minimal colitis.   Patient underwent upper endoscopy showing nonbleeding gastric ulcers with a clean ulcer base.  Forrest class III.   Assessment & Plan:   Principal Problem:   Upper GI bleed Active Problems:   Essential hypertension   CAD (coronary artery disease)   Asthma   Diverticulosis of colon   Hyperlipidemia   Hypokalemia   Anemia   Acute gastric ulcer with hemorrhage    Acute blood loss anemia due to upper gastrointestinal bleed due to gastric ulcers.  Patient was admitted to the medical ward, she was placed on the remote telemetry monitor, received supportive medical  therapy with intravenous fluids and intravenous pantoprazole. Patient underwent upper endoscopy showing nonbleeding gastric ulcers with a clean ulcer base, Forrest class III.   Her diet was advanced with good toleration, she was transitioned to oral pantoprazole.  Her Hb is 7,2 this am, patient with fatigue and dyspnea with exertion, symptomatic anemia.  Plan to transfuse 1 unite PRBC today and follow up on iron levels. Check H&H in am and possible dc home on 06/23/21 No further diarrhea, discontinue enteric precautions.  Discontinue telemetry.    2.  Hypertension/dyslipidemia.  Patient was placed on nebivolol for blood pressure control. Continue holding irbesartan and hydrochlorothiazide.  Continue with rosuvastatin.    3.  Asthma.  No signs of acute exacerbation, continue budesonide.   4.  Hypokalemia.  Patient received potassium chloride for potassium correction.    Status is: Inpatient  Remains inpatient appropriate because:Inpatient level of care appropriate due to severity of illness  Dispo: The patient is from: Home              Anticipated d/c is to: Home              Patient currently is not medically stable to d/c.   Difficult to place patient No    DVT prophylaxis: Scd   Code Status:   full  Family Communication:  No family at the bedside     Consultants:  GI   Procedures:   Endoscopy      Subjective: Patient is feeling better, no further diarrhea or melena. Feels fatigued and dyspnea on exertion.   Objective: Vitals:   06/21/21 1950 06/21/21 2036 06/22/21 0428 06/22/21 0754  BP:  Marland Kitchen)  131/53 (!) 108/54   Pulse:  64 (!) 58   Resp:  18 18   Temp:  97.9 F (36.6 C) 98.6 F (37 C)   TempSrc:  Oral Oral   SpO2: 93% 95% 94% 95%  Weight:      Height:        Intake/Output Summary (Last 24 hours) at 06/22/2021 1610 Last data filed at 06/22/2021 0600 Gross per 24 hour  Intake 470.47 ml  Output --  Net 470.47 ml   Filed Weights   06/20/21 1515  06/21/21 1151  Weight: 59.9 kg 59.8 kg    Examination:   General: Not in pain  Neurology: Awake and alert, non focal  E ENT: Positive pallor, no icterus, oral mucosa moist Cardiovascular: No JVD. S1-S2 present, rhythmic, no gallops, rubs, or murmurs. No lower extremity edema. Pulmonary: positive breath sounds bilaterally, adequate air movement, no wheezing, rhonchi or rales. Gastrointestinal. Abdomen soft and non tender Skin. No rashes Musculoskeletal: no joint deformities     Data Reviewed: I have personally reviewed following labs and imaging studies  CBC: Recent Labs  Lab 06/20/21 1601 06/21/21 0026 06/21/21 0558 06/21/21 1415 06/22/21 0528  WBC 12.0* 8.5 5.9 4.5  --   HGB 10.4* 8.6* 7.9* 7.9* 7.2*  HCT 32.3* 27.1* 24.6* 25.6* 23.6*  MCV 99.7 100.7* 100.8* 103.2*  --   PLT 250 219 185 195  --    Basic Metabolic Panel: Recent Labs  Lab 06/20/21 1601 06/21/21 0026  NA 138 139  K 3.3* 3.6  CL 98 102  CO2 29 29  GLUCOSE 164* 101*  BUN 39* 28*  CREATININE 0.96 0.82  CALCIUM 8.7* 8.4*   GFR: Estimated Creatinine Clearance: 48.5 mL/min (by C-G formula based on SCr of 0.82 mg/dL). Liver Function Tests: Recent Labs  Lab 06/20/21 1601 06/21/21 0026  AST 24 20  ALT 19 18  ALKPHOS 60 49  BILITOT 0.6 0.6  PROT 6.6 5.3*  ALBUMIN 3.9 3.3*   Recent Labs  Lab 06/20/21 1601  LIPASE 26   No results for input(s): AMMONIA in the last 168 hours. Coagulation Profile: Recent Labs  Lab 06/20/21 1603  INR 1.0   Cardiac Enzymes: No results for input(s): CKTOTAL, CKMB, CKMBINDEX, TROPONINI in the last 168 hours. BNP (last 3 results) No results for input(s): PROBNP in the last 8760 hours. HbA1C: No results for input(s): HGBA1C in the last 72 hours. CBG: No results for input(s): GLUCAP in the last 168 hours. Lipid Profile: No results for input(s): CHOL, HDL, LDLCALC, TRIG, CHOLHDL, LDLDIRECT in the last 72 hours. Thyroid Function Tests: No results for  input(s): TSH, T4TOTAL, FREET4, T3FREE, THYROIDAB in the last 72 hours. Anemia Panel: No results for input(s): VITAMINB12, FOLATE, FERRITIN, TIBC, IRON, RETICCTPCT in the last 72 hours.    Radiology Studies: I have reviewed all of the imaging during this hospital visit personally     Scheduled Meds:  sodium chloride   Intravenous Once   budesonide  0.5 mg Nebulization BID   conjugated estrogens  0.5 Applicatorful Vaginal Once per day on Mon Thu   escitalopram  10 mg Oral Daily   fluticasone  1 spray Each Nare BID   nebivolol  2.5 mg Oral Daily   pantoprazole  40 mg Oral BID   potassium chloride SA  20 mEq Oral BID   rosuvastatin  5 mg Oral Daily   Continuous Infusions:  lactated ringers 10 mL/hr at 06/22/21 0143     LOS: 2 days  Sylvester Salonga Gerome Apley, MD

## 2021-06-22 NOTE — Progress Notes (Signed)
Patient had a syncope episode in the hallway while ambulating after receiving IV iron. Patient very pale.  Return to her bed, systolic blood pressure 438 mmHg, with HR 52  Plan to check a stat H&H May need PRBC transfusion.  Resume telemetry monitoring for now.  I spoke with her son at the bedside and all questions were addressed.

## 2021-06-23 DIAGNOSIS — K922 Gastrointestinal hemorrhage, unspecified: Secondary | ICD-10-CM | POA: Diagnosis not present

## 2021-06-23 LAB — TYPE AND SCREEN
ABO/RH(D): A POS
Antibody Screen: NEGATIVE
Unit division: 0

## 2021-06-23 LAB — BPAM RBC
Blood Product Expiration Date: 202208102359
ISSUE DATE / TIME: 202207211206
Unit Type and Rh: 6200

## 2021-06-23 LAB — BASIC METABOLIC PANEL
Anion gap: 4 — ABNORMAL LOW (ref 5–15)
BUN: 15 mg/dL (ref 8–23)
CO2: 27 mmol/L (ref 22–32)
Calcium: 7.9 mg/dL — ABNORMAL LOW (ref 8.9–10.3)
Chloride: 111 mmol/L (ref 98–111)
Creatinine, Ser: 0.83 mg/dL (ref 0.44–1.00)
GFR, Estimated: >60 mL/min (ref >60)
Glucose, Bld: 119 mg/dL — ABNORMAL HIGH (ref 70–99)
Potassium: 3.9 mmol/L (ref 3.5–5.1)
Sodium: 142 mmol/L (ref 135–145)

## 2021-06-23 LAB — HEMOGLOBIN AND HEMATOCRIT, BLOOD
HCT: 30.9 % — ABNORMAL LOW (ref 36.0–46.0)
Hemoglobin: 10.1 g/dL — ABNORMAL LOW (ref 12.0–15.0)

## 2021-06-23 NOTE — Progress Notes (Signed)
PROGRESS NOTE    [name] [name]  [id number] DOB: Aug 13, 1946 DOA: 06/20/2021 PCP: Cari Caraway, MD   Brief Narrative: 75 years old female with PMH significant for diverticular disease, coronary artery disease, anxiety disorder, asthma, hypertension, breast cancer presented in the ED with rectal bleeding and abdominal pain.  Patient reports having 10 episodes of dark stool on the day of hospitalization.  She has been recently on clindamycin for sinus infection.  CT abdomen and pelvis minor wall thickening of sigmoid colon in the region of multiple colonic diverticula.   Patient was admitted for gastrointestinal bleeding due to gastric ulcer.  Patient underwent upper GI endoscopy found to have nonbleeding gastric ulcers with a clean ulcer base. Patient was cleared for discharge and patient syncopized while ambulating in the hallway. Discharge held.  Assessment & Plan:   Principal Problem:   Upper GI bleed Active Problems:   Essential hypertension   CAD (coronary artery disease)   Asthma   Diverticulosis of colon   Hyperlipidemia   Hypokalemia   Anemia   Acute gastric ulcer with hemorrhage  Acute blood loss anemia sec. to upper GI bleed due to gastric ulcer: Patient presented with multiple episodes of rectal bleeding and abdominal pain. She is found to have hemoglobin 7.9 > 7.2 associated with fatigue and dyspnea on exertion. Patient continued on intravenous pantoprazole, GI consulted. Patient underwent EGD found to have nonbleeding gastric ulcers with a clean ulcer base.   Patient diet was advanced with good toleration.  She was transitioned to oral Pantoprazole.   Patient has received 1 PRBC posttransfusion hemoglobin 11.5>  10.2.  Patient denies any further diarrhea.  Hypertension:  Continue nebivolol.  Hold irbesartan and hydrochlorothiazide.  Hyperlipidemia:  Continue rosuvastatin.    Asthma : No signs of acute exacerbation,  continue budesonide.  Hypokalemia:  replaced, continue to monitor.  Syncope:  Patient had a syncopal episode in the hallway yesterday while ambulating after getting IV iron. Vitals were stable.  Patient feels better, continued on telemetry.   DVT prophylaxis: SCDs Code Status: Full code. Family Communication: No family at bed side. Disposition Plan:    Status is: Inpatient  Remains inpatient appropriate because:Inpatient level of care appropriate due to severity of illness  Dispo: The patient is from: Home              Anticipated d/c is to: Home              Patient currently is not medically stable to d/c.   Difficult to place patient No  Consultants:  GI  Procedures: EGD Antimicrobials:  Anti-infectives (From admission, onward)    None        Subjective: Patient was seen and examined at bedside.  She reports feeling better , still feels weak and tired, She denies nausea ,vomiting and diarrhea.  She is sitting comfortably on the chair ,having breakfast.  Objective: Vitals:   06/22/21 1959 06/22/21 2045 06/23/21 0503 06/23/21 0738  BP:  (!) 144/62 129/62   Pulse:  (!) 57 (!) 55   Resp:  18 18   Temp:  97.9 F (36.6 C) 98.2 F (36.8 C)   TempSrc:  Oral Oral   SpO2: 94% 93% 94% 96%  Weight:      Height:        Intake/Output Summary (Last 24 hours) at 06/23/2021 1158 Last data filed at 06/23/2021 1131 Gross per 24 hour  Intake 428.13 ml  Output 300 ml  Net 128.13 ml  Filed Weights   06/20/21 1515 06/21/21 1151  Weight: 59.9 kg 59.8 kg    Examination:  General exam: Appears calm and comfortable , not in any acute distress. Respiratory system: Clear to auscultation. Respiratory effort normal. Cardiovascular system: S1 & S2 heard, RRR. No JVD, murmurs, rubs, gallops or clicks. No pedal edema. Gastrointestinal system: Abdomen is nondistended, soft and nontender. No organomegaly or masses felt. Normal bowel sounds heard. Central nervous system: Alert and oriented. No focal neurological  deficits. Extremities: Symmetric 5 x 5 power.  No edema, no cyanosis, no clubbing. Skin: No rashes, lesions or ulcers Psychiatry: Judgement and insight appear normal. Mood & affect appropriate.     Data Reviewed: I have personally reviewed following labs and imaging studies  CBC: Recent Labs  Lab 06/20/21 1601 06/21/21 0026 06/21/21 0558 06/21/21 1415 06/22/21 0528 06/22/21 1912 06/23/21 0451  WBC 12.0* 8.5 5.9 4.5  --   --   --   HGB 10.4* 8.6* 7.9* 7.9* 7.2* 11.5* 10.1*  HCT 32.3* 27.1* 24.6* 25.6* 23.6* 35.5* 30.9*  MCV 99.7 100.7* 100.8* 103.2*  --   --   --   PLT 250 219 185 195  --   --   --    Basic Metabolic Panel: Recent Labs  Lab 06/20/21 1601 06/21/21 0026 06/23/21 0451  NA 138 139 142  K 3.3* 3.6 3.9  CL 98 102 111  CO2 '29 29 27  '$ GLUCOSE 164* 101* 119*  BUN 39* 28* 15  CREATININE 0.96 0.82 0.83  CALCIUM 8.7* 8.4* 7.9*   GFR: Estimated Creatinine Clearance: 47.9 mL/min (by C-G formula based on SCr of 0.83 mg/dL). Liver Function Tests: Recent Labs  Lab 06/20/21 1601 06/21/21 0026  AST 24 20  ALT 19 18  ALKPHOS 60 49  BILITOT 0.6 0.6  PROT 6.6 5.3*  ALBUMIN 3.9 3.3*   Recent Labs  Lab 06/20/21 1601  LIPASE 26   No results for input(s): AMMONIA in the last 168 hours. Coagulation Profile: Recent Labs  Lab 06/20/21 1603  INR 1.0   Cardiac Enzymes: No results for input(s): CKTOTAL, CKMB, CKMBINDEX, TROPONINI in the last 168 hours. BNP (last 3 results) No results for input(s): PROBNP in the last 8760 hours. HbA1C: No results for input(s): HGBA1C in the last 72 hours. CBG: Recent Labs  Lab 06/22/21 1714  GLUCAP 136*   Lipid Profile: No results for input(s): CHOL, HDL, LDLCALC, TRIG, CHOLHDL, LDLDIRECT in the last 72 hours. Thyroid Function Tests: No results for input(s): TSH, T4TOTAL, FREET4, T3FREE, THYROIDAB in the last 72 hours. Anemia Panel: Recent Labs    06/22/21 0948  FERRITIN 23  TIBC 308  IRON 58   Sepsis Labs: No  results for input(s): PROCALCITON, LATICACIDVEN in the last 168 hours.  Recent Results (from the past 240 hour(s))  Resp Panel by RT-PCR (Flu A&B, Covid) Nasopharyngeal Swab     Status: None   Collection Time: 06/20/21  8:36 PM   Specimen: Nasopharyngeal Swab; Nasopharyngeal(NP) swabs in vial transport medium  Result Value Ref Range Status   SARS Coronavirus 2 by RT PCR NEGATIVE NEGATIVE Final    Comment: (NOTE) SARS-CoV-2 target nucleic acids are NOT DETECTED.  The SARS-CoV-2 RNA is generally detectable in upper respiratory specimens during the acute phase of infection. The lowest concentration of SARS-CoV-2 viral copies this assay can detect is 138 copies/mL. A negative result does not preclude SARS-Cov-2 infection and should not be used as the sole basis for treatment or other patient management  decisions. A negative result may occur with  improper specimen collection/handling, submission of specimen other than nasopharyngeal swab, presence of viral mutation(s) within the areas targeted by this assay, and inadequate number of viral copies(<138 copies/mL). A negative result must be combined with clinical observations, patient history, and epidemiological information. The expected result is Negative.  Fact Sheet for Patients:  EntrepreneurPulse.com.au  Fact Sheet for Healthcare Providers:  IncredibleEmployment.be  This test is no t yet approved or cleared by the [place] FDA and  has been authorized for detection and/or diagnosis of SARS-CoV-2 by FDA under an Emergency Use Authorization (EUA). This EUA will remain  in effect (meaning this test can be used) for the duration of the COVID-19 declaration under Section 564(b)(1) of the Act, 21 U.S.C.section 360bbb-3(b)(1), unless the authorization is terminated  or revoked sooner.       Influenza A by PCR NEGATIVE NEGATIVE Final   Influenza B by PCR NEGATIVE NEGATIVE Final    Comment:  (NOTE) The Xpert Xpress SARS-CoV-2/FLU/RSV plus assay is intended as an aid in the diagnosis of influenza from Nasopharyngeal swab specimens and should not be used as a sole basis for treatment. Nasal washings and aspirates are unacceptable for Xpert Xpress SARS-CoV-2/FLU/RSV testing.  Fact Sheet for Patients: EntrepreneurPulse.com.au  Fact Sheet for Healthcare Providers: IncredibleEmployment.be  This test is not yet approved or cleared by the [place] FDA and has been authorized for detection and/or diagnosis of SARS-CoV-2 by FDA under an Emergency Use Authorization (EUA). This EUA will remain in effect (meaning this test can be used) for the duration of the COVID-19 declaration under Section 564(b)(1) of the Act, 21 U.S.C. section 360bbb-3(b)(1), unless the authorization is terminated or revoked.  Performed at [facility], [place] [address], [place], [place] [address]      Radiology Studies: No results found.  Scheduled Meds:  budesonide  0.5 mg Nebulization BID   conjugated estrogens  0.5 Applicatorful Vaginal Once per day on Mon Thu   escitalopram  10 mg Oral Daily   fluticasone  1 spray Each Nare BID   nebivolol  2.5 mg Oral Daily   pantoprazole  40 mg Oral BID   rosuvastatin  5 mg Oral Daily   Continuous Infusions:  lactated ringers 10 mL/hr at 06/22/21 0143     LOS: 3 days    Time spent: Justice, MD [facility]   If 7PM-7AM, please contact night-coverage

## 2021-06-23 NOTE — Care Management Important Message (Signed)
Important Message  Patient Details IM Letter given to the Patient.  Name: [name] [name] MRN: [id number] Date of Birth: 1946-01-26   Medicare Important Message Given:  Yes     Kerin Salen 06/23/2021, 11:53 AM

## 2021-06-24 DIAGNOSIS — K922 Gastrointestinal hemorrhage, unspecified: Secondary | ICD-10-CM | POA: Diagnosis not present

## 2021-06-24 LAB — BASIC METABOLIC PANEL
Anion gap: 5 (ref 5–15)
BUN: 14 mg/dL (ref 8–23)
CO2: 26 mmol/L (ref 22–32)
Calcium: 7.9 mg/dL — ABNORMAL LOW (ref 8.9–10.3)
Chloride: 108 mmol/L (ref 98–111)
Creatinine, Ser: 0.74 mg/dL (ref 0.44–1.00)
GFR, Estimated: >60 mL/min (ref >60)
Glucose, Bld: 99 mg/dL (ref 70–99)
Potassium: 3.8 mmol/L (ref 3.5–5.1)
Sodium: 139 mmol/L (ref 135–145)

## 2021-06-24 LAB — CBC
HCT: 28.7 % — ABNORMAL LOW (ref 36.0–46.0)
Hemoglobin: 8.9 g/dL — ABNORMAL LOW (ref 12.0–15.0)
MCH: 31.4 pg (ref 26.0–34.0)
MCHC: 31 g/dL (ref 30.0–36.0)
MCV: 101.4 fL — ABNORMAL HIGH (ref 80.0–100.0)
Platelets: 167 10*3/uL (ref 150–400)
RBC: 2.83 MIL/uL — ABNORMAL LOW (ref 3.87–5.11)
RDW: 15.3 % (ref 11.5–15.5)
WBC: 6.3 10*3/uL (ref 4.0–10.5)
nRBC: 0 % (ref 0.0–0.2)

## 2021-06-24 LAB — PHOSPHORUS: Phosphorus: 3.3 mg/dL (ref 2.5–4.6)

## 2021-06-24 LAB — MAGNESIUM: Magnesium: 1.9 mg/dL (ref 1.7–2.4)

## 2021-06-24 MED ORDER — PANTOPRAZOLE SODIUM 40 MG PO TBEC: 40.0000 mg | DELAYED_RELEASE_TABLET | Freq: Every day | ORAL | 0 refills | Status: DC

## 2021-06-24 NOTE — Progress Notes (Signed)
PT Cancellation Note  Patient Details Name: [name] [name] MRN: [id number] DOB: 1946-08-13   Cancelled Treatment:    Reason Eval/Treat Not Completed: PT screened, no needs identified, will sign off  Pt is dressed walking around in room and reports d/c home today.  Pt had d/c packet and on phone with her ride.   Pt does not appear to have any acute PT needs at this time.   Dashia Caldeira,KATHrine E 06/24/2021, 10:43 AM Jannette Spanner PT, DPT Acute Rehabilitation Services Pager: [phone] Office: [phone]

## 2021-06-24 NOTE — Discharge Summary (Signed)
Physician Discharge Summary  [name] [name] [id number] DOB: [birth year] DOA: 06/20/2021  PCP: Cari Caraway, MD  Admit date: 06/20/2021  Discharge date: 06/24/2021  Admitted From: Home.  Disposition:  Home  Recommendations for Outpatient Follow-up:  Follow up with PCP in 1-2 weeks. Please obtain BMP/CBC in one week. 3.   Advised to follow up GI in one week.  Home Health: None Equipment/Devices:None  Discharge Condition: Good CODE STATUS:Full code Diet recommendation: Heart Healthy   Brief [facility] course: This 75 years old female with PMH significant for diverticular disease, coronary artery disease, anxiety disorder, asthma, hypertension, breast cancer presented in the ED with rectal bleeding and abdominal pain.  Patient reports having 10 episodes of dark stool on the day of hospitalization. She has been recently prescribed clindamycin for sinus infection.  CT abdomen and pelvis showed minor wall thickening of sigmoid colon in the region of multiple colonic diverticula.  Her hemoglobin was 7.9 at admission. Patient was admitted for gastrointestinal bleeding possibly due to gastric ulcer. GI was consulted.  Patient underwent upper GI endoscopy ,found to have nonbleeding gastric ulcers with a clean ulcer base.  GI signed off.  Advised pantoprazole 40 mg daily.  Patient was cleared for discharge but patient syncopized while ambulating in the hallway.  Discharge was held.  Patient was observed for 24 hours.  She was continued on telemetry,  no arrhythmias were noted.  Patient felt better and she has ambulated in the hallway without any symptoms.  Patient is being discharged home and she will follow-up with primary care doctor and GI in 1 week.  She also has appointment with cardiologist in 1 week.  She was managed for below problems.  Discharge Diagnoses:  Principal Problem:   Upper GI bleed Active Problems:   Essential hypertension   CAD (coronary artery disease)    Asthma   Diverticulosis of colon   Hyperlipidemia   Hypokalemia   Anemia   Acute gastric ulcer with hemorrhage  Acute blood loss anemia sec. to upper GI bleed due to gastric ulcer: Patient presented with multiple episodes of rectal bleeding and abdominal pain. She is found to have hemoglobin 7.9 > 7.2 associated with fatigue and dyspnea on exertion. Patient continued on intravenous pantoprazole, GI consulted. Patient underwent EGD found to have nonbleeding gastric ulcers with a clean ulcer base.   Patient diet was advanced with good toleration.  She was transitioned to oral Pantoprazole.   Patient has received 1 PRBC posttransfusion hemoglobin 11.5>  10.2.  Patient denies any further diarrhea.   Hypertension:  Continue nebivolol.   resume irbesartan and hydrochlorothiazide at discharge.   Hyperlipidemia:  Continue rosuvastatin.     Asthma : No signs of acute exacerbation,  continue budesonide.  Hypokalemia: replaced, continue to monitor.   Syncope:  Patient had a syncopal episode in the hallway while ambulating after getting IV iron. Vitals were stable.  Patient felt better afterwards, continued on telemetry. No arrhythmias noted.  Could be secondary to IV iron infusion. Patient felt better and being discharged.    Discharge Instructions  Discharge Instructions     Call MD for:  difficulty breathing, headache or visual disturbances   Complete by: As directed    Call MD for:  persistant dizziness or light-headedness   Complete by: As directed    Call MD for:  persistant nausea and vomiting   Complete by: As directed    Diet - low sodium heart healthy   Complete by: As directed  Diet Carb Modified   Complete by: As directed    Discharge instructions   Complete by: As directed    Advised to follow up PCP in one week. Advised to follow up GI in one week.   Increase activity slowly   Complete by: As directed       Allergies as of 06/24/2021       Reactions    Clindamycin Diarrhea, Nausea And Vomiting   Blood in diarrhea   Codeine Other (See Comments)   headache   Lisinopril Cough   Simvastatin Diarrhea        Medication List     STOP taking these medications    sulfamethoxazole-trimethoprim 400-80 MG tablet Commonly known as: BACTRIM       TAKE these medications    acetaminophen 325 MG tablet Commonly known as: TYLENOL Take 650 mg by mouth every 6 (six) hours as needed for mild pain.   albuterol 108 (90 Base) MCG/ACT inhaler Commonly known as: VENTOLIN HFA Inhale 2 puffs into the lungs every 4 (four) hours as needed for shortness of breath.   aspirin 81 MG tablet Take 81 mg by mouth daily.   denosumab 60 MG/ML Sosy injection Commonly known as: PROLIA Inject 60 mg into the skin every 6 (six) months.   escitalopram 10 MG tablet Commonly known as: LEXAPRO Take 10 mg by mouth daily.   Flovent HFA 220 MCG/ACT inhaler Generic drug: fluticasone Inhale 1 puff into the lungs 2 (two) times daily.   fluticasone 50 MCG/ACT nasal spray Commonly known as: FLONASE Place 1 spray into both nostrils 2 (two) times daily.   hydrochlorothiazide 25 MG tablet Commonly known as: HYDRODIURIL Take 1 tablet (25 mg total) by mouth daily.   irbesartan 150 MG tablet Commonly known as: AVAPRO Take 150 mg by mouth daily.   ketotifen 0.025 % ophthalmic solution Commonly known as: ZADITOR Place 1 drop into both eyes 2 (two) times daily as needed (allergies).   nebivolol 2.5 MG tablet Commonly known as: Bystolic Take 1 tablet (2.5 mg total) by mouth daily.   pantoprazole 40 MG tablet Commonly known as: PROTONIX Take 1 tablet (40 mg total) by mouth daily.   polyvinyl alcohol 1.4 % ophthalmic solution Commonly known as: LIQUIFILM TEARS Place 1 drop into both eyes as needed for dry eyes.   Potassium Chloride ER 20 MEQ Tbcr Take 20 mEq by mouth 2 (two) times daily.   Premarin vaginal cream Generic drug: conjugated estrogens Place  0.5 Applicatorfuls vaginally 2 (two) times a week. On Tuesday and Saturday     Use as directed   rosuvastatin 5 MG tablet Commonly known as: CRESTOR Take 1 tablet (5 mg total) by mouth daily.        Follow-up Information     Zehr, Laban Emperor, PA-C Follow up on 07/20/2021.   Specialty: Gastroenterology Why: 1:30pm Contact information: [place] [address] [phone]         Cari Caraway, MD Follow up in 1 week(s).   Specialty: Family Medicine Contact information: [place] [address] [phone]         Nahser, Wonda Cheng, MD .   Specialty: Cardiology Contact information: [place] [address] [place] [address] [phone]                Allergies  Allergen Reactions   Clindamycin Diarrhea and Nausea And Vomiting    Blood in diarrhea   Codeine Other (See  Comments)    headache   Lisinopril Cough   Simvastatin Diarrhea    Consultations: GI   Procedures/Studies: CT ABDOMEN PELVIS W CONTRAST  Result Date: 06/20/2021 CLINICAL DATA:  GI bleed. Epigastric pain. Concern for C diff colitis given recent antibiotics versus less likely a stomach ulcer. EXAM: CT ABDOMEN AND PELVIS WITH CONTRAST TECHNIQUE: Multidetector CT imaging of the abdomen and pelvis was performed using the standard protocol following bolus administration of intravenous contrast. CONTRAST:  48m OMNIPAQUE IOHEXOL 350 MG/ML SOLN COMPARISON:  05/17/2014 FINDINGS: Lower chest: Linear atelectasis or scarring in the right lower lobe. 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 No confluent airspace disease or pleural effusion. Hepatobiliary: Scattered low-density lesions throughout the liver are all subcentimeter, similar to prior exam and likely cysts. No suspicious liver lesion. Gallbladder physiologically distended, no calcified stone. No biliary dilatation. Pancreas: No ductal dilatation or inflammation. Spleen: Normal in size without focal  abnormality. Adrenals/Urinary Tract: No adrenal nodule. No hydronephrosis. There are bilateral parapelvic cysts. 6 mm nonobstructing stone in the lower left kidney there is homogeneous renal enhancement with symmetric excretion on delayed phase imaging. Partially distended and unremarkable urinary bladder. Stomach/Bowel: Decompressed stomach. Fall there is no small bowel obstruction or inflammation. No small bowel wall thickening. Occasional fecalization of distal small bowel contents. Appendix not visualized, reported history of appendectomy. There is multifocal colonic diverticulosis. Equivocal minor wall thickening of the sigmoid colon, for example series 2, image 50, but no focal diverticulitis or pericolonic inflammation. No liquid stool in the colon. Small volume of formed stool. Vascular/Lymphatic: Aortic atherosclerosis. No aortic aneurysm. Patent portal and mesenteric veins. No portal venous or mesenteric gas. No abdominopelvic adenopathy. Reproductive: Uterus is not visualized, presumably surgically absent. No adnexal mass Other: No ascites. No free air. No inflammatory change in the abdomen or pelvis. Abdominal wall hernia. Musculoskeletal: There are no acute or suspicious osseous abnormalities. Chronic appearing anterior wedging of L1 vertebral body. L5-S1 degenerative disc disease. IMPRESSION: 1. Equivocal minor wall thickening of the sigmoid colon in the region of multiple colonic diverticula. This may represent minimal colitis, but no focally inflamed diverticulum to suggest diverticulitis. No CT findings to suggest C diff colitis. 2. No other acute abnormality in the abdomen/pelvis. 3. Nonobstructing left renal stone. Bilateral parapelvic cysts. Aortic Atherosclerosis (ICD10-I70.0). Electronically Signed   By: MKeith RakeM.D.   On: 06/20/2021 18:09    EGD.  Subjective: Patient was seen and examined at bedside.  Overnight events noted.  Patient felt better,  Patient has ambulated in the  hallway without any symptoms. She denies any further diarrhea or blood in the stools.  She wants to be discharged.  Discharge Exam: Vitals:   06/24/21 0735 06/24/21 0936  BP:  112/66  Pulse:  (!) 57  Resp:    Temp:    SpO2: 96%    Vitals:   06/23/21 2038 06/24/21 0429 06/24/21 0735 06/24/21 0936  BP: 139/71 119/67  112/66  Pulse: (!) 59 (!) 58  (!) 57  Resp: 16 20    Temp: 98 F (36.7 C) (!) 97.3 F (36.3 C)    TempSrc: Oral Oral    SpO2: 99% 95% 96%   Weight:      Height:        General: Pt is alert, awake, not in acute distress Cardiovascular: RRR, S1/S2 +, no rubs, no gallops Respiratory: CTA bilaterally, no wheezing, no rhonchi Abdominal: Soft, NT, ND, bowel sounds + Extremities: no edema, no cyanosis  The results of significant diagnostics from this hospitalization (including imaging, microbiology, ancillary and laboratory) are listed below for reference.     Microbiology: Recent Results (from the past 240 hour(s))  Resp Panel by RT-PCR (Flu A&B, Covid) Nasopharyngeal Swab     Status: None   Collection Time: 06/20/21  8:36 PM   Specimen: Nasopharyngeal Swab; Nasopharyngeal(NP) swabs in vial transport medium  Result Value Ref Range Status   SARS Coronavirus 2 by RT PCR NEGATIVE NEGATIVE Final    Comment: (NOTE) SARS-CoV-2 target nucleic acids are NOT DETECTED.  The SARS-CoV-2 RNA is generally detectable in upper respiratory specimens during the acute phase of infection. The lowest concentration of SARS-CoV-2 viral copies this assay can detect is 138 copies/mL. A negative result does not preclude SARS-Cov-2 infection and should not be used as the sole basis for treatment or other patient management decisions. A negative result may occur with  improper specimen collection/handling, submission of specimen other than nasopharyngeal swab, presence of viral mutation(s) within the areas targeted by this assay, and inadequate number of viral copies(<138  copies/mL). A negative result must be combined with clinical observations, patient history, and epidemiological information. The expected result is Negative.  Fact Sheet for Patients:  EntrepreneurPulse.com.au  Fact Sheet for Healthcare Providers:  IncredibleEmployment.be  This test is no t yet approved or cleared by the [place] FDA and  has been authorized for detection and/or diagnosis of SARS-CoV-2 by FDA under an Emergency Use Authorization (EUA). This EUA will remain  in effect (meaning this test can be used) for the duration of the COVID-19 declaration under Section 564(b)(1) of the Act, 21 U.S.C.section 360bbb-3(b)(1), unless the authorization is terminated  or revoked sooner.       Influenza A by PCR NEGATIVE NEGATIVE Final   Influenza B by PCR NEGATIVE NEGATIVE Final    Comment: (NOTE) The Xpert Xpress SARS-CoV-2/FLU/RSV plus assay is intended as an aid in the diagnosis of influenza from Nasopharyngeal swab specimens and should not be used as a sole basis for treatment. Nasal washings and aspirates are unacceptable for Xpert Xpress SARS-CoV-2/FLU/RSV testing.  Fact Sheet for Patients: EntrepreneurPulse.com.au  Fact Sheet for Healthcare Providers: IncredibleEmployment.be  This test is not yet approved or cleared by the [place] FDA and has been authorized for detection and/or diagnosis of SARS-CoV-2 by FDA under an Emergency Use Authorization (EUA). This EUA will remain in effect (meaning this test can be used) for the duration of the COVID-19 declaration under Section 564(b)(1) of the Act, 21 U.S.C. section 360bbb-3(b)(1), unless the authorization is terminated or revoked.  Performed at [facility], [place] [address] [place], [place] [address]      Labs: BNP (last 3 results) No results for input(s): BNP in the last 8760 hours. Basic Metabolic Panel: Recent  Labs  Lab 06/20/21 1601 06/21/21 0026 06/23/21 0451 06/24/21 0501  NA 138 139 142 139  K 3.3* 3.6 3.9 3.8  CL 98 102 111 108  CO2 '29 29 27 26  '$ GLUCOSE 164* 101* 119* 99  BUN 39* 28* 15 14  CREATININE 0.96 0.82 0.83 0.74  CALCIUM 8.7* 8.4* 7.9* 7.9*  MG  --   --   --  1.9  PHOS  --   --   --  3.3   Liver Function Tests: Recent Labs  Lab 06/20/21 1601 06/21/21 0026  AST 24 20  ALT 19 18  ALKPHOS 60 49  BILITOT 0.6 0.6  PROT 6.6 5.3*  ALBUMIN 3.9  3.3*   Recent Labs  Lab 06/20/21 1601  LIPASE 26   No results for input(s): AMMONIA in the last 168 hours. CBC: Recent Labs  Lab 06/20/21 1601 06/21/21 0026 06/21/21 0558 06/21/21 1415 06/22/21 0528 06/22/21 1912 06/23/21 0451 06/24/21 0501  WBC 12.0* 8.5 5.9 4.5  --   --   --  6.3  HGB 10.4* 8.6* 7.9* 7.9* 7.2* 11.5* 10.1* 8.9*  HCT 32.3* 27.1* 24.6* 25.6* 23.6* 35.5* 30.9* 28.7*  MCV 99.7 100.7* 100.8* 103.2*  --   --   --  101.4*  PLT 250 219 185 195  --   --   --  167   Cardiac Enzymes: No results for input(s): CKTOTAL, CKMB, CKMBINDEX, TROPONINI in the last 168 hours. BNP: Invalid input(s): POCBNP CBG: Recent Labs  Lab 06/22/21 1714  GLUCAP 136*   D-Dimer No results for input(s): DDIMER in the last 72 hours. Hgb A1c No results for input(s): HGBA1C in the last 72 hours. Lipid Profile No results for input(s): CHOL, HDL, LDLCALC, TRIG, CHOLHDL, LDLDIRECT in the last 72 hours. Thyroid function studies No results for input(s): TSH, T4TOTAL, T3FREE, THYROIDAB in the last 72 hours.  Invalid input(s): FREET3 Anemia work up Recent Labs    06/22/21 0948  FERRITIN 23  TIBC 308  IRON 58   Urinalysis    Component Value Date/Time   COLORURINE STRAW (A) 06/20/2021 1647   APPEARANCEUR CLEAR 06/20/2021 1647   LABSPEC 1.014 06/20/2021 1647   PHURINE 7.0 06/20/2021 1647   GLUCOSEU NEGATIVE 06/20/2021 1647   HGBUR NEGATIVE 06/20/2021 1647   [place] 06/20/2021 1647   KETONESUR NEGATIVE  06/20/2021 1647   PROTEINUR NEGATIVE 06/20/2021 1647   NITRITE NEGATIVE 06/20/2021 1647   LEUKOCYTESUR NEGATIVE 06/20/2021 1647   Sepsis Labs Invalid input(s): PROCALCITONIN,  WBC,  LACTICIDVEN Microbiology Recent Results (from the past 240 hour(s))  Resp Panel by RT-PCR (Flu A&B, Covid) Nasopharyngeal Swab     Status: None   Collection Time: 06/20/21  8:36 PM   Specimen: Nasopharyngeal Swab; Nasopharyngeal(NP) swabs in vial transport medium  Result Value Ref Range Status   SARS Coronavirus 2 by RT PCR NEGATIVE NEGATIVE Final    Comment: (NOTE) SARS-CoV-2 target nucleic acids are NOT DETECTED.  The SARS-CoV-2 RNA is generally detectable in upper respiratory specimens during the acute phase of infection. The lowest concentration of SARS-CoV-2 viral copies this assay can detect is 138 copies/mL. A negative result does not preclude SARS-Cov-2 infection and should not be used as the sole basis for treatment or other patient management decisions. A negative result may occur with  improper specimen collection/handling, submission of specimen other than nasopharyngeal swab, presence of viral mutation(s) within the areas targeted by this assay, and inadequate number of viral copies(<138 copies/mL). A negative result must be combined with clinical observations, patient history, and epidemiological information. The expected result is Negative.  Fact Sheet for Patients:  EntrepreneurPulse.com.au  Fact Sheet for Healthcare Providers:  IncredibleEmployment.be  This test is no t yet approved or cleared by the [place] FDA and  has been authorized for detection and/or diagnosis of SARS-CoV-2 by FDA under an Emergency Use Authorization (EUA). This EUA will remain  in effect (meaning this test can be used) for the duration of the COVID-19 declaration under Section 564(b)(1) of the Act, 21 U.S.C.section 360bbb-3(b)(1), unless the authorization is  terminated  or revoked sooner.       Influenza A by PCR NEGATIVE NEGATIVE Final   Influenza B by  PCR NEGATIVE NEGATIVE Final    Comment: (NOTE) The Xpert Xpress SARS-CoV-2/FLU/RSV plus assay is intended as an aid in the diagnosis of influenza from Nasopharyngeal swab specimens and should not be used as a sole basis for treatment. Nasal washings and aspirates are unacceptable for Xpert Xpress SARS-CoV-2/FLU/RSV testing.  Fact Sheet for Patients: EntrepreneurPulse.com.au  Fact Sheet for Healthcare Providers: IncredibleEmployment.be  This test is not yet approved or cleared by the [place] FDA and has been authorized for detection and/or diagnosis of SARS-CoV-2 by FDA under an Emergency Use Authorization (EUA). This EUA will remain in effect (meaning this test can be used) for the duration of the COVID-19 declaration under Section 564(b)(1) of the Act, 21 U.S.C. section 360bbb-3(b)(1), unless the authorization is terminated or revoked.  Performed at [facility], [place] [address] [place], [place] [address]      Time coordinating discharge: Over 30 minutes  SIGNED:   Shawna Clamp, MD  [facility] 06/24/2021, 11:14 AM Pager   If 7PM-7AM, please contact night-coverage [email or web address]

## 2021-06-24 NOTE — Progress Notes (Signed)
Went over discharge instructions w/ pt. Pt agreed w/ plan.

## 2021-06-24 NOTE — Discharge Instructions (Signed)
Advised to follow up GI in one week.

## 2021-06-27 DIAGNOSIS — K922 Gastrointestinal hemorrhage, unspecified: Secondary | ICD-10-CM | POA: Diagnosis not present

## 2021-06-27 DIAGNOSIS — D649 Anemia, unspecified: Secondary | ICD-10-CM | POA: Diagnosis not present

## 2021-06-27 DIAGNOSIS — R899 Unspecified abnormal finding in specimens from other organs, systems and tissues: Secondary | ICD-10-CM | POA: Diagnosis not present

## 2021-06-27 DIAGNOSIS — I2510 Atherosclerotic heart disease of native coronary artery without angina pectoris: Secondary | ICD-10-CM | POA: Diagnosis not present

## 2021-07-04 DIAGNOSIS — N3020 Other chronic cystitis without hematuria: Secondary | ICD-10-CM | POA: Diagnosis not present

## 2021-07-04 DIAGNOSIS — R350 Frequency of micturition: Secondary | ICD-10-CM | POA: Diagnosis not present

## 2021-07-11 DIAGNOSIS — K922 Gastrointestinal hemorrhage, unspecified: Secondary | ICD-10-CM | POA: Diagnosis not present

## 2021-07-11 DIAGNOSIS — M810 Age-related osteoporosis without current pathological fracture: Secondary | ICD-10-CM | POA: Diagnosis not present

## 2021-07-11 DIAGNOSIS — I2510 Atherosclerotic heart disease of native coronary artery without angina pectoris: Secondary | ICD-10-CM | POA: Diagnosis not present

## 2021-07-20 ENCOUNTER — Encounter: Payer: Self-pay | Admitting: Gastroenterology

## 2021-07-20 ENCOUNTER — Ambulatory Visit: Payer: Medicare Other | Admitting: Gastroenterology

## 2021-07-20 VITALS — BP 118/50 | HR 56 | Ht 61.0 in | Wt 133.4 lb

## 2021-07-20 DIAGNOSIS — K250 Acute gastric ulcer with hemorrhage: Secondary | ICD-10-CM | POA: Diagnosis not present

## 2021-07-20 DIAGNOSIS — K922 Gastrointestinal hemorrhage, unspecified: Secondary | ICD-10-CM | POA: Diagnosis not present

## 2021-07-20 MED ORDER — PANTOPRAZOLE SODIUM 40 MG PO TBEC: 40.0000 mg | DELAYED_RELEASE_TABLET | Freq: Two times a day (BID) | ORAL | 2 refills | Status: DC

## 2021-07-20 NOTE — Patient Instructions (Addendum)
It was my pleasure to provide care to you today. Based on our discussion, I am providing you with my recommendations below:  RECOMMENDATION(S):   Start Pantoprazole 40 mg twice daily 30-60 minutes before breakfast.  You have been scheduled for an endoscopy. Please follow written instructions given to you at your visit today. If you use inhalers (even only as needed), please bring them with you on the day of your procedure.   BMI:  If you are age 77 or older, your body mass index should be between 23-30. Your Body mass index is 25.2 kg/m. If this is out of the aforementioned range listed, please consider follow up with your Primary Care Provider.  MY CHART:  The [place] [facility] providers would like to encourage you to use [facility] to communicate with providers for non-urgent requests or questions.  Due to long hold times on the telephone, sending your provider a message by [facility] may be a faster and more efficient way to get a response.  Please allow 48 business hours for a response.  Please remember that this is for non-urgent requests.   Thank you for trusting me with your gastrointestinal care!    Alonza Bogus, PA-C

## 2021-07-20 NOTE — Progress Notes (Signed)
07/20/2021 ELIN EGGER PM:[id number] 03/26/1946   HISTORY OF PRESENT ILLNESS: This is a 75 year old female who is a patient Dr. Woodward Ku.  She is here today for hospital follow-up.  She was hospitalized in July for an upper GI bleed that was found to be due to gastric ulcer as seen on EGD as follows:  - Esophagogastric landmarks identified. - 2 cm hiatal hernia. - Normal esophagus otherwise - Non-bleeding gastric ulcers with a clean ulcer base (Forrest Class III), as described above. Biopsied. - Normal stomach otherwise - biopsies taken to rule out H pylori - Normal duodenal bulb and second portion of the duodenum.  A. STOMACH, ULCER, BIOPSY:  - Reactive changes suggestive of adjacent ulcer.  - No intestinal metaplasia, dysplasia, or malignancy.   B. STOMACH, BIOPSY:  - Mild chronic gastritis.  - Warthin-Starry is negative for Helicobacter pylori.  - No intestinal metaplasia, dysplasia, or malignancy.   She did receive 1 unit of packed red blood cells while hospitalized for hemoglobin of 7.2 g.  She also received a dose of IV iron.  She says that she had labs performed by her PCP last week and was told that her hemoglobin is now normal.  She has had no further sign of GI bleeding.  She says that she feels great.  She has been on her pantoprazole 40 mg twice daily and the plan was to decrease it to once a day after about 4 weeks.  Plan was to repeat EGD in a couple months to confirm that ulcers had healed.  Past Medical History:  Diagnosis Date   Allergic rhinitis    Anxiety disorder    Asthma    Breast cancer ([place])    bilateral   CAD (coronary artery disease)    a. NSTEMI 8/04 => LHC 07/2003:  mLAD hazy 75% => Taxus DES, EF 30% with ant-apical, apical and inf-apical AK.  b.  Echo 4/05: EF 60%.  c.  ETT/Lexiscan Myoview 06/2011: EF 80%, no ischemia, normal wall motion   Diverticulosis 11/13/2004   Hyperplastic colon polyp 10/16/1999   Hypertension    IBS (irritable  bowel syndrome)    Internal and external hemorrhoids without complication 123456   MI (myocardial infarction) ([place]) 12/03/2002   Anteroapical MI with PCI to the LAD   Positive PPD    PVC (premature ventricular contraction)    Past Surgical History:  Procedure Laterality Date   APPENDECTOMY     bilateral mastectomy     BIOPSY  06/21/2021   Procedure: BIOPSY;  Surgeon: Yetta Flock, MD;  Location: [facility];  Service: Gastroenterology;;   CARDIOVASCULAR STRESS TEST  07/01/2006   ef 80%   CORONARY ANGIOPLASTY WITH STENT PLACEMENT  2004   LAD   ESOPHAGOGASTRODUODENOSCOPY N/A 06/21/2021   Procedure: ESOPHAGOGASTRODUODENOSCOPY (EGD);  Surgeon: Yetta Flock, MD;  Location: [facility];  Service: Gastroenterology;  Laterality: N/A;   US ECHOCARDIOGRAPHY  03/27/2004   resting ef 60%    reports that she quit smoking about 54 years ago. Her smoking use included cigarettes. She has never used smokeless tobacco. She reports current alcohol use. She reports that she does not use drugs. family history includes Asthma in her daughter; Breast cancer in her mother and sister; Heart disease in her maternal grandmother; Spina bifida in her unknown relative. Allergies  Allergen Reactions   Clindamycin Diarrhea and Nausea And Vomiting    Blood in diarrhea   Codeine Other (See Comments)    headache  Lisinopril Cough   Simvastatin Diarrhea      Outpatient Encounter Medications as of 07/20/2021  Medication Sig   acetaminophen (TYLENOL) 325 MG tablet Take 650 mg by mouth every 6 (six) hours as needed for mild pain.    albuterol (VENTOLIN HFA) 108 (90 Base) MCG/ACT inhaler Inhale 2 puffs into the lungs every 4 (four) hours as needed for shortness of breath.   aspirin 81 MG tablet Take 81 mg by mouth daily.    denosumab (PROLIA) 60 MG/ML SOSY injection Inject 60 mg into the skin every 6 (six) months.   escitalopram (LEXAPRO) 10 MG tablet Take 10 mg by mouth daily.   FLOVENT HFA 220  MCG/ACT inhaler Inhale 1 puff into the lungs 2 (two) times daily.   fluticasone (FLONASE) 50 MCG/ACT nasal spray Place 1 spray into both nostrils 2 (two) times daily.   hydrochlorothiazide (HYDRODIURIL) 25 MG tablet Take 1 tablet (25 mg total) by mouth daily.   irbesartan (AVAPRO) 150 MG tablet Take 150 mg by mouth daily.   ketotifen (ZADITOR) 0.025 % ophthalmic solution Place 1 drop into both eyes 2 (two) times daily as needed (allergies).   nebivolol (BYSTOLIC) 2.5 MG tablet Take 1 tablet (2.5 mg total) by mouth daily.   pantoprazole (PROTONIX) 40 MG tablet Take 1 tablet (40 mg total) by mouth daily.   polyvinyl alcohol (LIQUIFILM TEARS) 1.4 % ophthalmic solution Place 1 drop into both eyes as needed for dry eyes.   Potassium Chloride ER 20 MEQ TBCR Take 20 mEq by mouth 2 (two) times daily.   PREMARIN vaginal cream Place 0.5 Applicatorfuls vaginally 2 (two) times a week. On Tuesday and Saturday     Use as directed   rosuvastatin (CRESTOR) 5 MG tablet Take 1 tablet (5 mg total) by mouth daily.   No facility-administered encounter medications on file as of 07/20/2021.     REVIEW OF SYSTEMS  : All other systems reviewed and negative except where noted in the History of Present Illness.   PHYSICAL EXAM: BP (!) 118/50   Pulse (!) 56   Ht '5\' 1"'$  (1.549 m)   Wt 133 lb 6 oz (60.5 kg)   BMI 25.20 kg/m  General: Well developed white female in no acute distress Head: Normocephalic and atraumatic Eyes:  Sclerae anicteric, conjunctiva pink. Ears: Normal auditory acuity Lungs: Clear throughout to auscultation; no W/R/R. Heart: Regular rate and rhythm; no M/R/G. Abdomen: Soft, non-distended.  BS present.  Non-tender. Musculoskeletal: Symmetrical with no gross deformities  Skin: No lesions on visible extremities Extremities: No edema  Neurological: Alert oriented x 4, grossly non-focal Psychological:  Alert and cooperative. Normal mood and affect  ASSESSMENT AND PLAN: *75 year old female here  for hospital follow-up of upper GI bleed secondary to gastric ulcers.  She has been on pantoprazole 40 mg twice daily.  She feels great.  No complaints or further signs of GI bleeding.  She tells me that her PCP performed labs last week and said her blood counts were normal.  We will request those results.  Plan was to continue pantoprazole 40 mg twice daily for a month and then decrease to once daily.  New prescription will be sent to her pharmacy.  We will plan for repeat endoscopy in about 8 weeks, mid to late September, with Dr. Silverio Decamp.  The risks, benefits, and alternatives to EGD were discussed with the patient and she consents to proceed.    CC:  Cari Caraway, MD

## 2021-07-31 NOTE — Progress Notes (Signed)
Reviewed and agree with documentation and assessment and plan. K. Veena [place] , MD   

## 2021-08-01 DIAGNOSIS — J320 Chronic maxillary sinusitis: Secondary | ICD-10-CM | POA: Diagnosis not present

## 2021-08-23 ENCOUNTER — Other Ambulatory Visit: Payer: Self-pay

## 2021-08-23 ENCOUNTER — Telehealth: Payer: Self-pay | Admitting: Gastroenterology

## 2021-08-23 ENCOUNTER — Ambulatory Visit (AMBULATORY_SURGERY_CENTER): Payer: Medicare Other | Admitting: Gastroenterology

## 2021-08-23 ENCOUNTER — Encounter: Payer: Self-pay | Admitting: Gastroenterology

## 2021-08-23 VITALS — BP 107/62 | HR 52 | Temp 96.6°F | Resp 18 | Ht 61.0 in | Wt 133.0 lb

## 2021-08-23 DIAGNOSIS — K219 Gastro-esophageal reflux disease without esophagitis: Secondary | ICD-10-CM | POA: Diagnosis not present

## 2021-08-23 DIAGNOSIS — K250 Acute gastric ulcer with hemorrhage: Secondary | ICD-10-CM | POA: Diagnosis not present

## 2021-08-23 DIAGNOSIS — K317 Polyp of stomach and duodenum: Secondary | ICD-10-CM

## 2021-08-23 DIAGNOSIS — K222 Esophageal obstruction: Secondary | ICD-10-CM

## 2021-08-23 DIAGNOSIS — K254 Chronic or unspecified gastric ulcer with hemorrhage: Secondary | ICD-10-CM | POA: Diagnosis not present

## 2021-08-23 MED ORDER — SODIUM CHLORIDE 0.9 % IV SOLN
500.0000 mL | Freq: Once | INTRAVENOUS | Status: DC
Start: 2021-08-23 — End: 2024-02-18

## 2021-08-23 MED ORDER — PANTOPRAZOLE SODIUM 40 MG PO TBEC: 40.0000 mg | DELAYED_RELEASE_TABLET | Freq: Two times a day (BID) | ORAL | 3 refills | Status: DC

## 2021-08-23 MED ORDER — SUCRALFATE 1 G PO TABS: 1.0000 g | ORAL_TABLET | Freq: Two times a day (BID) | ORAL | 0 refills | Status: DC

## 2021-08-23 NOTE — Progress Notes (Signed)
To pacu, VSS. Report to [email or web address] 

## 2021-08-23 NOTE — Progress Notes (Signed)
[facility] History and Physical   Primary Care Physician:  Cari Caraway, MD   Reason for Procedure:  Follow up of gastric ulcer  Plan:    EGD with possible interventions as needed     HPI: [name] [name] is a very pleasant 75 y.o. female here for EGD Denies any nausea, vomiting, abdominal pain, melena or bright red blood per rectum  The risks and benefits as well as alternatives of endoscopic procedure(s) have been discussed and reviewed. All questions answered. The patient agrees to proceed.    Past Medical History:  Diagnosis Date   Allergic rhinitis    Anemia    Anxiety disorder    Asthma    Blood transfusion without reported diagnosis    Breast cancer ([place])    bilateral   CAD (coronary artery disease)    a. NSTEMI 8/04 => LHC 07/2003:  mLAD hazy 75% => Taxus DES, EF 30% with ant-apical, apical and inf-apical AK.  b.  Echo 4/05: EF 60%.  c.  ETT/Lexiscan Myoview 06/2011: EF 80%, no ischemia, normal wall motion   Cataract    Diverticulosis 11/13/2004   Hyperplastic colon polyp 10/16/1999   Hypertension    IBS (irritable bowel syndrome)    Internal and external hemorrhoids without complication 50/93/2671   MI (myocardial infarction) ([place]) 12/03/2002   Anteroapical MI with PCI to the LAD   Positive PPD    PVC (premature ventricular contraction)     Past Surgical History:  Procedure Laterality Date   APPENDECTOMY     bilateral mastectomy     BIOPSY  06/21/2021   Procedure: BIOPSY;  Surgeon: Yetta Flock, MD;  Location: [facility];  Service: Gastroenterology;;   CARDIOVASCULAR STRESS TEST  07/01/2006   ef 80%   CORONARY ANGIOPLASTY WITH STENT PLACEMENT  2004   LAD   ESOPHAGOGASTRODUODENOSCOPY N/A 06/21/2021   Procedure: ESOPHAGOGASTRODUODENOSCOPY (EGD);  Surgeon: Yetta Flock, MD;  Location: [facility];  Service: Gastroenterology;  Laterality: N/A;   US ECHOCARDIOGRAPHY  03/27/2004   resting ef 60%    Prior to Admission  medications   Medication Sig Start Date End Date Taking? Authorizing Provider  acetaminophen (TYLENOL) 325 MG tablet Take 650 mg by mouth every 6 (six) hours as needed for mild pain.    Yes [provider]  albuterol (VENTOLIN HFA) 108 (90 Base) MCG/ACT inhaler Inhale 2 puffs into the lungs every 4 (four) hours as needed for shortness of breath.   Yes [provider]  aspirin 81 MG tablet Take 81 mg by mouth daily.    Yes [provider]  escitalopram (LEXAPRO) 10 MG tablet Take 10 mg by mouth daily.   Yes [provider]  FLOVENT HFA 220 MCG/ACT inhaler Inhale 1 puff into the lungs 2 (two) times daily. 01/20/21  Yes [provider]  fluticasone (FLONASE) 50 MCG/ACT nasal spray Place 1 spray into both nostrils 2 (two) times daily. 01/08/14  Yes [provider]  hydrochlorothiazide (HYDRODIURIL) 25 MG tablet Take 1 tablet (25 mg total) by mouth daily. 02/13/16  Yes Nahser, Wonda Cheng, MD  irbesartan (AVAPRO) 150 MG tablet Take 150 mg by mouth daily. 08/07/17  Yes [provider]  ketotifen (ZADITOR) 0.025 % ophthalmic solution Place 1 drop into both eyes 2 (two) times daily as needed (allergies).   Yes [provider]  nebivolol (BYSTOLIC) 2.5 MG tablet Take 1 tablet (2.5 mg total) by mouth daily. 09/05/20  Yes Nahser, Wonda Cheng, MD  pantoprazole ([place])  40 MG tablet Take 1 tablet (40 mg total) by mouth 2 (two) times daily before a meal. 07/20/21  Yes Zehr, Janett Billow D, PA-C  polyvinyl alcohol (LIQUIFILM TEARS) 1.4 % ophthalmic solution Place 1 drop into both eyes as needed for dry eyes.   Yes [provider]  Potassium Chloride ER 20 MEQ TBCR Take 20 mEq by mouth 2 (two) times daily. 01/13/15  Yes Nahser, Wonda Cheng, MD  PREMARIN vaginal cream Place 0.5 Applicatorfuls vaginally 2 (two) times a week. On Tuesday and Saturday     Use as directed 05/03/11  Yes [provider]  rosuvastatin (CRESTOR) 5 MG tablet Take 1 tablet (5  mg total) by mouth daily. 01/13/13  Yes Nahser, Wonda Cheng, MD  denosumab (PROLIA) 60 MG/ML SOSY injection Inject 60 mg into the skin every 6 (six) months.    [provider]    Current Outpatient Medications  Medication Sig Dispense Refill   acetaminophen (TYLENOL) 325 MG tablet Take 650 mg by mouth every 6 (six) hours as needed for mild pain.      albuterol (VENTOLIN HFA) 108 (90 Base) MCG/ACT inhaler Inhale 2 puffs into the lungs every 4 (four) hours as needed for shortness of breath.     aspirin 81 MG tablet Take 81 mg by mouth daily.      escitalopram (LEXAPRO) 10 MG tablet Take 10 mg by mouth daily.     FLOVENT HFA 220 MCG/ACT inhaler Inhale 1 puff into the lungs 2 (two) times daily.     fluticasone (FLONASE) 50 MCG/ACT nasal spray Place 1 spray into both nostrils 2 (two) times daily.     hydrochlorothiazide (HYDRODIURIL) 25 MG tablet Take 1 tablet (25 mg total) by mouth daily. 90 tablet 3   irbesartan (AVAPRO) 150 MG tablet Take 150 mg by mouth daily.     ketotifen (ZADITOR) 0.025 % ophthalmic solution Place 1 drop into both eyes 2 (two) times daily as needed (allergies).     nebivolol (BYSTOLIC) 2.5 MG tablet Take 1 tablet (2.5 mg total) by mouth daily. 30 tablet 11   pantoprazole (PROTONIX) 40 MG tablet Take 1 tablet (40 mg total) by mouth 2 (two) times daily before a meal. 60 tablet 2   polyvinyl alcohol (LIQUIFILM TEARS) 1.4 % ophthalmic solution Place 1 drop into both eyes as needed for dry eyes.     Potassium Chloride ER 20 MEQ TBCR Take 20 mEq by mouth 2 (two) times daily. 62 tablet 11   PREMARIN vaginal cream Place 0.5 Applicatorfuls vaginally 2 (two) times a week. On Tuesday and Saturday     Use as directed     rosuvastatin (CRESTOR) 5 MG tablet Take 1 tablet (5 mg total) by mouth daily. 30 tablet 6   denosumab (PROLIA) 60 MG/ML SOSY injection Inject 60 mg into the skin every 6 (six) months.     Current Facility-Administered Medications  Medication Dose Route Frequency  Provider Last Rate Last Admin   0.9 %  sodium chloride infusion  500 mL Intravenous Once Mauri Pole, MD        Allergies as of 08/23/2021 - Review Complete 08/23/2021  Allergen Reaction Noted   Clindamycin Diarrhea and Nausea And Vomiting 06/21/2021   Codeine Other (See Comments) 02/09/2014   Lisinopril Cough 02/28/2011   Simvastatin Diarrhea 02/28/2011    Family History  Problem Relation Age of Onset   Breast cancer Mother    Breast cancer Sister    Heart disease Maternal Grandmother  Asthma Daughter    Spina bifida Other    Colon cancer Neg Hx    Esophageal cancer Neg Hx    Rectal cancer Neg Hx    Stomach cancer Neg Hx     Social History   Socioeconomic History   Marital status: Divorced    Spouse name: Not on file   Number of children: Not on file   Years of education: Not on file   Highest education level: Not on file  Occupational History   Occupation: retired  Tobacco Use   Smoking status: Former    Types: Cigarettes    Quit date: 08/07/1966    Years since quitting: 55.0   Smokeless tobacco: Never  Vaping Use   Vaping Use: Never used  Substance and Sexual Activity   Alcohol use: Yes    Alcohol/week: 0.0 standard drinks    Comment: once every other month   Drug use: No   Sexual activity: Not on file    Comment: not asked  Other Topics Concern   Not on file  Social History Narrative   Not on file   Social Determinants of Health   Financial Resource Strain: Not on file  Food Insecurity: Not on file  Transportation Needs: Not on file  Physical Activity: Not on file  Stress: Not on file  Social Connections: Not on file  Intimate Partner Violence: Not on file    Review of Systems:  All other review of systems negative except as mentioned in the HPI.  Physical Exam: Vital signs in last 24 hours: BP (!) 173/83   Pulse 60   Temp (!) 96.6 F (35.9 C)   Resp 15   Ht 5\' 1"  (1.549 m)   Wt 133 lb (60.3 kg)   SpO2 100%   BMI 25.13 kg/m      General:   Alert, NAD Lungs:  Clear .   Heart:  Regular rate and rhythm Abdomen:  Soft, nontender and nondistended. Neuro/Psych:  Alert and cooperative. Normal mood and affect. A and O x 3  Reviewed labs, radiology imaging, old records and pertinent past GI work up  Patient is appropriate for planned procedure(s) and anesthesia in an ambulatory setting   K. Denzil Magnuson , MD [phone]

## 2021-08-23 NOTE — Telephone Encounter (Signed)
[facility] called to advise they only received Protonix not the Carafate or sodium chloride medication please resend the patients son is waiting at location.

## 2021-08-23 NOTE — Op Note (Signed)
[facility] Patient Name: [name] [name] Procedure Date: 08/23/2021 1:19 PM MRN: [id number] Endoscopist: Mauri Pole , MD Age: 75 Referring MD:  Date of Birth: 1946-05-22 Gender: Female Account #: [id number] Procedure:                Upper GI endoscopy Indications:              Follow-up of acute gastric ulcer with hemorrhage Medicines:                Monitored Anesthesia Care Procedure:                Pre-Anesthesia Assessment:                           - Prior to the procedure, a History and Physical                            was performed, and patient medications and                            allergies were reviewed. The patient's tolerance of                            previous anesthesia was also reviewed. The risks                            and benefits of the procedure and the sedation                            options and risks were discussed with the patient.                            All questions were answered, and informed consent                            was obtained. Prior Anticoagulants: The patient has                            taken no previous anticoagulant or antiplatelet                            agents. ASA Grade Assessment: III - A patient with                            severe systemic disease. After reviewing the risks                            and benefits, the patient was deemed in                            satisfactory condition to undergo the procedure.                           After obtaining informed consent, the endoscope was  passed under direct vision. Throughout the                            procedure, the patient's blood pressure, pulse, and                            oxygen saturations were monitored continuously. The                            Endoscope was introduced through the mouth, and                            advanced to the second part of duodenum. The upper                             GI endoscopy was accomplished without difficulty.                            The patient tolerated the procedure well. Scope In: Scope Out: Findings:                 LA Grade B (one or more mucosal breaks greater than                            5 mm, not extending between the tops of two mucosal                            folds) esophagitis with no bleeding was found 34 to                            35 cm from the incisors.                           The exam of the esophagus was otherwise normal.                           One non-bleeding cratered gastric ulcer with no                            stigmata of bleeding was found in the gastric                            antrum. The lesion was 6 mm in largest dimension.                            Biopsies were taken with a cold forceps for                            histology.                           A single 15 mm pedunculated polyp with no bleeding  and no stigmata of recent bleeding was found in the                            cardia. Biopsies were taken with a cold forceps for                            histology.                           The examined duodenum was normal. Complications:            No immediate complications. Estimated Blood Loss:     Estimated blood loss was minimal. Impression:               - LA Grade B reflux esophagitis with no bleeding.                           - Non-bleeding gastric ulcer with no stigmata of                            bleeding. Biopsied.                           - A single gastric polyp. Biopsied.                           - Normal examined duodenum. Recommendation:           - Resume previous diet.                           - Continue present medications.                           - Await pathology results.                           - No aspirin, ibuprofen, naproxen, or other                            non-steroidal anti-inflammatory drugs.                            - Use Protonix (pantoprazole) 40 mg PO BID for 3                            months followed by 40mg  once daily.                           - Use sucralfate tablets 1 gram PO BID for 2 weeks.                           - Follow up in GI office in 3 months, please call                            to schedule appointment. Mauri Pole, MD 08/23/2021 1:58:25 PM  This report has been signed electronically.

## 2021-08-23 NOTE — Telephone Encounter (Signed)
[facility], they did receive the Carafate prescription. They had to look at the prescription as it was messed up on their end when it came through the system. I have been advised that they do not currently have any in and it has been ordered. The patient's son will pick it up tomorrow.

## 2021-08-23 NOTE — Progress Notes (Signed)
Called to room to assist during endoscopic procedure.  Patient ID and intended procedure confirmed with present staff. Received instructions for my participation in the procedure from the performing physician.  

## 2021-08-23 NOTE — Progress Notes (Signed)
VS taken by DT 

## 2021-08-23 NOTE — Patient Instructions (Signed)
Resume previous diet and continue present medications. Awaiting pathology results. No aspirin, ibuprofen, naproxen, or other non-steroidal anti-inflammatory drugs. Use Protonix 40 mg by mouth twice daily for 3 months followed by 40 mg once daily. Use Sucralfate tablets 1 gram by mouth twice daily for 2 weeks.  YOU HAD AN ENDOSCOPIC PROCEDURE TODAY AT [facility]:   Refer to the procedure report that was given to you for any specific questions about what was found during the examination.  If the procedure report does not answer your questions, please call your gastroenterologist to clarify.  If you requested that your care partner not be given the details of your procedure findings, then the procedure report has been included in a sealed envelope for you to review at your convenience later.  YOU SHOULD EXPECT: Some feelings of bloating in the abdomen. Passage of more gas than usual.  Walking can help get rid of the air that was put into your GI tract during the procedure and reduce the bloating. If you had a lower endoscopy (such as a colonoscopy or flexible sigmoidoscopy) you may notice spotting of blood in your stool or on the toilet paper. If you underwent a bowel prep for your procedure, you may not have a normal bowel movement for a few days.  Please Note:  You might notice some irritation and congestion in your nose or some drainage.  This is from the oxygen used during your procedure.  There is no need for concern and it should clear up in a day or so.  SYMPTOMS TO REPORT IMMEDIATELY:  Following upper endoscopy (EGD)  Vomiting of blood or coffee ground material  New chest pain or pain under the shoulder blades  Painful or persistently difficult swallowing  New shortness of breath  Fever of 100F or higher  Black, tarry-looking stools  For urgent or emergent issues, a gastroenterologist can be reached at any hour by calling [phone]. Do not use MyChart messaging for  urgent concerns.    DIET:  We do recommend a small meal at first, but then you may proceed to your regular diet.  Drink plenty of fluids but you should avoid alcoholic beverages for 24 hours.  ACTIVITY:  You should plan to take it easy for the rest of today and you should NOT DRIVE or use heavy machinery until tomorrow (because of the sedation medicines used during the test).    FOLLOW UP: Our staff will call the number listed on your records 48-72 hours following your procedure to check on you and address any questions or concerns that you may have regarding the information given to you following your procedure. If we do not reach you, we will leave a message.  We will attempt to reach you two times.  During this call, we will ask if you have developed any symptoms of COVID 19. If you develop any symptoms (ie: fever, flu-like symptoms, shortness of breath, cough etc.) before then, please call [phone].  If you test positive for Covid 19 in the 2 weeks post procedure, please call and report this information to [place].    If any biopsies were taken you will be contacted by phone or by letter within the next 1-3 weeks.  Please call us at [phone] if you have not heard about the biopsies in 3 weeks.    SIGNATURES/CONFIDENTIALITY: You and/or your care partner have signed paperwork which will be entered into your electronic medical record.  These signatures attest to  the fact that that the information above on your After Visit Summary has been reviewed and is understood.  Full responsibility of the confidentiality of this discharge information lies with you and/or your care-partner.

## 2021-08-25 ENCOUNTER — Telehealth: Payer: Self-pay | Admitting: *Deleted

## 2021-08-25 NOTE — Telephone Encounter (Signed)
  Follow up Call-  Call back number 08/23/2021  Post procedure Call Back phone  # [phone]  Permission to leave phone message Yes  Some recent data might be hidden     Patient questions:  Do you have a fever, pain , or abdominal swelling? No. Pain Score  0 *  Have you tolerated food without any problems? Yes.    Have you been able to return to your normal activities? Yes.    Do you have any questions about your discharge instructions: Diet   No. Medications  No. Follow up visit  No.  Do you have questions or concerns about your Care? No.  Actions: * If pain score is 4 or above: No action needed, pain <4.  Have you developed a fever since your procedure? no  2.   Have you had an respiratory symptoms (SOB or cough) since your procedure? no  3.   Have you tested positive for COVID 19 since your procedure no  4.   Have you had any family members/close contacts diagnosed with the COVID 19 since your procedure?  no   If yes to any of these questions please route to Joylene John, RN and Joella Prince, RN

## 2021-08-31 DIAGNOSIS — R790 Abnormal level of blood mineral: Secondary | ICD-10-CM | POA: Diagnosis not present

## 2021-08-31 DIAGNOSIS — I2510 Atherosclerotic heart disease of native coronary artery without angina pectoris: Secondary | ICD-10-CM | POA: Diagnosis not present

## 2021-08-31 DIAGNOSIS — M810 Age-related osteoporosis without current pathological fracture: Secondary | ICD-10-CM | POA: Diagnosis not present

## 2021-08-31 DIAGNOSIS — I10 Essential (primary) hypertension: Secondary | ICD-10-CM | POA: Diagnosis not present

## 2021-08-31 DIAGNOSIS — J4540 Moderate persistent asthma, uncomplicated: Secondary | ICD-10-CM | POA: Diagnosis not present

## 2021-08-31 DIAGNOSIS — N390 Urinary tract infection, site not specified: Secondary | ICD-10-CM | POA: Diagnosis not present

## 2021-08-31 DIAGNOSIS — E782 Mixed hyperlipidemia: Secondary | ICD-10-CM | POA: Diagnosis not present

## 2021-08-31 DIAGNOSIS — L719 Rosacea, unspecified: Secondary | ICD-10-CM | POA: Diagnosis not present

## 2021-08-31 DIAGNOSIS — G2581 Restless legs syndrome: Secondary | ICD-10-CM | POA: Diagnosis not present

## 2021-08-31 DIAGNOSIS — K922 Gastrointestinal hemorrhage, unspecified: Secondary | ICD-10-CM | POA: Diagnosis not present

## 2021-08-31 DIAGNOSIS — J309 Allergic rhinitis, unspecified: Secondary | ICD-10-CM | POA: Diagnosis not present

## 2021-09-05 ENCOUNTER — Encounter: Payer: Self-pay | Admitting: Cardiovascular Disease

## 2021-09-05 ENCOUNTER — Other Ambulatory Visit: Payer: Self-pay

## 2021-09-05 ENCOUNTER — Ambulatory Visit: Payer: Medicare Other | Admitting: Cardiovascular Disease

## 2021-09-05 ENCOUNTER — Encounter: Payer: Self-pay | Admitting: Gastroenterology

## 2021-09-05 VITALS — BP 130/80 | HR 56 | Ht 61.0 in | Wt 135.8 lb

## 2021-09-05 DIAGNOSIS — I10 Essential (primary) hypertension: Secondary | ICD-10-CM | POA: Diagnosis not present

## 2021-09-05 DIAGNOSIS — E785 Hyperlipidemia, unspecified: Secondary | ICD-10-CM

## 2021-09-05 DIAGNOSIS — I2510 Atherosclerotic heart disease of native coronary artery without angina pectoris: Secondary | ICD-10-CM

## 2021-09-05 MED ORDER — CLOPIDOGREL BISULFATE 75 MG PO TABS: 75.0000 mg | ORAL_TABLET | Freq: Every day | ORAL | 3 refills | Status: DC

## 2021-09-05 NOTE — Patient Instructions (Signed)
Medication Instructions:  Your physician has recommended you make the following change in your medication:  STOP Aspirin START Clopidogrel (Plavix) 75 mg daily  *If you need a refill on your cardiac medications before your next appointment, please call your pharmacy*   Lab Work: None Ordered If you have labs (blood work) drawn today and your tests are completely normal, you will receive your results only by: [place] (if you have MyChart) OR A paper copy in the mail If you have any lab test that is abnormal or we need to change your treatment, we will call you to review the results.   Testing/Procedures: None Ordered   Follow-Up: At [facility], you and your health needs are our priority.  As part of our continuing mission to provide you with exceptional heart care, we have created designated Provider Care Teams.  These Care Teams include your primary Cardiologist (physician) and Advanced Practice Providers (APPs -  Physician Assistants and Nurse Practitioners) who all work together to provide you with the care you need, when you need it.     Your next appointment:   1 year(s)  The format for your next appointment:   In Person  Provider:   You may see Mertie Moores, MD or one of the following Advanced Practice Providers on your designated Care Team:   Richardson Dopp, PA-C [place], [place]

## 2021-09-05 NOTE — Progress Notes (Signed)
Cardiology Office Note   Date:  09/05/2021   ID:  [name] [name], DOB Aug 24, 1946, MRN [id number]  PCP:  Cari Caraway, MD  Cardiologist:   Mertie Moores, MD   Chief Complaint  Patient presents with   Coronary Artery Disease   Problem list: 1.  Coronary artery disease-status post PTCA and stenting of her left anterior descending artery (August, 2004),  3.0 x 20 mm Taxus stent inflated up to 12 atmospheres.  2. Hyperlipidemia 3. Breast Cancer 4. Asthma    Feb. 11, 2016  [name] [name] is a 75 y.o. female who presents for follow up of her CAD. She has seen Cecille Rubin for the past several visits.  She has been seen in the ER for dyspnea.   Echo in July, 2015 showed normal LV function with trivial valvular abnormalities. She was started on HCTZ - has had some orthostasis   She has been feeling much better recently . Thinks her potassium was too low. No chest pain . Has some palpitations.    Feb. 10, 2017:  [name] [name] is doing great  Doing great.   BP has been ok. Is elevated here.   Exercises in the summer. Not as much in the winter   No CP or dyspnea.   Aug. 18, 2017:  Doing great. Had shingles ,  Doing better now.   Slow to heal .  Sept. 11, 2018:  [name] [name] is doing well.  Doing better on the Bystolic - was having lots of palpitations . No angina   Sept. 12, 2019:  Slowing down some, Still gardening  -  [place] , She brought lab work from [employer].  Her vitamin D level is 56.9.  Total cholesterol is 135.  Triglyceride level is 53.  LDL is 65.  HDL is 60. Electrolyte panel looks good.  Glucose is 90.  Creatinine is 0.8.  Sodium is 141.  Potassium is 3.9.  Oct. 2 2020   [name] [name] is seen for follow   She brought labs from her primary medical doctor's office from February 10, 2019.  Her total cholesterol is 120 HDL is 52 Triglyceride level is 57 LDL is 57. Glucose level is 86.  Creatinine 0.82.  Sodium and potassium are within normal limits.  Liver enzymes are normal.  Vitamin  D level was 70.  Oct. 4, 2021: [name] [name] is seen today for follow up of her CAD and HLD   troubles with asthma No cp, dyspnea, presyncope  Labs from Dr. Addison Lank were reviewed.  Cholesterol levels equals 122 HDL is 44 Triglyceride level is 66 LDL is 65.  Basic metabolic profile is relatively stable.  Sodium is 140.  Potassium is 3.7.  Creatinine is 1.0.  Oct. 4, 2022:  And is seen today for follow-up of her coronary artery disease and hypertension. Developed a GI bleed, Was found to have 2 GI ulcers.  She is still on ASA  We will DC ASA and start plavix 75 mg a day   She needs to have surgery on a nasal polyp.   She is at low risk for this upcoming surgery.  She will need to hold her Plavix for 5 to 7 days prior to her surgery.  My recommendation would be to restart the Plavix as soon as it is deemed safe from a surgical standpoint following her surgery.    No CP  Grows her flower garden yearly . Lipids from March , 2022 look good   Past Medical History:  Diagnosis Date  Allergic rhinitis    Anemia    Anxiety disorder    Asthma    Blood transfusion without reported diagnosis    Breast cancer ([place])    bilateral   CAD (coronary artery disease)    a. NSTEMI 8/04 => LHC 07/2003:  mLAD hazy 75% => Taxus DES, EF 30% with ant-apical, apical and inf-apical AK.  b.  Echo 4/05: EF 60%.  c.  ETT/Lexiscan Myoview 06/2011: EF 80%, no ischemia, normal wall motion   Cataract    Diverticulosis 11/13/2004   Hyperplastic colon polyp 10/16/1999   Hypertension    IBS (irritable bowel syndrome)    Internal and external hemorrhoids without complication 27/78/2423   MI (myocardial infarction) ([place]) 12/03/2002   Anteroapical MI with PCI to the LAD   Positive PPD    PVC (premature ventricular contraction)     Past Surgical History:  Procedure Laterality Date   APPENDECTOMY     bilateral mastectomy     BIOPSY  06/21/2021   Procedure: BIOPSY;  Surgeon: Yetta Flock, MD;  Location: [facility]  ENDOSCOPY;  Service: Gastroenterology;;   CARDIOVASCULAR STRESS TEST  07/01/2006   ef 80%   CORONARY ANGIOPLASTY WITH STENT PLACEMENT  2004   LAD   ESOPHAGOGASTRODUODENOSCOPY N/A 06/21/2021   Procedure: ESOPHAGOGASTRODUODENOSCOPY (EGD);  Surgeon: Yetta Flock, MD;  Location: [facility];  Service: Gastroenterology;  Laterality: N/A;   US ECHOCARDIOGRAPHY  03/27/2004   resting ef 60%     Current Outpatient Medications  Medication Sig Dispense Refill   acetaminophen (TYLENOL) 325 MG tablet Take 650 mg by mouth every 6 (six) hours as needed for mild pain.      albuterol (VENTOLIN HFA) 108 (90 Base) MCG/ACT inhaler Inhale 2 puffs into the lungs every 4 (four) hours as needed for shortness of breath.     aspirin 81 MG tablet Take 81 mg by mouth daily.      denosumab (PROLIA) 60 MG/ML SOSY injection Inject 60 mg into the skin every 6 (six) months.     escitalopram (LEXAPRO) 10 MG tablet Take 10 mg by mouth daily.     FLOVENT HFA 220 MCG/ACT inhaler Inhale 1 puff into the lungs 2 (two) times daily.     fluticasone (FLONASE) 50 MCG/ACT nasal spray Place 1 spray into both nostrils 2 (two) times daily.     hydrochlorothiazide (HYDRODIURIL) 25 MG tablet Take 1 tablet (25 mg total) by mouth daily. 90 tablet 3   irbesartan (AVAPRO) 150 MG tablet Take 150 mg by mouth daily.     ketotifen (ZADITOR) 0.025 % ophthalmic solution Place 1 drop into both eyes 2 (two) times daily as needed (allergies).     nebivolol (BYSTOLIC) 2.5 MG tablet Take 1 tablet (2.5 mg total) by mouth daily. 30 tablet 11   pantoprazole (PROTONIX) 40 MG tablet Take 1 tablet (40 mg total) by mouth 2 (two) times daily. 60 tablet 3   polyvinyl alcohol (LIQUIFILM TEARS) 1.4 % ophthalmic solution Place 1 drop into both eyes as needed for dry eyes.     Potassium Chloride ER 20 MEQ TBCR Take 20 mEq by mouth 2 (two) times daily. 62 tablet 11   PREMARIN vaginal cream Place 0.5 Applicatorfuls vaginally 2 (two) times a week. On Tuesday and  Saturday     Use as directed     rosuvastatin (CRESTOR) 5 MG tablet Take 1 tablet (5 mg total) by mouth daily. 30 tablet 6   sucralfate (CARAFATE) 1 g tablet Take 1 tablet (  1 g total) by mouth 2 (two) times daily for 14 days. 28 tablet 0   Current Facility-Administered Medications  Medication Dose Route Frequency Provider Last Rate Last Admin   0.9 %  sodium chloride infusion  500 mL Intravenous Once Nandigam, Kavitha V, MD        Allergies:   Clindamycin, Codeine, Lisinopril, and Simvastatin    Social History:  The patient  reports that she quit smoking about 55 years ago. Her smoking use included cigarettes. She has never used smokeless tobacco. She reports current alcohol use. She reports that she does not use drugs.   Family History:  The patient's family history includes Asthma in her daughter; Breast cancer in her mother and sister; Heart disease in her maternal grandmother; Spina bifida in an other family member.    ROS:  Please see the history of present illness.   Physical Exam: Blood pressure 130/80, pulse (!) 56, height 5\' 1"  (1.549 m), weight 135 lb 12.8 oz (61.6 kg), SpO2 99 %.  GEN:  Well nourished, well developed in no acute distress HEENT: Normal NECK: No JVD; No carotid bruits LYMPHATICS: No lymphadenopathy CARDIAC: RRR , no murmurs, rubs, gallops RESPIRATORY:  Clear to auscultation without rales, wheezing or rhonchi  ABDOMEN: Soft, non-tender, non-distended MUSCULOSKELETAL:  No edema; No deformity  SKIN: Warm and dry NEUROLOGIC:  Alert and oriented x 3   EKG:    September 05, 2021: Sinus bradycardia 58.  No ST or T wave changes.  Recent Labs: 06/21/2021: ALT 18 06/24/2021: BUN 14; Creatinine, Ser 0.74; Hemoglobin 8.9; Magnesium 1.9; Platelets 167; Potassium 3.8; Sodium 139    Lipid Panel    Component Value Date/Time   CHOL 108 02/11/2013 0850   TRIG 41.0 02/11/2013 0850   HDL 53.40 02/11/2013 0850   CHOLHDL 2 02/11/2013 0850   VLDL 8.2 02/11/2013 0850    LDLCALC 46 02/11/2013 0850      Wt Readings from Last 3 Encounters:  09/05/21 135 lb 12.8 oz (61.6 kg)  08/23/21 133 lb (60.3 kg)  07/20/21 133 lb 6 oz (60.5 kg)    Other studies Reviewed: Additional studies/ records that were reviewed today include: labs from Dr. Addison Lank. Review of the above records demonstrates: normal lipids   ASSESSMENT AND PLAN:  1.  Coronary artery disease -    [name] [name] has a history of coronary disease.  She is status post stenting many years ago.  She developed GI bleed.  She was on aspirin.  I would like to stop the aspirin and start her on Plavix 75 mg a day.  If this causes lots of bleeding or bruising we may have to rethink this.  She is scheduled to have nasal polyp surgery.  She is at low risk for this upcoming surgery.  She may hold the Plavix for between 5 and 7 days prior to her surgery.  My recommendation would be to restart the Plavix as soon as it is deemed safe from a surgical standpoint.   2. Hyperlipidemia -    lipids have been managed by her primary medical doctor.  Her last lipid levels look good.  3. Palpitations: -     4. Asthma -     5. Hypertension:   BP is well controlled   6.  Nasal polyp: She needs to have surgery to have a polyp removed.   She needs to have surgery on a  nasal polyd  She is at low risk for this upcoming surgery.  She  will need to hold her Plavix for 5 to 7 days prior to her surgery.  My recommendation would be to restart the Plavix as soon as it is deemed safe from a surgical standpoint following her surgery.  Current medicines are reviewed at length with the patient today.  The patient does not have concerns regarding medicines.  The following changes have been made:  no change  Disposition:     Signed, Mertie Moores, MD  09/05/2021 4:00 PM    [address], [place], [place]  [address] Phone: [phone]; Fax: [phone]

## 2021-09-07 DIAGNOSIS — D2239 Melanocytic nevi of other parts of face: Secondary | ICD-10-CM | POA: Diagnosis not present

## 2021-09-07 DIAGNOSIS — Z85820 Personal history of malignant melanoma of skin: Secondary | ICD-10-CM | POA: Diagnosis not present

## 2021-09-07 DIAGNOSIS — D692 Other nonthrombocytopenic purpura: Secondary | ICD-10-CM | POA: Diagnosis not present

## 2021-09-07 DIAGNOSIS — L738 Other specified follicular disorders: Secondary | ICD-10-CM | POA: Diagnosis not present

## 2021-09-07 DIAGNOSIS — L814 Other melanin hyperpigmentation: Secondary | ICD-10-CM | POA: Diagnosis not present

## 2021-09-07 DIAGNOSIS — D225 Melanocytic nevi of trunk: Secondary | ICD-10-CM | POA: Diagnosis not present

## 2021-09-07 DIAGNOSIS — L819 Disorder of pigmentation, unspecified: Secondary | ICD-10-CM | POA: Diagnosis not present

## 2021-09-07 DIAGNOSIS — D1801 Hemangioma of skin and subcutaneous tissue: Secondary | ICD-10-CM | POA: Diagnosis not present

## 2021-09-07 DIAGNOSIS — L821 Other seborrheic keratosis: Secondary | ICD-10-CM | POA: Diagnosis not present

## 2021-09-12 ENCOUNTER — Other Ambulatory Visit: Payer: Self-pay

## 2021-09-12 ENCOUNTER — Telehealth: Payer: Self-pay

## 2021-09-12 DIAGNOSIS — R718 Other abnormality of red blood cells: Secondary | ICD-10-CM

## 2021-09-12 NOTE — Telephone Encounter (Signed)
Called patient's PCP @ [facility] and requested her recent labs to be faxed to [phone] to Dr. Woodward Ku attention. Orders in for labs and patient agrees to come into our lab soon for those. Gave patient Dr. Woodward Ku recommendation to take Chelated Iron (OTC) and gave instructions for taking. Office visit scheduled on 10/13/21.

## 2021-09-13 NOTE — Telephone Encounter (Signed)
Agree with your Cardiologist that plavix will be less likely to cause stomach ulceration even though it is a stronger anti platelet medication. I am comfortable with plan as outlined by your cardiologist.

## 2021-09-14 ENCOUNTER — Telehealth: Payer: Self-pay | Admitting: *Deleted

## 2021-09-14 ENCOUNTER — Other Ambulatory Visit (INDEPENDENT_AMBULATORY_CARE_PROVIDER_SITE_OTHER): Payer: Medicare Other

## 2021-09-14 DIAGNOSIS — R718 Other abnormality of red blood cells: Secondary | ICD-10-CM

## 2021-09-14 LAB — B12 AND FOLATE PANEL
Folate: 13.5 ng/mL (ref >5.9)
Vitamin B-12: 187 pg/mL — ABNORMAL LOW (ref 211–911)

## 2021-09-14 NOTE — Telephone Encounter (Signed)
   [facility] Pre-operative Risk Assessment    Patient Name: [name] [name]  DOB: [birth year] MRN: [id number]  HEARTCARE STAFF:  - IMPORTANT!!!!!! Under Visit Info/Reason for Call, type in Other and utilize the format Clearance MM/DD/YY or Clearance TBD. Do not use dashes or single digits. - Please review there is not already an duplicate clearance open for this procedure. - If request is for dental extraction, please clarify the # of teeth to be extracted. - If the patient is currently at the dentist's office, call Pre-Op Callback Staff (MA/nurse) to input urgent request.  - If the patient is not currently in the dentist office, please route to the Pre-Op pool.  Request for surgical clearance:  What type of surgery is being performed? Left Endoscopic Maxillary Antrostomy  When is this surgery scheduled? 09/25/21  What type of clearance is required (medical clearance vs. Pharmacy clearance to hold med vs. Both)? Both  Are there any medications that need to be held prior to surgery and how long? Plavix  Practice name and name of physician performing surgery? [facility], Dr Lucien Mons  What is the office phone number? [phone]   7.   What is the office fax number? [phone]  8.   Anesthesia type (None, local, MAC, general) ? Not listed   [name] [name] 09/14/2021, 3:03 PM  _________________________________________________________________   (provider comments below)

## 2021-09-14 NOTE — Telephone Encounter (Signed)
   Primary Cardiologist: Mertie Moores, MD  Chart reviewed as part of pre-operative protocol coverage. Given past medical history and time since last visit, based on ACC/AHA guidelines, VERNIA TEEM would be at acceptable risk for the planned procedure without further cardiovascular testing.   Her Plavix may be held for 5 just 7 days prior to her surgery.  Please resume as soon as hemostasis is achieved.  I will route this recommendation to the requesting party via Epic fax function and remove from pre-op pool.  Please call with questions.  Jossie Ng. Timm Bonenberger NP-C    09/14/2021, [facility] [address] Office [phone] Fax [phone]

## 2021-09-15 ENCOUNTER — Other Ambulatory Visit: Payer: Self-pay

## 2021-09-15 ENCOUNTER — Encounter (HOSPITAL_BASED_OUTPATIENT_CLINIC_OR_DEPARTMENT_OTHER): Payer: Self-pay | Admitting: Otolaryngology

## 2021-09-15 DIAGNOSIS — E538 Deficiency of other specified B group vitamins: Secondary | ICD-10-CM

## 2021-09-19 DIAGNOSIS — Z0000 Encounter for general adult medical examination without abnormal findings: Secondary | ICD-10-CM | POA: Diagnosis not present

## 2021-09-20 ENCOUNTER — Encounter (HOSPITAL_BASED_OUTPATIENT_CLINIC_OR_DEPARTMENT_OTHER)
Admission: RE | Admit: 2021-09-20 | Discharge: 2021-09-20 | Disposition: A | Discharge status: Home / Self Care | Payer: Medicare Other | Source: Ambulatory Visit | Attending: Otolaryngology | Admitting: Otolaryngology

## 2021-09-20 DIAGNOSIS — Z01812 Encounter for preprocedural laboratory examination: Secondary | ICD-10-CM | POA: Insufficient documentation

## 2021-09-20 LAB — BASIC METABOLIC PANEL
Anion gap: 6 (ref 5–15)
BUN: 14 mg/dL (ref 8–23)
CO2: 30 mmol/L (ref 22–32)
Calcium: 8.9 mg/dL (ref 8.9–10.3)
Chloride: 101 mmol/L (ref 98–111)
Creatinine, Ser: 0.99 mg/dL (ref 0.44–1.00)
GFR, Estimated: 59 mL/min — ABNORMAL LOW (ref >60)
Glucose, Bld: 75 mg/dL (ref 70–99)
Potassium: 4.2 mmol/L (ref 3.5–5.1)
Sodium: 137 mmol/L (ref 135–145)

## 2021-09-21 ENCOUNTER — Other Ambulatory Visit: Payer: Self-pay

## 2021-09-21 ENCOUNTER — Telehealth: Payer: Self-pay

## 2021-09-21 DIAGNOSIS — E538 Deficiency of other specified B group vitamins: Secondary | ICD-10-CM | POA: Diagnosis not present

## 2021-09-21 NOTE — Telephone Encounter (Signed)
Called the patient to speak with her. My message was response to an issue that had already been addressed. Left her that message and my name is she has any questions.

## 2021-09-21 NOTE — Telephone Encounter (Signed)
Call to the PCP office to request a copy of her recent labs. Asked they be faxed to [phone] attention Beth.

## 2021-09-24 NOTE — H&P (Signed)
HPI:   [name] [name] is a 75 y.o. female who presents as a consult Patient.   Referring Provider: Adam Phenix, *  Chief complaint: Sinus problem.  HPI: Here today complaining of a 1 year history of left nasal congestion, green nasal discharge all from the left, with foul-smelling characteristic. The last time she took an antibiotic was about 6 months ago. She was on it for about 10 days and got slight relief but not complete. Otherwise the symptoms have maintained themselves. She has a history of maxillary advancement surgery many years ago. No history of sinus surgery. Recent CT scan reveals bilateral maxillary hardware, evidence of left antrostomy, reactive osteitis of the left maxillary sinus, partial opacification of the left sinus, mucosal thickening, and a polypoid soft tissue mass within the left antrum.  PMH/Meds/All/SocHx/FamHx/ROS:   History reviewed. No pertinent past medical history.  Past Surgical History:  Procedure Laterality Date   massectomy Bilateral   No family history of bleeding disorders, wound healing problems or difficulty with anesthesia.   Social History   Socioeconomic History   Marital status: Single  Spouse name: Not on file   Number of children: Not on file   Years of education: Not on file   Highest education level: Not on file  Occupational History   Not on file  Tobacco Use   Smoking status: Never Smoker   Smokeless tobacco: Never Used  Substance and Sexual Activity   Alcohol use: Yes   Drug use: Not on file   Sexual activity: Not on file  Other Topics Concern   Not on file  Social History Narrative   Not on file   Social Determinants of Health   Financial Resource Strain: Not on file  Food Insecurity: Not on file  Transportation Needs: Not on file  Physical Activity: Not on file  Stress: Not on file  Social Connections: Not on file  Housing Stability: Not on file   Current Outpatient Medications:   acetaminophen  (TYLENOL) 325 MG tablet, Take 650 mg by mouth., Disp: , Rfl:   albuterol 90 mcg/actuation inhaler, USE 2 PUFFS BY MOUTH EVERY FOUR HOURS AS NEEDED. SHAKE WELL, Disp: , Rfl:   aspirin 81 MG EC tablet *ANTIPLATELET*, Take 81 mg by mouth daily., Disp: , Rfl:   calcium carbonate (OS-CAL) 600 mg calcium (1,500 mg) tablet, Take 600 mg by mouth., Disp: , Rfl:   denosumab (PROLIA) 60 mg/mL Syrg, Inject 60 mg into the skin., Disp: , Rfl:   escitalopram oxalate (LEXAPRO) 10 MG tablet, Take 10 mg by mouth daily., Disp: , Rfl:   FLOVENT HFA 220 mcg/actuation inhaler, INHALE 1 PUFF TWICE DAILY AS DIRECTED., Disp: , Rfl:   hydroCHLOROthiazide (HYDRODIURIL) 25 MG tablet, Take 25 mg by mouth daily., Disp: , Rfl:   irbesartan (AVAPRO) 150 MG tablet, Take 150 mg by mouth daily., Disp: , Rfl:   levocetirizine (XYZAL) 5 MG tablet, Take 5 mg by mouth every evening., Disp: , Rfl:   metroNIDAZOLE (METROGEL) 0.75 % gel, Apply topically., Disp: , Rfl:   nebivoloL (BYSTOLIC) 2.5 MG tablet, Take 2.5 mg by mouth., Disp: , Rfl:   nitroglycerin (NITROSTAT) 0.4 MG SL tablet, Place 0.4 mg under the tongue., Disp: , Rfl:   potassium chloride ER (KLOR-CON-M, K-DUR) 20 MEQ extended release tablet, TAKE 1 TABLET TWICE DAILY WITH FOOD., Disp: , Rfl:   rosuvastatin (CRESTOR) 5 MG tablet, TAKE 1 TABLET ONCE DAILY. 90 days, Disp: , Rfl:   sulfamethoxazole-trimethoprim (BACTRIM,SEPTRA) 400-80 mg  per tablet, Take 1 tablet by mouth daily., Disp: , Rfl:   A complete ROS was performed with pertinent positives/negatives noted in the HPI. The remainder of the ROS are negative.   Physical Exam:   BP 141/74  Pulse 57  Temp 97.6 F (36.4 C)  Ht 1.499 m (4\' 11" )  Wt 60.5 kg (133 lb 6.4 oz)  BMI 26.94 kg/m   General: Healthy and alert, in no distress, breathing easily. Normal affect. In a pleasant mood. Head: Normocephalic, atraumatic. No masses, or scars. Eyes: Pupils are equal, and reactive to light. Vision is grossly intact. No  spontaneous or gaze nystagmus. Ears: Ear canals are clear. Tympanic membranes are intact, with normal landmarks and the middle ears are clear and healthy. Hearing: Grossly normal. Nose: Nasal cavities are clear with healthy mucosa, no polyps or exudate. Airways are patent. Polyps or exudate are not seen. Face: No masses or scars, facial nerve function is symmetric. She feels a very slight tenderness along the left maxilla with percussion. Oral Cavity: No mucosal abnormalities are noted. Tongue with normal mobility. Dentition appears healthy. Oropharynx: Tonsils are symmetric. There are no mucosal masses identified. Tongue base appears normal and healthy. Larynx/Hypopharynx: deferred Chest: Deferred Neck: No palpable masses, no cervical adenopathy, no thyroid nodules or enlargement. Neuro: Cranial nerves II-XII with normal function. Balance: Normal gate. Other findings: none.  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 Recommend treatment with 2 weeks of clindamycin. She is not able to tolerate yogurt so I would like for her to take a probiotic on a daily basis. She is instructed to contact us if she has any bad GI side effects. Otherwise we will see her back in 2 weeks and perform nasal endoscopy to get a closer look in that area posttreatment.

## 2021-09-25 ENCOUNTER — Ambulatory Visit (HOSPITAL_BASED_OUTPATIENT_CLINIC_OR_DEPARTMENT_OTHER): Payer: Medicare Other | Admitting: Certified Registered Nurse Anesthetist

## 2021-09-25 ENCOUNTER — Encounter (HOSPITAL_BASED_OUTPATIENT_CLINIC_OR_DEPARTMENT_OTHER): Payer: Self-pay | Admitting: Otolaryngology

## 2021-09-25 ENCOUNTER — Ambulatory Visit (HOSPITAL_BASED_OUTPATIENT_CLINIC_OR_DEPARTMENT_OTHER)
Admission: RE | Admit: 2021-09-25 | Discharge: 2021-09-25 | Disposition: A | Discharge status: Home / Self Care | Payer: Medicare Other | Attending: Otolaryngology | Admitting: Otolaryngology

## 2021-09-25 ENCOUNTER — Encounter (HOSPITAL_BASED_OUTPATIENT_CLINIC_OR_DEPARTMENT_OTHER)
Admission: RE | Disposition: A | Discharge status: Home / Self Care | Payer: Self-pay | Source: Home / Self Care | Attending: Otolaryngology

## 2021-09-25 ENCOUNTER — Other Ambulatory Visit: Payer: Self-pay

## 2021-09-25 DIAGNOSIS — I10 Essential (primary) hypertension: Secondary | ICD-10-CM | POA: Diagnosis not present

## 2021-09-25 DIAGNOSIS — Z7982 Long term (current) use of aspirin: Secondary | ICD-10-CM | POA: Insufficient documentation

## 2021-09-25 DIAGNOSIS — E876 Hypokalemia: Secondary | ICD-10-CM | POA: Diagnosis not present

## 2021-09-25 DIAGNOSIS — Z792 Long term (current) use of antibiotics: Secondary | ICD-10-CM | POA: Diagnosis not present

## 2021-09-25 DIAGNOSIS — Z7951 Long term (current) use of inhaled steroids: Secondary | ICD-10-CM | POA: Insufficient documentation

## 2021-09-25 DIAGNOSIS — B479 Mycetoma, unspecified: Secondary | ICD-10-CM | POA: Insufficient documentation

## 2021-09-25 DIAGNOSIS — J320 Chronic maxillary sinusitis: Secondary | ICD-10-CM | POA: Insufficient documentation

## 2021-09-25 DIAGNOSIS — Z79899 Other long term (current) drug therapy: Secondary | ICD-10-CM | POA: Diagnosis not present

## 2021-09-25 DIAGNOSIS — I2510 Atherosclerotic heart disease of native coronary artery without angina pectoris: Secondary | ICD-10-CM | POA: Diagnosis not present

## 2021-09-25 DIAGNOSIS — M869 Osteomyelitis, unspecified: Secondary | ICD-10-CM | POA: Diagnosis not present

## 2021-09-25 DIAGNOSIS — Z79620 Long term (current) use of immunosuppressive biologic: Secondary | ICD-10-CM | POA: Diagnosis not present

## 2021-09-25 HISTORY — PX: NASAL SINUS SURGERY: SHX719

## 2021-09-25 HISTORY — DX: Gastro-esophageal reflux disease without esophagitis: K21.9

## 2021-09-25 SURGERY — SINUS SURGERY, ENDOSCOPIC
Anesthesia: General | Site: Nose | Laterality: Left

## 2021-09-25 MED ORDER — LACTATED RINGERS IV SOLN: INTRAVENOUS | Status: DC

## 2021-09-25 MED ORDER — OXYMETAZOLINE HCL 0.05 % NA SOLN
NASAL | Status: AC
  Filled 2021-09-25: qty 30

## 2021-09-25 MED ORDER — FENTANYL CITRATE (PF) 100 MCG/2ML IJ SOLN
INTRAMUSCULAR | Status: AC
  Filled 2021-09-25: qty 2

## 2021-09-25 MED ORDER — SUCCINYLCHOLINE CHLORIDE 200 MG/10ML IV SOSY: PREFILLED_SYRINGE | INTRAVENOUS | Status: DC | PRN

## 2021-09-25 MED ORDER — OXYCODONE HCL 5 MG/5ML PO SOLN
5.0000 mg | Freq: Once | ORAL | Status: DC | PRN
Start: 2021-09-25 — End: 2021-09-25

## 2021-09-25 MED ORDER — HYDROMORPHONE HCL 1 MG/ML IJ SOLN: 0.2500 mg | INTRAMUSCULAR | Status: DC | PRN

## 2021-09-25 MED ORDER — PROMETHAZINE HCL 25 MG/ML IJ SOLN: 6.2500 mg | INTRAMUSCULAR | Status: DC | PRN

## 2021-09-25 MED ORDER — AMISULPRIDE (ANTIEMETIC) 5 MG/2ML IV SOLN: 10.0000 mg | Freq: Once | INTRAVENOUS | Status: DC | PRN

## 2021-09-25 MED ORDER — FENTANYL CITRATE (PF) 100 MCG/2ML IJ SOLN
INTRAMUSCULAR | Status: DC | PRN
  Administered 2021-09-25: 100 ug via INTRAVENOUS

## 2021-09-25 MED ORDER — ROCURONIUM BROMIDE 10 MG/ML (PF) SYRINGE
PREFILLED_SYRINGE | INTRAVENOUS | Status: AC
  Filled 2021-09-25: qty 10

## 2021-09-25 MED ORDER — LIDOCAINE 2% (20 MG/ML) 5 ML SYRINGE
INTRAMUSCULAR | Status: AC
  Filled 2021-09-25: qty 5

## 2021-09-25 MED ORDER — PROPOFOL 10 MG/ML IV BOLUS
INTRAVENOUS | Status: DC | PRN
  Administered 2021-09-25: 120 mg via INTRAVENOUS

## 2021-09-25 MED ORDER — OXYCODONE HCL 5 MG PO TABS: 5.0000 mg | ORAL_TABLET | Freq: Once | ORAL | Status: DC | PRN

## 2021-09-25 MED ORDER — OXYMETAZOLINE HCL 0.05 % NA SOLN
2.0000 | NASAL | Status: DC
Start: 2021-09-25 — End: 2021-09-25
  Administered 2021-09-25: 2 via NASAL

## 2021-09-25 MED ORDER — PROPOFOL 10 MG/ML IV BOLUS
INTRAVENOUS | Status: AC
  Filled 2021-09-25: qty 20

## 2021-09-25 MED ORDER — OXYMETAZOLINE HCL 0.05 % NA SOLN
NASAL | Status: DC | PRN
  Administered 2021-09-25: 1 via TOPICAL

## 2021-09-25 MED ORDER — SUGAMMADEX SODIUM 200 MG/2ML IV SOLN
INTRAVENOUS | Status: DC | PRN
  Administered 2021-09-25: 200 mg via INTRAVENOUS

## 2021-09-25 MED ORDER — DEXAMETHASONE SODIUM PHOSPHATE 4 MG/ML IJ SOLN
INTRAMUSCULAR | Status: DC | PRN
  Administered 2021-09-25: 8 mg via INTRAVENOUS

## 2021-09-25 MED ORDER — LIDOCAINE HCL (CARDIAC) PF 100 MG/5ML IV SOSY
PREFILLED_SYRINGE | INTRAVENOUS | Status: DC | PRN
  Administered 2021-09-25: 60 mg via INTRAVENOUS

## 2021-09-25 MED ORDER — MIDAZOLAM HCL 5 MG/5ML IJ SOLN
INTRAMUSCULAR | Status: DC | PRN
  Administered 2021-09-25: .5 mg via INTRAVENOUS

## 2021-09-25 MED ORDER — ROCURONIUM BROMIDE 100 MG/10ML IV SOLN
INTRAVENOUS | Status: DC | PRN
  Administered 2021-09-25: 60 mg via INTRAVENOUS

## 2021-09-25 MED ORDER — ONDANSETRON HCL 4 MG/2ML IJ SOLN
INTRAMUSCULAR | Status: DC | PRN
  Administered 2021-09-25: 4 mg via INTRAVENOUS

## 2021-09-25 MED ORDER — MIDAZOLAM HCL 2 MG/2ML IJ SOLN
INTRAMUSCULAR | Status: AC
  Filled 2021-09-25: qty 2

## 2021-09-25 MED ORDER — ONDANSETRON HCL 4 MG/2ML IJ SOLN
INTRAMUSCULAR | Status: AC
  Filled 2021-09-25: qty 2

## 2021-09-25 MED ORDER — DEXAMETHASONE SODIUM PHOSPHATE 10 MG/ML IJ SOLN
INTRAMUSCULAR | Status: AC
  Filled 2021-09-25: qty 1

## 2021-09-25 SURGICAL SUPPLY — 42 items
ATTRACTOMAT 16X20 MAGNETIC DRP (DRAPES) IMPLANT
BLADE RAD40 ROTATE 4M 4 5PK (BLADE) IMPLANT
BLADE RAD60 ROTATE M4 4 5PK (BLADE) IMPLANT
BLADE TRICUT ROTATE M4 4 5PK (BLADE) IMPLANT
BUR HS RAD FRONTAL 3 (BURR) IMPLANT
CANISTER SUC SOCK COL 7IN (MISCELLANEOUS) ×2 IMPLANT
CANISTER SUCT 1200ML W/VALVE (MISCELLANEOUS) ×3 IMPLANT
CORD BIPOLAR FORCEPS 12FT (ELECTRODE) IMPLANT
DECANTER SPIKE VIAL GLASS SM (MISCELLANEOUS) IMPLANT
DEFOGGER MIRROR 1QT (MISCELLANEOUS) ×2 IMPLANT
DRESSING NASAL KENNEDY 3.5X.9 (MISCELLANEOUS) IMPLANT
DRSG CURAD 3X16 NADH (PACKING) IMPLANT
DRSG NASAL KENNEDY 3.5X.9 (MISCELLANEOUS)
DRSG NASAL KENNEDY LMNT 8CM (GAUZE/BANDAGES/DRESSINGS) IMPLANT
DRSG NASOPORE 8CM (GAUZE/BANDAGES/DRESSINGS) ×1 IMPLANT
DRSG TELFA 3X8 NADH (GAUZE/BANDAGES/DRESSINGS) IMPLANT
FORCEPS BIPOLAR SPETZLER 8 1.0 (NEUROSURGERY SUPPLIES) IMPLANT
GAUZE 4X4 16PLY ~~LOC~~+RFID DBL (SPONGE) ×1 IMPLANT
GAUZE VASELINE FOILPK 1/2 X 72 (GAUZE/BANDAGES/DRESSINGS) IMPLANT
GLOVE SURG LTX SZ7.5 (GLOVE) ×2 IMPLANT
GLOVE SURG POLYISO LF SZ6.5 (GLOVE) ×1 IMPLANT
GLOVE SURG UNDER POLY LF SZ7 (GLOVE) ×1 IMPLANT
GOWN STRL REUS W/ TWL LRG LVL3 (GOWN DISPOSABLE) ×2 IMPLANT
GOWN STRL REUS W/TWL LRG LVL3 (GOWN DISPOSABLE) ×4
HEMOSTAT SURGICEL .5X2 ABSORB (HEMOSTASIS) IMPLANT
IV NS 500ML (IV SOLUTION)
IV NS 500ML BAXH (IV SOLUTION) IMPLANT
NDL HYPO 27GX1-1/4 (NEEDLE) ×1 IMPLANT
NDL SPNL 25GX3.5 QUINCKE BL (NEEDLE) IMPLANT
NEEDLE HYPO 27GX1-1/4 (NEEDLE) ×2 IMPLANT
NEEDLE SPNL 25GX3.5 QUINCKE BL (NEEDLE) IMPLANT
NS IRRIG 1000ML POUR BTL (IV SOLUTION) ×2 IMPLANT
PACK BASIN DAY SURGERY FS (CUSTOM PROCEDURE TRAY) ×2 IMPLANT
PACK ENT DAY SURGERY (CUSTOM PROCEDURE TRAY) ×2 IMPLANT
PAD DRESSING TELFA 3X8 NADH (GAUZE/BANDAGES/DRESSINGS) IMPLANT
PATTIES SURGICAL .5 X3 (DISPOSABLE) ×2 IMPLANT
SLEEVE SCD COMPRESS KNEE MED (STOCKING) ×2 IMPLANT
SPONGE GAUZE 2X2 8PLY STRL LF (GAUZE/BANDAGES/DRESSINGS) ×2 IMPLANT
SPONGE SURGIFOAM ABS GEL 12-7 (HEMOSTASIS) IMPLANT
TOWEL GREEN STERILE FF (TOWEL DISPOSABLE) ×2 IMPLANT
TUBE CONNECTING 20X1/4 (TUBING) IMPLANT
YANKAUER SUCT BULB TIP NO VENT (SUCTIONS) ×2 IMPLANT

## 2021-09-25 NOTE — Op Note (Signed)
OPERATIVE REPORT  DATE OF SURGERY: 09/25/2021  PATIENT:  [name] [name],  75 y.o. female  PRE-OPERATIVE DIAGNOSIS:  Chronic maxillary sinusitis  POST-OPERATIVE DIAGNOSIS:  Chronic maxillary sinusitis  PROCEDURE:  Procedure(s): LEFT ENDOSCOPIC MAXILLARY ANTROSTOMY  SURGEON:  Beckie Salts, MD  ASSISTANTS: None  ANESTHESIA:   General   EBL: Less than 20 ml  DRAINS: None  LOCAL MEDICATIONS USED:  None  SPECIMEN: Left maxillary sinus and nasal contents  COUNTS:  Correct  PROCEDURE DETAILS: The patient was taken to the operating room and placed on the operating table in the supine position. Following induction of general endotracheal anesthesia, the face was draped in the standard fashion for sinus surgery.  Oxymetazoline spray was used preoperatively in the nasal cavities.  A 0 and 30 degree nasal endoscope was used throughout the procedure.  Starting with the 0 degree scope and a straight suction the nasal cavity was inspected and a large fungus ball was identified contiguous with the nasal cavity and the maxillary antrum.  This was removed using combination of suction and angled curettes.  The entire fungus ball was removed and the majority of it was sent for pathologic valuation.  The antrostomy was largely patent.  The maxillary sinus was irrigated with saline using a curved suction and then all irrigant was suctioned out of the nasal and sinus cavities in the pharynx.  An additional piece of fungus was retrieved from the nasopharynx as well.  No other abnormal findings were identified.  Afrin pledgets were placed in the left nasal cavity and removed after extubation.  Patient was awakened extubated and transferred to recovery in stable condition.    PATIENT DISPOSITION:  To PACU, stable

## 2021-09-25 NOTE — Discharge Instructions (Addendum)
Use saline nasal spray every hour while awake.  Do this for about 2 weeks and then you may resume normal irrigations  Okay to use Tylenol and/or Motrin as needed for pain.    Post Anesthesia Home Care Instructions  Activity: Get plenty of rest for the remainder of the day. A responsible individual must stay with you for 24 hours following the procedure.  For the next 24 hours, DO NOT: -Drive a car -Paediatric nurse -Drink alcoholic beverages -Take any medication unless instructed by your physician -Make any legal decisions or sign important papers.  Meals: Start with liquid foods such as gelatin or soup. Progress to regular foods as tolerated. Avoid greasy, spicy, heavy foods. If nausea and/or vomiting occur, drink only clear liquids until the nausea and/or vomiting subsides. Call your physician if vomiting continues.  Special Instructions/Symptoms: Your throat may feel dry or sore from the anesthesia or the breathing tube placed in your throat during surgery. If this causes discomfort, gargle with warm salt water. The discomfort should disappear within 24 hours.  If you had a scopolamine patch placed behind your ear for the management of post- operative nausea and/or vomiting:  1. The medication in the patch is effective for 72 hours, after which it should be removed.  Wrap patch in a tissue and discard in the trash. Wash hands thoroughly with soap and water. 2. You may remove the patch earlier than 72 hours if you experience unpleasant side effects which may include dry mouth, dizziness or visual disturbances. 3. Avoid touching the patch. Wash your hands with soap and water after contact with the patch.

## 2021-09-25 NOTE — Interval H&P Note (Signed)
History and Physical Interval Note:  09/25/2021 11:05 AM  [name] [name]  has presented today for surgery, with the diagnosis of Chronic maxillary sinusitis.  The various methods of treatment have been discussed with the patient and family. After consideration of risks, benefits and other options for treatment, the patient has consented to  Procedure(s): LEFT ENDOSCOPIC MAXILLARY ANTROSTOMY (Left) as a surgical intervention.  The patient's history has been reviewed, patient examined, no change in status, stable for surgery.  I have reviewed the patient's chart and labs.  Questions were answered to the patient's satisfaction.     [name]

## 2021-09-25 NOTE — Anesthesia Procedure Notes (Signed)
Procedure Name: Intubation Date/Time: 09/25/2021 11:47 AM Performed by: Bufford Spikes, CRNA Pre-anesthesia Checklist: Patient identified, Emergency Drugs available, Suction available and Patient being monitored Patient Re-evaluated:Patient Re-evaluated prior to induction Oxygen Delivery Method: Circle system utilized Preoxygenation: Pre-oxygenation with 100% oxygen Induction Type: IV induction Ventilation: Mask ventilation without difficulty Laryngoscope Size: Mac and 3 Grade View: Grade I Tube type: Oral Rae Tube size: 7.0 mm Number of attempts: 1 Airway Equipment and Method: Stylet and Oral airway Placement Confirmation: ETT inserted through vocal cords under direct vision, positive ETCO2 and breath sounds checked- equal and bilateral Tube secured with: Tape Dental Injury: Teeth and Oropharynx as per pre-operative assessment

## 2021-09-25 NOTE — Transfer of Care (Signed)
Immediate Anesthesia Transfer of Care Note  Patient: [name] [name]  Procedure(s) Performed: LEFT ENDOSCOPIC MAXILLARY ANTROSTOMY (Left: Nose)  Patient Location: PACU  Anesthesia Type:General  Level of Consciousness: awake, alert  and oriented  Airway & Oxygen Therapy: Patient Spontanous Breathing and Patient connected to nasal cannula oxygen  Post-op Assessment: Report given to RN and Post -op Vital signs reviewed and stable  Post vital signs: Reviewed and stable  Last Vitals:  Vitals Value Taken Time  BP 176/84 09/25/21 1218  Temp 36.8 C 09/25/21 1218  Pulse 77 09/25/21 1222  Resp 21 09/25/21 1222  SpO2 96 % 09/25/21 1222  Vitals shown include unvalidated device data.  Last Pain:  Vitals:   09/25/21 1218  TempSrc:   PainSc: 3       Patients Stated Pain Goal: 3 (61/22/44 9753)  Complications: No notable events documented.

## 2021-09-25 NOTE — Anesthesia Postprocedure Evaluation (Signed)
Anesthesia Post Note  Patient: KARLEI WALDO  Procedure(s) Performed: LEFT ENDOSCOPIC MAXILLARY ANTROSTOMY (Left: Nose)     Patient location during evaluation: PACU Anesthesia Type: General Level of consciousness: awake and alert Pain management: pain level controlled Vital Signs Assessment: post-procedure vital signs reviewed and stable Respiratory status: spontaneous breathing, nonlabored ventilation and respiratory function stable Cardiovascular status: blood pressure returned to baseline and stable Postop Assessment: no apparent nausea or vomiting Anesthetic complications: no   No notable events documented.  Last Vitals:  Vitals:   09/25/21 1244 09/25/21 1311  BP: (!) 154/70 (!) 150/75  Pulse: 62 62  Resp: 13 20  Temp:  (!) 36.3 C  SpO2: 95% 93%    Last Pain:  Vitals:   09/25/21 1311  TempSrc: Oral  PainSc: 1                  Lynda Rainwater

## 2021-09-25 NOTE — Anesthesia Preprocedure Evaluation (Signed)
Anesthesia Evaluation  Patient identified by MRN, date of birth, ID band Patient awake    Reviewed: Allergy & Precautions, NPO status , Patient's Chart, lab work & pertinent test results  Airway Mallampati: II  TM Distance: >3 FB Neck ROM: Full    Dental no notable dental hx.    Pulmonary asthma , former smoker,    Pulmonary exam normal breath sounds clear to auscultation       Cardiovascular hypertension, + CAD, + Past MI and + Cardiac Stents  Normal cardiovascular exam Rhythm:Regular Rate:Normal     Neuro/Psych Anxiety negative neurological ROS     GI/Hepatic Neg liver ROS, GERD  ,  Endo/Other  negative endocrine ROS  Renal/GU negative Renal ROS  negative genitourinary   Musculoskeletal negative musculoskeletal ROS (+)   Abdominal   Peds negative pediatric ROS (+)  Hematology  (+) anemia ,   Anesthesia Other Findings   Reproductive/Obstetrics negative OB ROS                             Anesthesia Physical  Anesthesia Plan  ASA: 3  Anesthesia Plan: General   Post-op Pain Management:    Induction: Intravenous  PONV Risk Score and Plan: 3 and Treatment may vary due to age or medical condition, Ondansetron, Dexamethasone and Midazolam  Airway Management Planned: Oral ETT  Additional Equipment:   Intra-op Plan:   Post-operative Plan: Extubation in OR  Informed Consent: I have reviewed the patients History and Physical, chart, labs and discussed the procedure including the risks, benefits and alternatives for the proposed anesthesia with the patient or authorized representative who has indicated his/her understanding and acceptance.     Dental advisory given  Plan Discussed with: CRNA and Surgeon  Anesthesia Plan Comments:         Anesthesia Quick Evaluation

## 2021-09-26 ENCOUNTER — Encounter (HOSPITAL_BASED_OUTPATIENT_CLINIC_OR_DEPARTMENT_OTHER): Payer: Self-pay | Admitting: Otolaryngology

## 2021-09-26 LAB — SURGICAL PATHOLOGY

## 2021-10-13 ENCOUNTER — Ambulatory Visit (INDEPENDENT_AMBULATORY_CARE_PROVIDER_SITE_OTHER): Payer: Medicare Other | Admitting: Gastroenterology

## 2021-10-13 ENCOUNTER — Ambulatory Visit: Payer: Medicare Other | Admitting: Gastroenterology

## 2021-10-13 ENCOUNTER — Other Ambulatory Visit (INDEPENDENT_AMBULATORY_CARE_PROVIDER_SITE_OTHER): Payer: Medicare Other

## 2021-10-13 ENCOUNTER — Encounter: Payer: Self-pay | Admitting: Gastroenterology

## 2021-10-13 VITALS — BP 118/70 | HR 64 | Ht 61.0 in | Wt 132.1 lb

## 2021-10-13 DIAGNOSIS — Z8711 Personal history of peptic ulcer disease: Secondary | ICD-10-CM

## 2021-10-13 DIAGNOSIS — E538 Deficiency of other specified B group vitamins: Secondary | ICD-10-CM | POA: Diagnosis not present

## 2021-10-13 DIAGNOSIS — K219 Gastro-esophageal reflux disease without esophagitis: Secondary | ICD-10-CM | POA: Diagnosis not present

## 2021-10-13 DIAGNOSIS — D509 Iron deficiency anemia, unspecified: Secondary | ICD-10-CM

## 2021-10-13 LAB — IBC + FERRITIN
Ferritin: 11 ng/mL (ref 10.0–291.0)
Iron: 73 ug/dL (ref 42–145)
Saturation Ratios: 19.3 % — ABNORMAL LOW (ref 20.0–50.0)
TIBC: 378 ug/dL (ref 250.0–450.0)
Transferrin: 270 mg/dL (ref 212.0–360.0)

## 2021-10-13 LAB — CBC WITH DIFFERENTIAL/PLATELET
Basophils Absolute: 0 10*3/uL (ref 0.0–0.1)
Basophils Relative: 1 % (ref 0.0–3.0)
Eosinophils Absolute: 0.1 10*3/uL (ref 0.0–0.7)
Eosinophils Relative: 1.3 % (ref 0.0–5.0)
HCT: 40.6 % (ref 36.0–46.0)
Hemoglobin: 13.5 g/dL (ref 12.0–15.0)
Lymphocytes Relative: 14.9 % (ref 12.0–46.0)
Lymphs Abs: 0.8 10*3/uL (ref 0.7–4.0)
MCHC: 33.3 g/dL (ref 30.0–36.0)
MCV: 90.3 fl (ref 78.0–100.0)
Monocytes Absolute: 0.5 10*3/uL (ref 0.1–1.0)
Monocytes Relative: 10.5 % (ref 3.0–12.0)
Neutro Abs: 3.7 10*3/uL (ref 1.4–7.7)
Neutrophils Relative %: 72.3 % (ref 43.0–77.0)
Platelets: 301 10*3/uL (ref 150.0–400.0)
RBC: 4.49 Mil/uL (ref 3.87–5.11)
RDW: 14.7 % (ref 11.5–15.5)
WBC: 5.1 10*3/uL (ref 4.0–10.5)

## 2021-10-13 LAB — VITAMIN B12: Vitamin B-12: >1550 pg/mL — ABNORMAL HIGH (ref 211–911)

## 2021-10-13 NOTE — Progress Notes (Signed)
[name] [name]    [id number]    1946/07/08  Primary Care Physician:McNeill, Abigail Butts, MD  Referring Physician: Cari Caraway, [place],  [place] [id number]   Chief complaint:  GERD, gastric ulcer, anemia  HPI: 75 year old very pleasant female here for follow-up visit for anemia, gastritis and gastric ulcers Overall she is doing well and has no specific GI complaints. Denies any nausea, vomiting, abdominal pain, melena or bright red blood per rectum   EGD 08/23/21 - LA Grade B (one or more mucosal breaks greater than 5 mm, not extending between the tops of two mucosal folds) esophagitis with no bleeding was found 34 to 35 cm from the incisors. - The exam of the esophagus was otherwise normal. - One non-bleeding cratered gastric ulcer with no stigmata of bleeding was found in the gastric antrum. The lesion was 6 mm in largest dimension. Biopsies were taken with a cold forceps for histology. - A single 15 mm pedunculated polyp with no bleeding and no stigmata of recent bleeding was found in the cardia. Biopsies were taken with a cold forceps for histology. - The examined duodenum was normal  Colonoscopy May 02, 2016 - One 3 mm polyp in the ascending colon, removed with a cold biopsy forceps. Resected and retrieved. - Severe Diverticulosis in the entire examined colon. - Non-bleeding internal hemorrhoids.   Outpatient Encounter Medications as of 10/13/2021  Medication Sig   acetaminophen (TYLENOL) 325 MG tablet Take 650 mg by mouth every 6 (six) hours as needed for mild pain.    albuterol (VENTOLIN HFA) 108 (90 Base) MCG/ACT inhaler Inhale 2 puffs into the lungs every 4 (four) hours as needed for shortness of breath.   clopidogrel (PLAVIX) 75 MG tablet Take 1 tablet (75 mg total) by mouth daily.   Cyanocobalamin (B-12 COMPLIANCE INJECTION IJ) Inject 1 mL as directed every 30 (thirty) days.   denosumab (PROLIA) 60 MG/ML SOSY injection Inject 60 mg  into the skin every 6 (six) months.   escitalopram (LEXAPRO) 10 MG tablet Take 10 mg by mouth daily.   Ferrous Sulfate (IRON SUPPLEMENT PO) Take 18 mg by mouth in the morning and at bedtime.   FLOVENT HFA 220 MCG/ACT inhaler Inhale 1 puff into the lungs 2 (two) times daily.   fluticasone (FLONASE) 50 MCG/ACT nasal spray Place 1 spray into both nostrils 2 (two) times daily.   hydrochlorothiazide (HYDRODIURIL) 25 MG tablet Take 1 tablet (25 mg total) by mouth daily.   irbesartan (AVAPRO) 150 MG tablet Take 150 mg by mouth daily.   ketotifen (ZADITOR) 0.025 % ophthalmic solution Place 1 drop into both eyes 2 (two) times daily as needed (allergies).   nebivolol (BYSTOLIC) 2.5 MG tablet Take 1 tablet (2.5 mg total) by mouth daily.   pantoprazole (PROTONIX) 40 MG tablet Take 1 tablet (40 mg total) by mouth 2 (two) times daily.   polyvinyl alcohol (LIQUIFILM TEARS) 1.4 % ophthalmic solution Place 1 drop into both eyes as needed for dry eyes.   Potassium Chloride ER 20 MEQ TBCR Take 20 mEq by mouth 2 (two) times daily.   PREMARIN vaginal cream Place 0.5 Applicatorfuls vaginally 2 (two) times a week. On Tuesday and Saturday     Use as directed   rosuvastatin (CRESTOR) 5 MG tablet Take 1 tablet (5 mg total) by mouth daily.   Facility-Administered Encounter Medications as of 10/13/2021  Medication   0.9 %  sodium chloride infusion  Allergies as of 10/13/2021 - Review Complete 10/13/2021  Allergen Reaction Noted   Clindamycin Diarrhea and Nausea And Vomiting 06/21/2021   Asa [aspirin] Other (See Comments) 09/15/2021   Codeine Other (See Comments) 02/09/2014   Lisinopril Cough 02/28/2011   Simvastatin Diarrhea 02/28/2011    Past Medical History:  Diagnosis Date   Allergic rhinitis    Anemia    Anxiety disorder    Asthma    Blood transfusion without reported diagnosis    Breast cancer ([place])    bilateral   CAD (coronary artery disease)    a. NSTEMI 8/04 => LHC 07/2003:  mLAD hazy 75% =>  Taxus DES, EF 30% with ant-apical, apical and inf-apical AK.  b.  Echo 4/05: EF 60%.  c.  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   Positive PPD    PVC (premature ventricular contraction)     Past Surgical History:  Procedure Laterality Date   APPENDECTOMY     bilateral mastectomy     BIOPSY  06/21/2021   Procedure: BIOPSY;  Surgeon: Yetta Flock, MD;  Location: [facility];  Service: Gastroenterology;;   CARDIOVASCULAR STRESS TEST  07/01/2006   ef 80%   CORONARY ANGIOPLASTY WITH STENT PLACEMENT  2004   LAD   ESOPHAGOGASTRODUODENOSCOPY N/A 06/21/2021   Procedure: ESOPHAGOGASTRODUODENOSCOPY (EGD);  Surgeon: Yetta Flock, MD;  Location: [facility];  Service: Gastroenterology;  Laterality: N/A;   NASAL SINUS SURGERY Left 09/25/2021   Procedure: LEFT ENDOSCOPIC MAXILLARY ANTROSTOMY;  Surgeon: [name], MD;  Location: [place];  Service: ENT;  Laterality: Left;   US ECHOCARDIOGRAPHY  03/27/2004   resting ef 60%    Family History  Problem Relation Age of Onset   Breast cancer Mother    Breast cancer Sister    Heart disease Maternal Grandmother    Asthma Daughter    Spina bifida Other    Colon cancer Neg Hx    Esophageal cancer Neg Hx    Rectal cancer Neg Hx    Stomach cancer Neg Hx     Social History   Socioeconomic History   Marital status: Divorced    Spouse name: Not on file   Number of children: Not on file   Years of education: Not on file   Highest education level: Not on file  Occupational History   Occupation: retired  Tobacco Use   Smoking status: Former    Types: Cigarettes     Quit date: 08/07/1966    Years since quitting: 55.2   Smokeless tobacco: Never  Vaping Use   Vaping Use: Never used  Substance and Sexual Activity   Alcohol use: Not Currently   Drug use: No   Sexual activity: Not Currently    Birth control/protection: Post-menopausal  Other Topics Concern   Not on file  Social History Narrative   Not on file   Social Determinants of Health   Financial Resource Strain: Not on file  Food Insecurity: Not on file  Transportation Needs: Not on file  Physical Activity: Not on file  Stress: Not on file  Social Connections: Not on file  Intimate Partner Violence:  Not on file      Review of systems: All other review of systems negative except as mentioned in the HPI.   Physical Exam: Vitals:   10/13/21 1100  BP: 118/70  Pulse: 64   Body mass index is 24.96 kg/m. Gen:      No acute distress Neuro: alert and oriented x 3 Psych: normal mood and affect  Data Reviewed:  Reviewed labs, radiology imaging, old records and pertinent past GI work up   Assessment and Plan/Recommendations:  75 year old very pleasant female with history of CAD, hypertension, iron deficiency anemia in the setting of acute erosive gastritis and gastric ulcers Gastric biopsies negative for dysplasia or H. pylori bacterial infection, benign hyperplastic gastric polyp. Will decrease the dose of pantoprazole to 40 mg daily, advised patient to take 30 minutes before breakfast Follow-up CBC, iron panel with ferritin for iron deficiency anemia  B12 deficiency: Continue B12 injections.  Recheck B12 level in 6 to 12 months.  Return in 6 months   This visit required 30 minutes of patient care (this includes precharting, chart review, review of results, face-to-face time used for counseling as well as treatment plan and follow-up. The patient was provided an opportunity to ask questions and all were answered. The patient agreed with the plan and demonstrated an  understanding of the instructions.  Damaris Hippo , MD    CC: Cari Caraway, MD

## 2021-10-13 NOTE — Patient Instructions (Addendum)
Your provider has requested that you go to the basement level for lab work before leaving today. Press "B" on the elevator. The lab is located at the first door on the left as you exit the elevator.   Decrease Pantoprazole to 40 mg daily before breakfast   Follow up in 6 months  Due to recent changes in healthcare laws, you may see the results of your imaging and laboratory studies on MyChart before your provider has had a chance to review them.  We understand that in some cases there may be results that are confusing or concerning to you. Not all laboratory results come back in the same time frame and the provider may be waiting for multiple results in order to interpret others.  Please give [place] 48 hours in order for your provider to thoroughly review all the results before contacting the office for clarification of your results.    If you are age 38 or older, your body mass index should be between 23-30. Your Body mass index is 24.96 kg/m. If this is out of the aforementioned range listed, please consider follow up with your Primary Care Provider.  If you are age 79 or younger, your body mass index should be between 19-25. Your Body mass index is 24.96 kg/m. If this is out of the aformentioned range listed, please consider follow up with your Primary Care Provider.   ________________________________________________________  The [facility] providers would like to encourage you to use [facility] to communicate with providers for non-urgent requests or questions.  Due to long hold times on the telephone, sending your provider a message by [facility] may be a faster and more efficient way to get a response.  Please allow 48 business hours for a response.  Please remember that this is for non-urgent requests.  _______________________________________________________   I appreciate the  opportunity to care for you  Thank You   Harl Bowie , MD

## 2021-10-20 ENCOUNTER — Encounter: Payer: Self-pay | Admitting: Gastroenterology

## 2021-10-30 ENCOUNTER — Encounter: Payer: Self-pay | Admitting: Gastroenterology

## 2021-11-03 DIAGNOSIS — H52203 Unspecified astigmatism, bilateral: Secondary | ICD-10-CM | POA: Diagnosis not present

## 2021-11-03 DIAGNOSIS — Z961 Presence of intraocular lens: Secondary | ICD-10-CM | POA: Diagnosis not present

## 2021-11-03 DIAGNOSIS — H26493 Other secondary cataract, bilateral: Secondary | ICD-10-CM | POA: Diagnosis not present

## 2021-11-03 DIAGNOSIS — H1789 Other corneal scars and opacities: Secondary | ICD-10-CM | POA: Diagnosis not present

## 2021-12-03 DIAGNOSIS — R051 Acute cough: Secondary | ICD-10-CM | POA: Diagnosis not present

## 2021-12-03 DIAGNOSIS — Z03818 Encounter for observation for suspected exposure to other biological agents ruled out: Secondary | ICD-10-CM | POA: Diagnosis not present

## 2021-12-03 DIAGNOSIS — R062 Wheezing: Secondary | ICD-10-CM | POA: Diagnosis not present

## 2021-12-03 DIAGNOSIS — R0602 Shortness of breath: Secondary | ICD-10-CM | POA: Diagnosis not present

## 2021-12-07 DIAGNOSIS — J4540 Moderate persistent asthma, uncomplicated: Secondary | ICD-10-CM | POA: Diagnosis not present

## 2021-12-07 DIAGNOSIS — B338 Other specified viral diseases: Secondary | ICD-10-CM | POA: Diagnosis not present

## 2022-01-15 DIAGNOSIS — M810 Age-related osteoporosis without current pathological fracture: Secondary | ICD-10-CM | POA: Diagnosis not present

## 2022-01-26 ENCOUNTER — Encounter: Payer: Self-pay | Admitting: Gastroenterology

## 2022-01-26 ENCOUNTER — Other Ambulatory Visit (INDEPENDENT_AMBULATORY_CARE_PROVIDER_SITE_OTHER): Payer: Medicare Other

## 2022-01-26 ENCOUNTER — Ambulatory Visit: Payer: Medicare Other | Admitting: Gastroenterology

## 2022-01-26 VITALS — BP 138/82 | HR 67 | Ht 61.0 in | Wt 133.5 lb

## 2022-01-26 DIAGNOSIS — Z8719 Personal history of other diseases of the digestive system: Secondary | ICD-10-CM | POA: Diagnosis not present

## 2022-01-26 DIAGNOSIS — Z8711 Personal history of peptic ulcer disease: Secondary | ICD-10-CM

## 2022-01-26 DIAGNOSIS — D509 Iron deficiency anemia, unspecified: Secondary | ICD-10-CM

## 2022-01-26 LAB — IBC + FERRITIN
Ferritin: 16.9 ng/mL (ref 10.0–291.0)
Iron: 88 ug/dL (ref 42–145)
Saturation Ratios: 26.9 % (ref 20.0–50.0)
TIBC: 327.6 ug/dL (ref 250.0–450.0)
Transferrin: 234 mg/dL (ref 212.0–360.0)

## 2022-01-26 LAB — HEMOGLOBIN: Hemoglobin: 13.9 g/dL (ref 12.0–15.0)

## 2022-01-26 LAB — HEMATOCRIT: HCT: 42.3 % (ref 36.0–46.0)

## 2022-01-26 MED ORDER — PANTOPRAZOLE SODIUM 40 MG PO TBEC: 40.0000 mg | DELAYED_RELEASE_TABLET | Freq: Every day | ORAL | 3 refills | Status: DC

## 2022-01-26 NOTE — Progress Notes (Signed)
[name] [name]    [id number]    [birth year]  Primary Care Physician:McNeill, Abigail Butts, MD  Referring Physician: Cari Caraway, [place],  [place] [address]   Chief complaint: Iron deficiency anemia  HPI:  76 year old very pleasant female here for follow-up visit for anemia, gastritis and gastric ulcers Overall she is doing well and has no specific GI complaints. She is taking oral iron and is experiencing intermittent constipation  Denies any nausea, vomiting, abdominal pain, melena or bright red blood per rectum     EGD 08/23/21 - LA Grade B (one or more mucosal breaks greater than 5 mm, not extending between the tops of two mucosal folds) esophagitis with no bleeding was found 34 to 35 cm from the incisors. - The exam of the esophagus was otherwise normal. - One non-bleeding cratered gastric ulcer with no stigmata of bleeding was found in the gastric antrum. The lesion was 6 mm in largest dimension. Biopsies were taken with a cold forceps for histology. - A single 15 mm pedunculated polyp with no bleeding and no stigmata of recent bleeding was found in the cardia. Biopsies were taken with a cold forceps for histology. - The examined duodenum was normal   Colonoscopy May 02, 2016 - One 3 mm polyp (Tubular adenoma) in the ascending colon, removed with a cold biopsy forceps. Resected and retrieved. - Severe Diverticulosis in the entire examined colon. - Non-bleeding internal hemorrhoids.  CT abd & pelvis 06/20/21 1. Equivocal minor wall thickening of the sigmoid colon in the region of multiple colonic diverticula. This may represent minimal colitis, but no focally inflamed diverticulum to suggest diverticulitis. No CT findings to suggest C diff colitis. 2. No other acute abnormality in the abdomen/pelvis. 3. Nonobstructing left renal stone. Bilateral parapelvic cysts.  Outpatient Encounter Medications as of 01/26/2022  Medication Sig    acetaminophen (TYLENOL) 325 MG tablet Take 650 mg by mouth every 6 (six) hours as needed for mild pain.    albuterol (VENTOLIN HFA) 108 (90 Base) MCG/ACT inhaler Inhale 2 puffs into the lungs every 4 (four) hours as needed for shortness of breath.   clopidogrel (PLAVIX) 75 MG tablet Take 1 tablet (75 mg total) by mouth daily.   Cyanocobalamin (B-12 COMPLIANCE INJECTION IJ) Inject 1 mL as directed every 30 (thirty) days.   denosumab (PROLIA) 60 MG/ML SOSY injection Inject 60 mg into the skin every 6 (six) months.   escitalopram (LEXAPRO) 10 MG tablet Take 10 mg by mouth daily.   Ferrous Sulfate (IRON SUPPLEMENT PO) Take 18 mg by mouth in the morning and at bedtime.   FLOVENT HFA 220 MCG/ACT inhaler Inhale 1 puff into the lungs 2 (two) times daily.   fluticasone (FLONASE) 50 MCG/ACT nasal spray Place 1 spray into both nostrils 2 (two) times daily.   hydrochlorothiazide (HYDRODIURIL) 25 MG tablet Take 1 tablet (25 mg total) by mouth daily.   irbesartan (AVAPRO) 150 MG tablet Take 150 mg by mouth daily.   ketotifen (ZADITOR) 0.025 % ophthalmic solution Place 1 drop into both eyes 2 (two) times daily as needed (allergies).   nebivolol (BYSTOLIC) 2.5 MG tablet Take 1 tablet (2.5 mg total) by mouth daily.   pantoprazole (PROTONIX) 40 MG tablet Take 1 tablet (40 mg total) by mouth 2 (two) times daily.   polyvinyl alcohol (LIQUIFILM TEARS) 1.4 % ophthalmic solution Place 1 drop into both eyes as needed for dry eyes.   Potassium  Chloride ER 20 MEQ TBCR Take 20 mEq by mouth 2 (two) times daily.   PREMARIN vaginal cream Place 0.5 Applicatorfuls vaginally 2 (two) times a week. On Tuesday and Saturday     Use as directed   rosuvastatin (CRESTOR) 5 MG tablet Take 1 tablet (5 mg total) by mouth daily.   Facility-Administered Encounter Medications as of 01/26/2022  Medication   0.9 %  sodium chloride infusion    Allergies as of 01/26/2022 - Review Complete 01/26/2022  Allergen Reaction Noted   Clindamycin  Diarrhea and Nausea And Vomiting 06/21/2021   Asa [aspirin] Other (See Comments) 09/15/2021   Codeine Other (See Comments) 02/09/2014   Lisinopril Cough 02/28/2011   Simvastatin Diarrhea 02/28/2011    Past Medical History:  Diagnosis Date   Allergic rhinitis    Anemia    Anxiety disorder    Asthma    Blood transfusion without reported diagnosis    Breast cancer ([place])    bilateral   CAD (coronary artery disease)    a. NSTEMI 8/04 => LHC 07/2003:  mLAD hazy 75% => Taxus DES, EF 30% with ant-apical, apical and inf-apical AK.  b.  Echo 4/05: EF 60%.  c.  ETT/Lexiscan Myoview 06/2011: EF 80%, no ischemia, normal wall motion   Cataract    Diverticulosis 11/13/2004   GERD (gastroesophageal reflux disease)    Hyperplastic colon polyp 10/16/1999   Hypertension    IBS (irritable bowel syndrome)    Internal and external hemorrhoids without complication 16/60/6301   MI (myocardial infarction) ([place]) 12/03/2002   Anteroapical MI with PCI to the LAD   Positive PPD    PVC (premature ventricular contraction)     Past Surgical History:  Procedure Laterality Date   APPENDECTOMY     bilateral mastectomy     BIOPSY  06/21/2021   Procedure: BIOPSY;  Surgeon: Yetta Flock, MD;  Location: [facility];  Service: Gastroenterology;;   CARDIOVASCULAR STRESS TEST  07/01/2006   ef 80%   CORONARY ANGIOPLASTY WITH STENT PLACEMENT  2004   LAD   ESOPHAGOGASTRODUODENOSCOPY N/A 06/21/2021   Procedure: ESOPHAGOGASTRODUODENOSCOPY (EGD);  Surgeon: Yetta Flock, MD;  Location: [facility];  Service: Gastroenterology;  Laterality: N/A;   NASAL SINUS SURGERY Left 09/25/2021   Procedure: LEFT ENDOSCOPIC MAXILLARY ANTROSTOMY;  Surgeon: [name], MD;  Location: [facility];  Service: ENT;  Laterality: Left;   US ECHOCARDIOGRAPHY  03/27/2004   resting ef 60%    Family History  Problem Relation Age of Onset   Breast cancer Mother    Breast cancer Sister    Heart disease Maternal  Grandmother    Asthma Daughter    Spina bifida Other    Colon cancer Neg Hx    Esophageal cancer Neg Hx    Rectal cancer Neg Hx    Stomach cancer Neg Hx     Social History   Socioeconomic History   Marital status: Divorced    Spouse name: Not on file   Number of children: Not on file   Years of education: Not on file   Highest education level: Not on file  Occupational History   Occupation: retired  Tobacco Use   Smoking status: Former    Types: Cigarettes    Quit date: 08/07/1966    Years since quitting: 55.5   Smokeless tobacco: Never  Vaping Use   Vaping Use: Never used  Substance and Sexual Activity   Alcohol use: Not Currently   Drug use: No  Sexual activity: Not Currently    Birth control/protection: Post-menopausal  Other Topics Concern   Not on file  Social History Narrative   Not on file   Social Determinants of Health   Financial Resource Strain: Not on file  Food Insecurity: Not on file  Transportation Needs: Not on file  Physical Activity: Not on file  Stress: Not on file  Social Connections: Not on file  Intimate Partner Violence: Not on file      Review of systems: All other review of systems negative except as mentioned in the HPI.   Physical Exam: Vitals:   01/26/22 1058  BP: 138/82  Pulse: 67   Body mass index is 25.22 kg/m. Gen:      No acute distress HEENT:  sclera anicteric Abd:      soft, non-tender; no palpable masses, no distension Ext:    No edema Neuro: alert and oriented x 3 Psych: normal mood and affect  Data Reviewed:  Reviewed labs, radiology imaging, old records and pertinent past GI work up   Assessment and Plan/Recommendations:  76 year old very pleasant female with history of CAD, hypertension, iron deficiency anemia in the setting of acute erosive gastritis and gastric ulcers Gastric biopsies negative for dysplasia or H. pylori bacterial infection, benign hyperplastic gastric polyp. Will decrease the dose  of pantoprazole to 40 mg daily, advised patient to take 30 minutes before breakfast Follow-up hgb, hct , iron panel with ferritin for iron deficiency anemia Use oral iron once daily to prevent constipation   B12 deficiency: Continue B12 injections.  Recheck B12 level in 6 to 12 months.   Return in 6 months  This visit required 40 minutes of patient care (this includes precharting, chart review, review of results, face-to-face time used for counseling as well as treatment plan and follow-up. The patient was provided an opportunity to ask questions and all were answered. The patient agreed with the plan and demonstrated an understanding of the instructions.  Damaris Hippo , MD    CC: Cari Caraway, MD

## 2022-01-26 NOTE — Patient Instructions (Signed)
We have sent the following medications to your pharmacy for you to pick up at your convenience: Pantoprazole   Decrease your Pantoprazole to 40mg  - once daily.   Use Iron tablets once daily.   Your provider has requested that you go to the basement level for lab work before leaving today. Press "B" on the elevator. The lab is located at the first door on the left as you exit the elevator.  If you are age 78 or older, your body mass index should be between 23-30. Your Body mass index is 25.22 kg/m. If this is out of the aforementioned range listed, please consider follow up with your Primary Care Provider.   The [facility] providers would like to encourage you to use [facility] to communicate with providers for non-urgent requests or questions.  Due to long hold times on the telephone, sending your provider a message by [facility] may be a faster and more efficient way to get a response.  Please allow 48 business hours for a response.  Please remember that this is for non-urgent requests.  _______________________________________________________  Thank you for choosing me and [facility].  Dr.Nandigam

## 2022-02-12 ENCOUNTER — Encounter: Payer: Self-pay | Admitting: Gastroenterology

## 2022-03-01 DIAGNOSIS — E782 Mixed hyperlipidemia: Secondary | ICD-10-CM | POA: Diagnosis not present

## 2022-03-01 DIAGNOSIS — M810 Age-related osteoporosis without current pathological fracture: Secondary | ICD-10-CM | POA: Diagnosis not present

## 2022-03-01 DIAGNOSIS — I10 Essential (primary) hypertension: Secondary | ICD-10-CM | POA: Diagnosis not present

## 2022-03-08 DIAGNOSIS — E782 Mixed hyperlipidemia: Secondary | ICD-10-CM | POA: Diagnosis not present

## 2022-03-08 DIAGNOSIS — K2901 Acute gastritis with bleeding: Secondary | ICD-10-CM | POA: Diagnosis not present

## 2022-03-08 DIAGNOSIS — D500 Iron deficiency anemia secondary to blood loss (chronic): Secondary | ICD-10-CM | POA: Diagnosis not present

## 2022-03-08 DIAGNOSIS — I10 Essential (primary) hypertension: Secondary | ICD-10-CM | POA: Diagnosis not present

## 2022-03-08 DIAGNOSIS — L719 Rosacea, unspecified: Secondary | ICD-10-CM | POA: Diagnosis not present

## 2022-03-08 DIAGNOSIS — I2510 Atherosclerotic heart disease of native coronary artery without angina pectoris: Secondary | ICD-10-CM | POA: Diagnosis not present

## 2022-03-08 DIAGNOSIS — M810 Age-related osteoporosis without current pathological fracture: Secondary | ICD-10-CM | POA: Diagnosis not present

## 2022-03-08 DIAGNOSIS — K589 Irritable bowel syndrome without diarrhea: Secondary | ICD-10-CM | POA: Diagnosis not present

## 2022-03-08 DIAGNOSIS — J309 Allergic rhinitis, unspecified: Secondary | ICD-10-CM | POA: Diagnosis not present

## 2022-03-08 DIAGNOSIS — J4540 Moderate persistent asthma, uncomplicated: Secondary | ICD-10-CM | POA: Diagnosis not present

## 2022-03-15 DIAGNOSIS — M25562 Pain in left knee: Secondary | ICD-10-CM | POA: Diagnosis not present

## 2022-03-24 DIAGNOSIS — M25562 Pain in left knee: Secondary | ICD-10-CM | POA: Diagnosis not present

## 2022-03-29 DIAGNOSIS — M25562 Pain in left knee: Secondary | ICD-10-CM | POA: Diagnosis not present

## 2022-04-23 DIAGNOSIS — S83281A Other tear of lateral meniscus, current injury, right knee, initial encounter: Secondary | ICD-10-CM | POA: Diagnosis not present

## 2022-04-23 DIAGNOSIS — M25562 Pain in left knee: Secondary | ICD-10-CM | POA: Diagnosis not present

## 2022-04-23 DIAGNOSIS — S83241A Other tear of medial meniscus, current injury, right knee, initial encounter: Secondary | ICD-10-CM | POA: Diagnosis not present

## 2022-04-25 ENCOUNTER — Telehealth: Payer: Self-pay

## 2022-04-25 NOTE — Telephone Encounter (Signed)
   Pre-operative Risk Assessment    Patient Name: [name] [name]  DOB: 12-21-1945 MRN: [id number]      Request for Surgical Clearance    Procedure:   Left Knee arthroscopic partial medial and la   Date of Surgery:  Clearance TBD                                 Surgeon:  Dr. Jonn Shingles  Surgeon's Group or Practice Name:  [facility]  Phone number:  [phone] Fax number:  [phone]   Type of Clearance Requested:   - Pharmacy:  Hold Clopidogrel (Plavix)     Type of Anesthesia:  General  with knee block    Additional requests/questions:    Oneal Grout   04/25/2022, 9:46 AM

## 2022-04-25 NOTE — Telephone Encounter (Signed)
Her Plavix may be held for 5-7 days prior to her surgery.  Preoperative team, please contact this patient and set up a phone call appointment for further cardiac evaluation.  Thank you for your help.  Jossie Ng. Lannie Heaps NP-C    04/25/2022, 10:11 AM [place] [address] Office [phone] Fax [phone]

## 2022-05-01 ENCOUNTER — Telehealth: Payer: Self-pay | Admitting: *Deleted

## 2022-05-01 NOTE — Telephone Encounter (Signed)
Pt agreeable to tele pre op appt 05/04/22 @ 1 PM. Med rec and consent are done.   Will send FYI to requesting office the pt has tele[phone appt with pre op provider 05/04/22 @ 1 pm

## 2022-05-01 NOTE — Telephone Encounter (Signed)
Pt agreeable to tele pre op appt 05/04/22 @ 1 PM. Med rec and consent are done.     Patient Consent for Virtual Visit        [name] [name] has provided verbal consent on 05/01/2022 for a virtual visit (video or telephone).   CONSENT FOR VIRTUAL VISIT FOR:  [name] [name]  By participating in this virtual visit I agree to the following:  I hereby voluntarily request, consent and authorize [facility] and its employed or contracted physicians, Engineer, materials, nurse practitioners or other licensed health care professionals (the Practitioner), to provide me with telemedicine health care services (the "Services") as deemed necessary by the treating Practitioner. I acknowledge and consent to receive the Services by the Practitioner via telemedicine. I understand that the telemedicine visit will involve communicating with the Practitioner through live audiovisual communication technology and the disclosure of certain medical information by electronic transmission. I acknowledge that I have been given the opportunity to request an in-person assessment or other available alternative prior to the telemedicine visit and am voluntarily participating in the telemedicine visit.  I understand that I have the right to withhold or withdraw my consent to the use of telemedicine in the course of my care at any time, without affecting my right to future care or treatment, and that the Practitioner or I may terminate the telemedicine visit at any time. I understand that I have the right to inspect all information obtained and/or recorded in the course of the telemedicine visit and may receive copies of available information for a reasonable fee.  I understand that some of the potential risks of receiving the Services via telemedicine include:  Delay or interruption in medical evaluation due to technological equipment failure or disruption; Information transmitted may not be sufficient (e.g. poor resolution  of images) to allow for appropriate medical decision making by the Practitioner; and/or  In rare instances, security protocols could fail, causing a breach of personal health information.  Furthermore, I acknowledge that it is my responsibility to provide information about my medical history, conditions and care that is complete and accurate to the best of my ability. I acknowledge that Practitioner's advice, recommendations, and/or decision may be based on factors not within their control, such as incomplete or inaccurate data provided by me or distortions of diagnostic images or specimens that may result from electronic transmissions. I understand that the practice of medicine is not an exact science and that Practitioner makes no warranties or guarantees regarding treatment outcomes. I acknowledge that a copy of this consent can be made available to me via my patient portal ([facility]), or I can request a printed copy by calling the office of [facility].    I understand that my insurance will be billed for this visit.   I have read or had this consent read to me. I understand the contents of this consent, which adequately explains the benefits and risks of the Services being provided via telemedicine.  I have been provided ample opportunity to ask questions regarding this consent and the Services and have had my questions answered to my satisfaction. I give my informed consent for the services to be provided through the use of telemedicine in my medical care

## 2022-05-04 ENCOUNTER — Ambulatory Visit: Payer: Medicare Other | Admitting: Physician Assistant

## 2022-05-04 DIAGNOSIS — Z01810 Encounter for preprocedural cardiovascular examination: Secondary | ICD-10-CM

## 2022-05-04 NOTE — Progress Notes (Signed)
Virtual Visit via Telephone Note   Because of [name] [name]'s co-morbid illnesses, she is at least at moderate risk for complications without adequate follow up.  This format is felt to be most appropriate for this patient at this time.  The patient did not have access to video technology/had technical difficulties with video requiring transitioning to audio format only (telephone).  All issues noted in this document were discussed and addressed.  No physical exam could be performed with this format.  Please refer to the patient's chart for her consent to telehealth for [facility]  Evaluation Performed:  Preoperative cardiovascular risk assessment _____________   Date:  05/04/2022   Patient ID:  [name] [name], DOB October 08, 1946, MRN [id number] Patient Location:  Home Provider location:   Office  Primary Care Provider:  Cari Caraway, MD Primary Cardiologist:  Mertie Moores, MD  Chief Complaint / Patient Profile   76 y.o. y/o female with a h/o CAD, hyperlipidemia, asthma and a history of breast cancer who is pending knee arthroscopy and presents today for telephonic preoperative cardiovascular risk assessment.  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 NSTEMI 8/04 => LHC 07/2003:  mLAD hazy 75% => Taxus DES, EF 30% with ant-apical, apical and inf-apical AK.  b.  Echo 4/05: EF 60%.  c.  ETT/Lexiscan Myoview 06/2011: EF 80%, no ischemia, normal wall motion   Cataract    Diverticulosis 11/13/2004   GERD (gastroesophageal reflux disease)    Hyperplastic colon polyp 10/16/1999   Hypertension    IBS (irritable bowel syndrome)    Internal and external hemorrhoids without complication 78/29/5621   MI (myocardial infarction) ([place]) 12/03/2002   Anteroapical MI with PCI to the  LAD   Positive PPD    PVC (premature ventricular contraction)    Past Surgical History:  Procedure Laterality Date   APPENDECTOMY     bilateral mastectomy     BIOPSY  06/21/2021   Procedure: BIOPSY;  Surgeon: Yetta Flock, MD;  Location: [facility];  Service: Gastroenterology;;   CARDIOVASCULAR STRESS TEST  07/01/2006   ef 80%   CORONARY ANGIOPLASTY WITH STENT PLACEMENT  2004   LAD   ESOPHAGOGASTRODUODENOSCOPY N/A 06/21/2021   Procedure: ESOPHAGOGASTRODUODENOSCOPY (EGD);  Surgeon: Yetta Flock, MD;  Location: [facility];  Service: Gastroenterology;  Laterality: N/A;   NASAL SINUS SURGERY Left 09/25/2021   Procedure: LEFT ENDOSCOPIC MAXILLARY ANTROSTOMY;  Surgeon: [name], MD;  Location: [facility];  Service: ENT;  Laterality: Left;   US ECHOCARDIOGRAPHY  03/27/2004   resting ef 60%    Allergies  Allergies  Allergen Reactions   Clindamycin Diarrhea and Nausea And Vomiting    Blood in diarrhea   Asa [Aspirin] Other (See Comments)    GI bleed   Codeine Other (See Comments)    headache   Lisinopril Cough   Simvastatin Diarrhea    History of Present Illness    [name] [name] is a 76 y.o. female who presents via Engineer, civil (consulting) for a telehealth visit today.  Pt was last seen in cardiology clinic on 09/05/2021 by Dr Acie Fredrickson.  At that time JASELYN NAHM was doing well.  The patient is now pending procedure as  outlined above. Since her last visit, she has been doing well without exertional chest pain or worsening dyspnea.   Home Medications    Prior to Admission medications   Medication Sig Start Date End Date Taking? Authorizing Provider  acetaminophen (TYLENOL) 325 MG tablet Take 650 mg by mouth every 6 (six) hours as needed for mild pain.     [provider]  albuterol (VENTOLIN HFA) 108 (90 Base) MCG/ACT inhaler Inhale 2 puffs into the lungs every 4 (four) hours as needed for shortness of breath.    [provider]  clopidogrel (PLAVIX) 75 MG tablet Take 1 tablet (75 mg total) by mouth daily. 09/05/21   Nahser, Wonda Cheng, MD  Cyanocobalamin (B-12 COMPLIANCE INJECTION IJ) Inject 1 mL as directed every 30 (thirty) days.    [provider]  denosumab (PROLIA) 60 MG/ML SOSY injection Inject 60 mg into the skin every 6 (six) months.    [provider]  escitalopram (LEXAPRO) 10 MG tablet Take 10 mg by mouth daily.    [provider]  Ferrous Sulfate (IRON SUPPLEMENT PO) Take 18 mg by mouth in the morning and at bedtime. Patient not taking: Reported on 05/01/2022    [provider]  FLOVENT HFA 220 MCG/ACT inhaler Inhale 1 puff into the lungs 2 (two) times daily. 01/20/21   [provider]  fluticasone (FLONASE) 50 MCG/ACT nasal spray Place 1 spray into both nostrils 2 (two) times daily. 01/08/14   [provider]  hydrochlorothiazide (HYDRODIURIL) 25 MG tablet Take 1 tablet (25 mg total) by mouth daily. 02/13/16   Nahser, Wonda Cheng, MD  irbesartan (AVAPRO) 150 MG tablet Take 150 mg by mouth daily. 08/07/17   [provider]  ketotifen (ZADITOR) 0.025 % ophthalmic solution Place 1 drop into both eyes 2 (two) times daily as needed (allergies).    [provider]  nebivolol (BYSTOLIC) 2.5 MG tablet Take 1 tablet (2.5 mg total) by mouth daily. 09/05/20   Nahser, Wonda Cheng, MD  pantoprazole (PROTONIX) 40 MG tablet Take 1 tablet (40 mg total) by mouth daily. 01/26/22   Mauri Pole, MD  polyvinyl alcohol (LIQUIFILM TEARS) 1.4 % ophthalmic solution Place 1 drop into both eyes as needed for dry eyes.    [provider]  Potassium Chloride ER 20 MEQ TBCR Take 20 mEq by mouth 2 (two) times daily. 01/13/15   Nahser, Wonda Cheng, MD  PREMARIN vaginal cream Place 0.5 Applicatorfuls vaginally 2 (two) times a week. On Tuesday and Saturday     Use as directed 05/03/11   [provider]  rosuvastatin (CRESTOR) 5 MG tablet Take 1 tablet (5  mg total) by mouth daily. 01/13/13   Nahser, Wonda Cheng, MD    Physical Exam    Vital Signs:  Quentin Ore does not have vital signs available for review today.  Given telephonic nature of communication, physical exam is limited. AAOx3. NAD. Normal affect.  Speech and respirations are unlabored.  Accessory Clinical Findings    None  Assessment & Plan    1.  Preoperative Cardiovascular Risk Assessment:  -Patient has upcoming knee arthroscopy procedure.  She denies any recent chest pain or worsening shortness of breath.  Last PCI was in 2004.  She is able to do every day activity without any issue.  If needed, she can accomplish more than 4 METS of activity however her functional ability is somewhat limited by knee pain.  She may hold Plavix for 5 to  7 days prior to the procedure and restart as once possible afterward at the surgeon's discretion.  A copy of this note will be routed to requesting surgeon.  Time:   Today, I have spent 4 minutes with the patient with telehealth technology discussing medical history, symptoms, and management plan.     [place], [place]  05/04/2022, 12:44 PM

## 2022-07-03 HISTORY — PX: KNEE ARTHROPLASTY: SHX992

## 2022-07-05 DIAGNOSIS — M94262 Chondromalacia, left knee: Secondary | ICD-10-CM | POA: Diagnosis not present

## 2022-07-05 DIAGNOSIS — S83272A Complex tear of lateral meniscus, current injury, left knee, initial encounter: Secondary | ICD-10-CM | POA: Diagnosis not present

## 2022-07-05 DIAGNOSIS — M2342 Loose body in knee, left knee: Secondary | ICD-10-CM | POA: Diagnosis not present

## 2022-07-05 DIAGNOSIS — M948X6 Other specified disorders of cartilage, lower leg: Secondary | ICD-10-CM | POA: Diagnosis not present

## 2022-07-05 DIAGNOSIS — G8918 Other acute postprocedural pain: Secondary | ICD-10-CM | POA: Diagnosis not present

## 2022-07-05 DIAGNOSIS — S83232A Complex tear of medial meniscus, current injury, left knee, initial encounter: Secondary | ICD-10-CM | POA: Diagnosis not present

## 2022-07-12 DIAGNOSIS — M25562 Pain in left knee: Secondary | ICD-10-CM | POA: Diagnosis not present

## 2022-07-18 DIAGNOSIS — M25562 Pain in left knee: Secondary | ICD-10-CM | POA: Diagnosis not present

## 2022-07-20 DIAGNOSIS — M25562 Pain in left knee: Secondary | ICD-10-CM | POA: Diagnosis not present

## 2022-07-23 DIAGNOSIS — M25562 Pain in left knee: Secondary | ICD-10-CM | POA: Diagnosis not present

## 2022-07-24 DIAGNOSIS — M810 Age-related osteoporosis without current pathological fracture: Secondary | ICD-10-CM | POA: Diagnosis not present

## 2022-07-26 DIAGNOSIS — M25562 Pain in left knee: Secondary | ICD-10-CM | POA: Diagnosis not present

## 2022-07-30 DIAGNOSIS — M25562 Pain in left knee: Secondary | ICD-10-CM | POA: Diagnosis not present

## 2022-08-02 DIAGNOSIS — M25562 Pain in left knee: Secondary | ICD-10-CM | POA: Diagnosis not present

## 2022-08-10 DIAGNOSIS — R300 Dysuria: Secondary | ICD-10-CM | POA: Diagnosis not present

## 2022-08-17 IMAGING — CT CT ABD-PELV W/ CM
2 of 5 series · 15 of 46 positions shown, 17 images · IV contrast (omnipaque)
Comparison: 05/17/2014

CLINICAL DATA: GI bleed. Epigastric pain. Concern for C diff
colitis given recent antibiotics versus less likely a stomach ulcer.

EXAM:
CT ABDOMEN AND PELVIS WITH CONTRAST
TECHNIQUE: Multidetector CT imaging of the abdomen and pelvis was performed
using the standard protocol following bolus administration of
intravenous contrast.
CONTRAST:  80mL OMNIPAQUE IOHEXOL 350 MG/ML SOLN

[Series 2: axial st · axial · 0.73mm/px · z∈[-766,-436]mm · 12 of 78 slices shown, 14 images]
[im 6/78  soft-tissue]
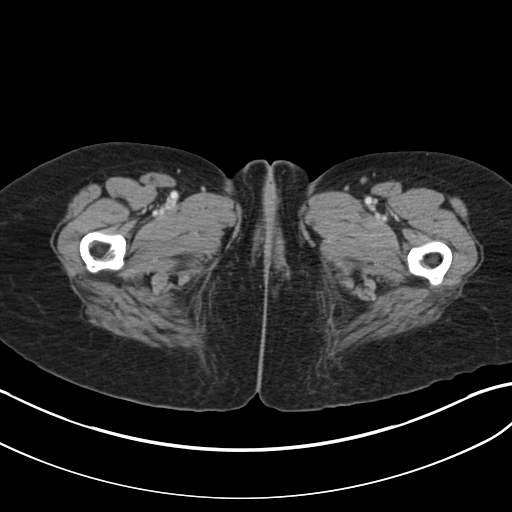
[im 6/78  bone]
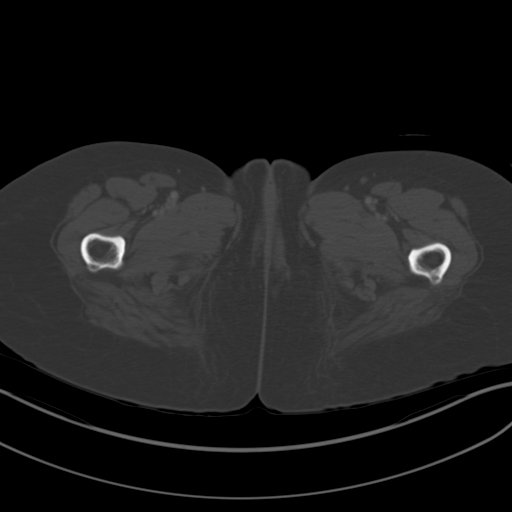
[im 12/78  soft-tissue]
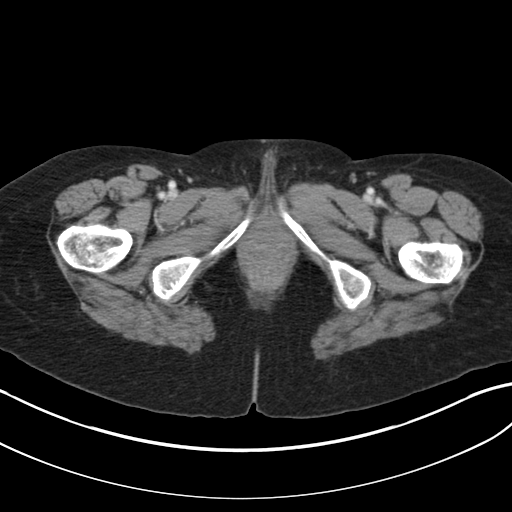
[im 18/78  soft-tissue]
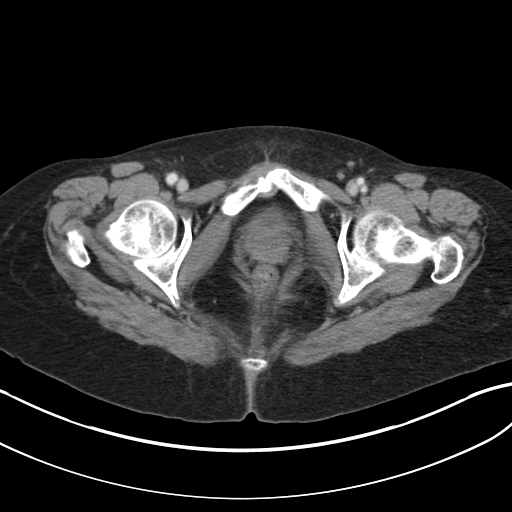
[im 24/78  soft-tissue]
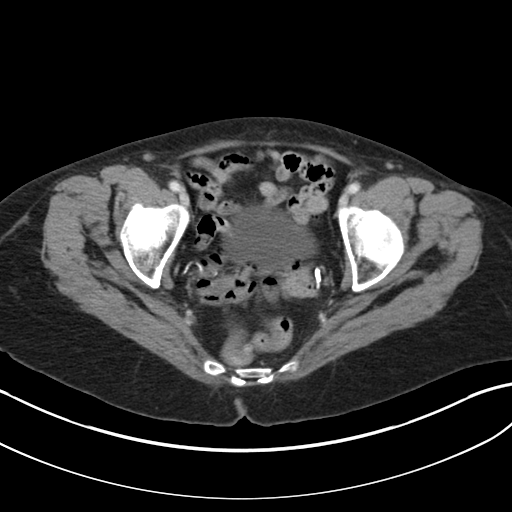
[im 30/78  soft-tissue]
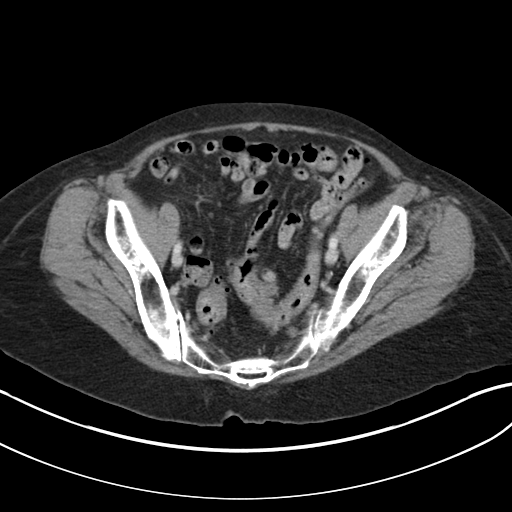
[im 36/78  soft-tissue]
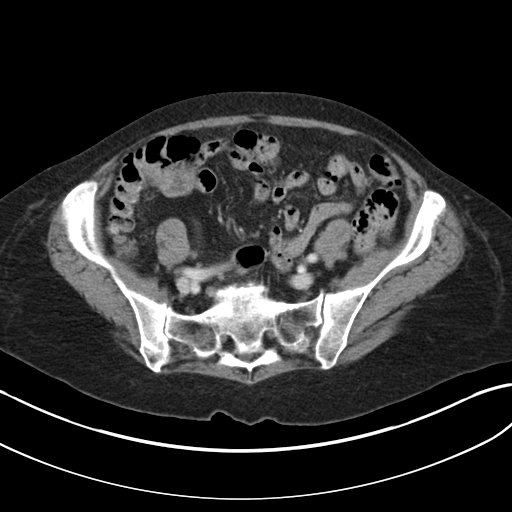
[im 42/78  soft-tissue]
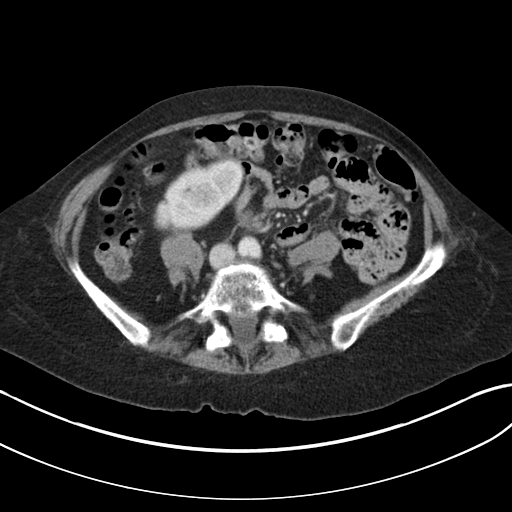
[im 48/78  soft-tissue]
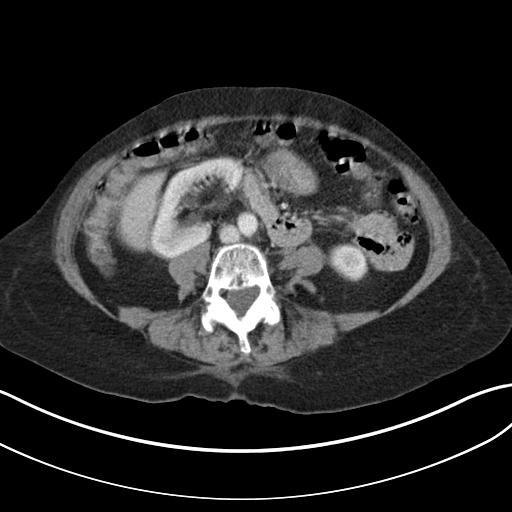
[im 54/78  soft-tissue]
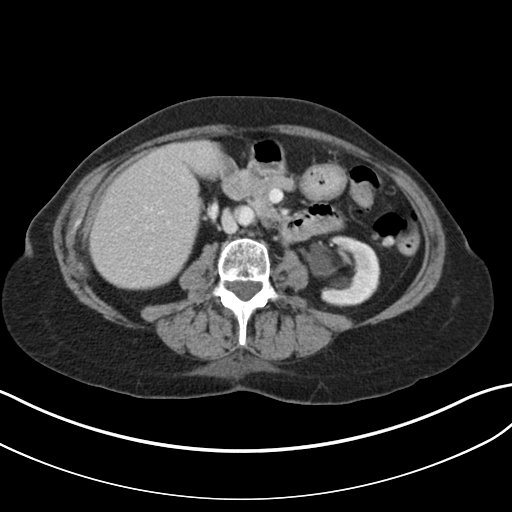
[im 54/78  bone]
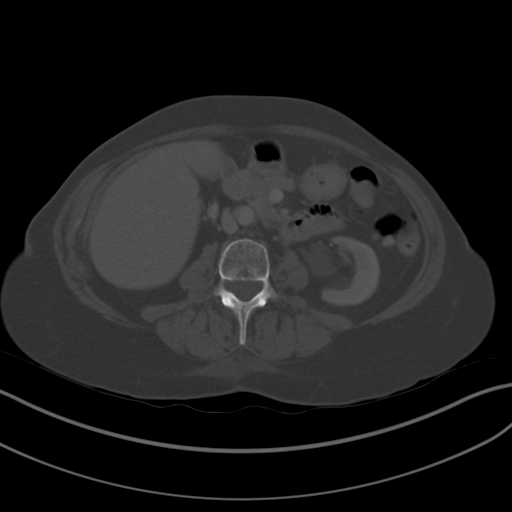
[im 60/78  soft-tissue]
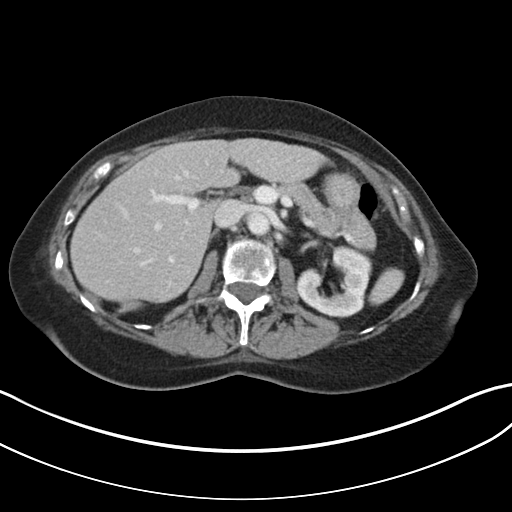
[im 66/78  soft-tissue]
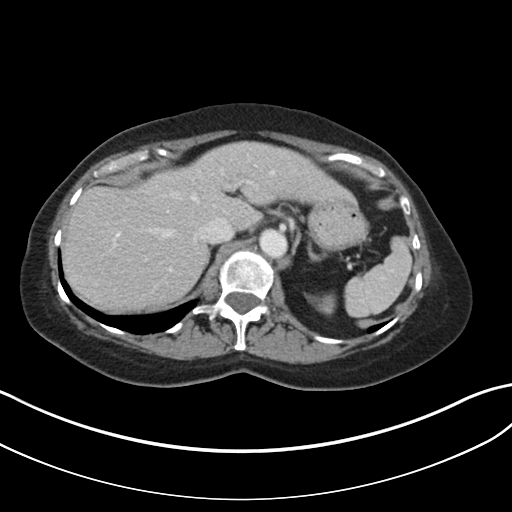
[im 72/78  soft-tissue]
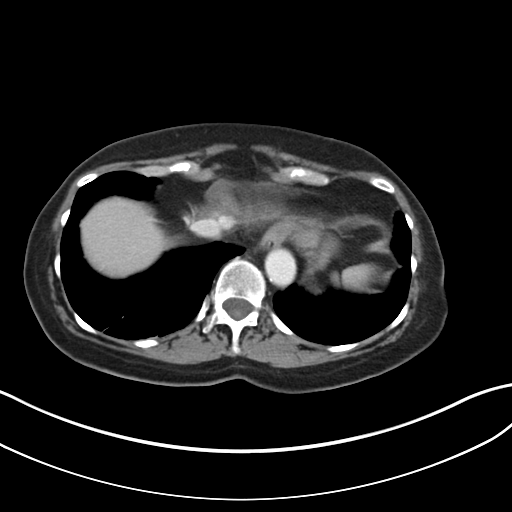

[Series 4: coronal st · coronal · 0.74mm/px · 3 of 121 slices shown]
[im 41/121  soft-tissue]
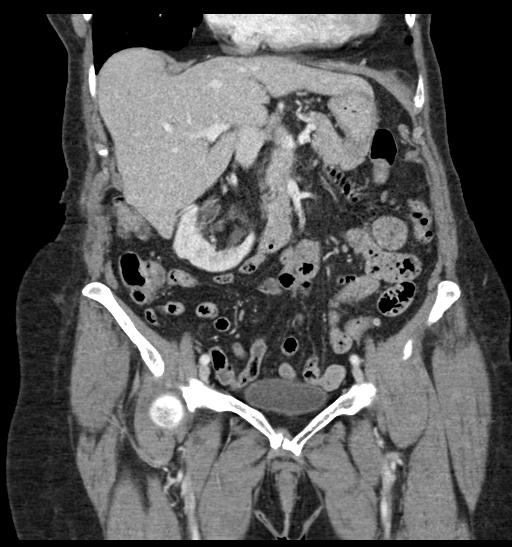
[im 54/121  soft-tissue]
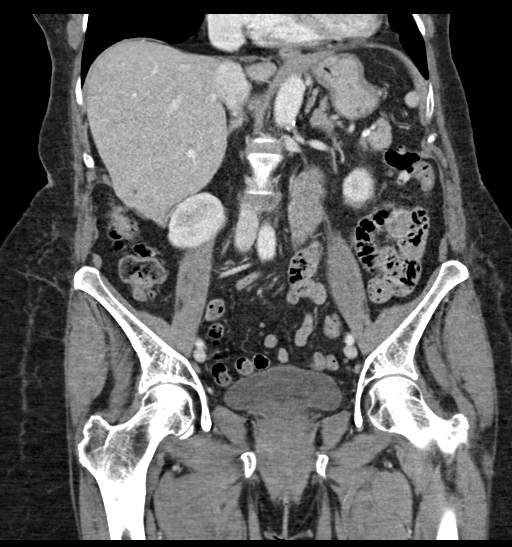
[im 67/121  soft-tissue]
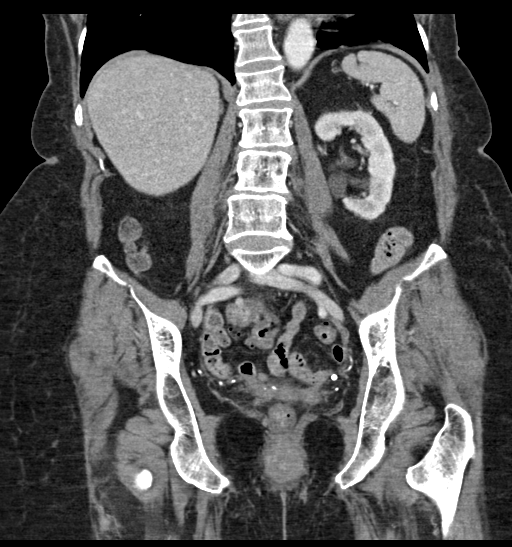

[15 of 46 positions shown; findings below may reference images not displayed]

FINDINGS: Lower chest: Linear atelectasis or scarring in the right lower lobe.
Or small fat containing Bochdalek hernia on the left. No confluent
airspace disease or pleural effusion.

Hepatobiliary: Scattered low-density lesions throughout the liver
are all subcentimeter, similar to prior exam and likely cysts. No
suspicious liver lesion. Gallbladder physiologically distended, no
calcified stone. No biliary dilatation.

Pancreas: No ductal dilatation or inflammation.

Spleen: Normal in size without focal abnormality.

Adrenals/Urinary Tract: No adrenal nodule. No hydronephrosis. There
are bilateral parapelvic cysts. 6 mm nonobstructing stone in the
lower left kidney there is homogeneous renal enhancement with
symmetric excretion on delayed phase imaging. Partially distended
and unremarkable urinary bladder.

Stomach/Bowel: Decompressed stomach. Fall there is no small bowel
obstruction or inflammation. No small bowel wall thickening.
Occasional fecalization of distal small bowel contents. Appendix not
visualized, reported history of appendectomy. There is multifocal
colonic diverticulosis. Equivocal minor wall thickening of the
sigmoid colon, for example series 2, image 50, but no focal
diverticulitis or pericolonic inflammation. No liquid stool in the
colon. Small volume of formed stool.

Vascular/Lymphatic: Aortic atherosclerosis. No aortic aneurysm.
Patent portal and mesenteric veins. No portal venous or mesenteric
gas. No abdominopelvic adenopathy.

Reproductive: Uterus is not visualized, presumably surgically
absent. No adnexal mass

Other: No ascites. No free air. No inflammatory change in the
abdomen or pelvis. Abdominal wall hernia.

Musculoskeletal: There are no acute or suspicious osseous
abnormalities. Chronic appearing anterior wedging of L1 vertebral
body. L5-S1 degenerative disc disease.
IMPRESSION: 1. Equivocal minor wall thickening of the sigmoid colon in the
region of multiple colonic diverticula. This may represent minimal
colitis, but no focally inflamed diverticulum to suggest
diverticulitis. No CT findings to suggest C diff colitis.
2. No other acute abnormality in the abdomen/pelvis.
3. Nonobstructing left renal stone. Bilateral parapelvic cysts.

Aortic Atherosclerosis (NL6TP-OE7.7).

## 2022-09-10 DIAGNOSIS — E782 Mixed hyperlipidemia: Secondary | ICD-10-CM | POA: Diagnosis not present

## 2022-09-12 DIAGNOSIS — D692 Other nonthrombocytopenic purpura: Secondary | ICD-10-CM | POA: Diagnosis not present

## 2022-09-12 DIAGNOSIS — L821 Other seborrheic keratosis: Secondary | ICD-10-CM | POA: Diagnosis not present

## 2022-09-12 DIAGNOSIS — Z85820 Personal history of malignant melanoma of skin: Secondary | ICD-10-CM | POA: Diagnosis not present

## 2022-09-12 DIAGNOSIS — D220 Melanocytic nevi of lip: Secondary | ICD-10-CM | POA: Diagnosis not present

## 2022-09-12 DIAGNOSIS — D1801 Hemangioma of skin and subcutaneous tissue: Secondary | ICD-10-CM | POA: Diagnosis not present

## 2022-09-12 DIAGNOSIS — L814 Other melanin hyperpigmentation: Secondary | ICD-10-CM | POA: Diagnosis not present

## 2022-09-12 DIAGNOSIS — D225 Melanocytic nevi of trunk: Secondary | ICD-10-CM | POA: Diagnosis not present

## 2022-09-13 DIAGNOSIS — J4540 Moderate persistent asthma, uncomplicated: Secondary | ICD-10-CM | POA: Diagnosis not present

## 2022-09-13 DIAGNOSIS — M810 Age-related osteoporosis without current pathological fracture: Secondary | ICD-10-CM | POA: Diagnosis not present

## 2022-09-13 DIAGNOSIS — I10 Essential (primary) hypertension: Secondary | ICD-10-CM | POA: Diagnosis not present

## 2022-09-13 DIAGNOSIS — E782 Mixed hyperlipidemia: Secondary | ICD-10-CM | POA: Diagnosis not present

## 2022-09-13 DIAGNOSIS — K219 Gastro-esophageal reflux disease without esophagitis: Secondary | ICD-10-CM | POA: Diagnosis not present

## 2022-09-13 DIAGNOSIS — I2510 Atherosclerotic heart disease of native coronary artery without angina pectoris: Secondary | ICD-10-CM | POA: Diagnosis not present

## 2022-09-13 DIAGNOSIS — Z23 Encounter for immunization: Secondary | ICD-10-CM | POA: Diagnosis not present

## 2022-09-13 DIAGNOSIS — Z79899 Other long term (current) drug therapy: Secondary | ICD-10-CM | POA: Diagnosis not present

## 2022-09-14 ENCOUNTER — Other Ambulatory Visit: Payer: Self-pay | Admitting: Family Medicine

## 2022-09-14 DIAGNOSIS — M810 Age-related osteoporosis without current pathological fracture: Secondary | ICD-10-CM

## 2022-09-18 ENCOUNTER — Encounter: Payer: Self-pay | Admitting: Cardiovascular Disease

## 2022-09-18 ENCOUNTER — Encounter: Payer: Medicare Other | Admitting: Cardiovascular Disease

## 2022-09-18 ENCOUNTER — Other Ambulatory Visit: Payer: Self-pay

## 2022-09-18 ENCOUNTER — Emergency Department (HOSPITAL_COMMUNITY): Payer: Medicare Other

## 2022-09-18 ENCOUNTER — Emergency Department (HOSPITAL_COMMUNITY)
Admission: EM | Admit: 2022-09-18 | Discharge: 2022-09-18 | Disposition: A | Discharge status: Home / Self Care | Payer: Medicare Other | Attending: Emergency Medicine | Admitting: Emergency Medicine

## 2022-09-18 DIAGNOSIS — Z79899 Other long term (current) drug therapy: Secondary | ICD-10-CM | POA: Insufficient documentation

## 2022-09-18 DIAGNOSIS — Z7951 Long term (current) use of inhaled steroids: Secondary | ICD-10-CM | POA: Insufficient documentation

## 2022-09-18 DIAGNOSIS — R002 Palpitations: Secondary | ICD-10-CM | POA: Insufficient documentation

## 2022-09-18 DIAGNOSIS — R079 Chest pain, unspecified: Secondary | ICD-10-CM | POA: Diagnosis not present

## 2022-09-18 DIAGNOSIS — I2510 Atherosclerotic heart disease of native coronary artery without angina pectoris: Secondary | ICD-10-CM | POA: Insufficient documentation

## 2022-09-18 DIAGNOSIS — I10 Essential (primary) hypertension: Secondary | ICD-10-CM | POA: Insufficient documentation

## 2022-09-18 DIAGNOSIS — J45909 Unspecified asthma, uncomplicated: Secondary | ICD-10-CM | POA: Diagnosis not present

## 2022-09-18 DIAGNOSIS — Z7902 Long term (current) use of antithrombotics/antiplatelets: Secondary | ICD-10-CM | POA: Insufficient documentation

## 2022-09-18 LAB — COMPREHENSIVE METABOLIC PANEL
ALT: 14 U/L (ref 0–44)
AST: 20 U/L (ref 15–41)
Albumin: 3.9 g/dL (ref 3.5–5.0)
Alkaline Phosphatase: 67 U/L (ref 38–126)
Anion gap: 10 (ref 5–15)
BUN: 12 mg/dL (ref 8–23)
CO2: 25 mmol/L (ref 22–32)
Calcium: 9 mg/dL (ref 8.9–10.3)
Chloride: 106 mmol/L (ref 98–111)
Creatinine, Ser: 0.87 mg/dL (ref 0.44–1.00)
GFR, Estimated: >60 mL/min (ref >60)
Glucose, Bld: 97 mg/dL (ref 70–99)
Potassium: 3.4 mmol/L — ABNORMAL LOW (ref 3.5–5.1)
Sodium: 141 mmol/L (ref 135–145)
Total Bilirubin: 1.1 mg/dL (ref 0.3–1.2)
Total Protein: 6.9 g/dL (ref 6.5–8.1)

## 2022-09-18 LAB — CBC
HCT: 46.2 % — ABNORMAL HIGH (ref 36.0–46.0)
Hemoglobin: 14.7 g/dL (ref 12.0–15.0)
MCH: 30.4 pg (ref 26.0–34.0)
MCHC: 31.8 g/dL (ref 30.0–36.0)
MCV: 95.7 fL (ref 80.0–100.0)
Platelets: 321 10*3/uL (ref 150–400)
RBC: 4.83 MIL/uL (ref 3.87–5.11)
RDW: 13.6 % (ref 11.5–15.5)
WBC: 8.6 10*3/uL (ref 4.0–10.5)
nRBC: 0 % (ref 0.0–0.2)

## 2022-09-18 LAB — TROPONIN I (HIGH SENSITIVITY)
Troponin I (High Sensitivity): 9 ng/L (ref <18)
Troponin I (High Sensitivity): 11 ng/L

## 2022-09-18 MED ORDER — POTASSIUM CHLORIDE CRYS ER 20 MEQ PO TBCR: 40.0000 meq | EXTENDED_RELEASE_TABLET | Freq: Once | ORAL | Status: DC

## 2022-09-18 NOTE — ED Triage Notes (Signed)
Pt. Stated, I started this morning having palpitations and some chest tightness , now its calmed down. This something new.

## 2022-09-18 NOTE — ED Notes (Signed)
DC instructions reviewed with pt. Pt verbalized understanding.  PT DC.  

## 2022-09-18 NOTE — Discharge Instructions (Signed)
You were seen in the emergency department for your palpitations.  Your EKG showed a single PVC and otherwise was normal and your labs showed mildly low potassium level of 3.4.  It is possible that you may have had more frequent PVCs with your lower potassium and we did replete this for you in the emergency department.  You should follow-up with your cardiologist today as planned to have your symptoms rechecked and see if you need any further work-up.  You should return to the emergency department if you are having worsening chest pain, you pass out, you have severe shortness of breath or if you have any other new or concerning symptoms.

## 2022-09-18 NOTE — Progress Notes (Signed)
This encounter was created in error - please disregard.

## 2022-09-18 NOTE — ED Provider Triage Note (Signed)
Emergency Medicine Provider Triage Evaluation Note  [name] [name] , a 76 y.o. female  was evaluated in triage.  Pt complains of shortness of breath and palpitations.  Shortness of breath started yesterday afternoon while doing yard work.  She developed a cough overnight.  Patient does endorse a history of asthma.  Took albuterol and symptoms improved.  This morning she started to feel a "heart flutter sensation".  Also endorses a history of PVCs.  Takes a low-dose beta-blocker for her PVCs.  At this time she is not shortness of breath and not experiencing palpitations.  Denies chest pain.  Review of Systems  Positive: See above Negative: See above  Physical Exam  BP (!) 172/93   Pulse 74   Temp 98.1 F (36.7 C) (Oral)   Resp 18   Ht '5\' 1"'$  (1.549 m)   Wt 59.4 kg   SpO2 96%   BMI 24.75 kg/m  Gen:   Awake, no distress   Resp:  Normal effort  MSK:   Moves extremities without difficulty  Other:    Medical Decision Making  Medically screening exam initiated at 10:49 AM.  Appropriate orders placed.  Tona Qualley Kainz was informed that the remainder of the evaluation will be completed by another provider, this initial triage assessment does not replace that evaluation, and the importance of remaining in the ED until their evaluation is complete.  Ekg, labs + troponin, cxr   Harriet Pho, PA-C 09/18/22 1053

## 2022-09-18 NOTE — ED Provider Notes (Signed)
[facility] EMERGENCY DEPARTMENT Provider Note   CSN: [id number] Arrival date & time: 09/18/22  0857     History  Chief Complaint  Patient presents with   Palpitations   Chest Pain    [name] [name] is a 76 y.o. female.  Patient is a 76 year old female with a past medical history of CAD status post stent placement, hypertension, asthma, palpitations on metoprolol presenting to the emergency department with palpitations.  The patient states that she woke up around 8:00 this morning and felt like her heart was flip flopping.  She states that shortly after her heart felt like it was racing and she had shortness of breath.  She states that she tried her albuterol pumps and took her metoprolol.  She states that the palpitations improved but she is now feeling a chest tightness.  States that she has been overcoming a recent viral illness and is still needing her albuterol inhaler frequently.  She states that she did not have any diaphoresis, nausea or vomiting with this episode.  She denies any lower extremity swelling, recent hospitalizations or surgeries, recent long travels in the car or plane, blood thinner use, hormone use or cancer history.  Of note she states that she does have a regularly scheduled appointment with her cardiologist at 1:40 PM today.  The history is provided by the patient.  Palpitations Associated symptoms: chest pain   Chest Pain Associated symptoms: palpitations        Home Medications Prior to Admission medications   Medication Sig Start Date End Date Taking? Authorizing Provider  acetaminophen (TYLENOL) 325 MG tablet Take 650 mg by mouth every 6 (six) hours as needed for mild pain.     [provider]  albuterol (VENTOLIN HFA) 108 (90 Base) MCG/ACT inhaler Inhale 2 puffs into the lungs every 4 (four) hours as needed for shortness of breath.    [provider]  clopidogrel (PLAVIX) 75 MG tablet Take 1 tablet (75 mg total)  by mouth daily. 09/05/21   Nahser, Wonda Cheng, MD  Cyanocobalamin (B-12 COMPLIANCE INJECTION IJ) Inject 1 mL as directed every 30 (thirty) days.    [provider]  denosumab (PROLIA) 60 MG/ML SOSY injection Inject 60 mg into the skin every 6 (six) months.    [provider]  escitalopram (LEXAPRO) 10 MG tablet Take 10 mg by mouth daily.    [provider]  FLOVENT HFA 220 MCG/ACT inhaler Inhale 1 puff into the lungs 2 (two) times daily. 01/20/21   [provider]  fluticasone (FLONASE) 50 MCG/ACT nasal spray Place 1 spray into both nostrils 2 (two) times daily. 01/08/14   [provider]  hydrochlorothiazide (HYDRODIURIL) 25 MG tablet Take 1 tablet (25 mg total) by mouth daily. 02/13/16   Nahser, Wonda Cheng, MD  irbesartan (AVAPRO) 150 MG tablet Take 150 mg by mouth daily. 08/07/17   [provider]  ketotifen (ZADITOR) 0.025 % ophthalmic solution Place 1 drop into both eyes 2 (two) times daily as needed (allergies).    [provider]  nebivolol (BYSTOLIC) 2.5 MG tablet Take 1 tablet (2.5 mg total) by mouth daily. 09/05/20   Nahser, Wonda Cheng, MD  pantoprazole (PROTONIX) 40 MG tablet Take 1 tablet (40 mg total) by mouth daily. 01/26/22   Mauri Pole, MD  polyvinyl alcohol (LIQUIFILM TEARS) 1.4 % ophthalmic solution Place 1 drop into both eyes as needed for dry eyes.    [provider]  potassium chloride SA (  KLOR-CON M) 20 MEQ tablet Take 20 mEq by mouth 2 (two) times daily. 07/29/22   [provider]  PREMARIN vaginal cream Place 0.5 Applicatorfuls vaginally 2 (two) times a week. On Tuesday and Saturday     Use as directed 05/03/11   [provider]  rosuvastatin (CRESTOR) 5 MG tablet Take 1 tablet (5 mg total) by mouth daily. 01/13/13   Nahser, Wonda Cheng, MD      Allergies    Clindamycin, Asa [aspirin], Codeine, Lisinopril, and Simvastatin    Review of Systems   Review of Systems  Cardiovascular:  Positive for  chest pain and palpitations.    Physical Exam Updated Vital Signs BP (!) 173/78 (BP Location: Right Arm)   Pulse 71   Temp 98.4 F (36.9 C) (Oral)   Resp (!) 21   Ht '5\' 1"'$  (1.549 m)   Wt 59.4 kg   SpO2 98%   BMI 24.75 kg/m  Physical Exam Vitals and nursing note reviewed.  Constitutional:      General: She is not in acute distress.    Appearance: She is well-developed.  HENT:     Head: Normocephalic and atraumatic.  Eyes:     Extraocular Movements: Extraocular movements intact.  Cardiovascular:     Rate and Rhythm: Normal rate and regular rhythm.     Pulses:          Radial pulses are 2+ on the right side and 2+ on the left side.       Dorsalis pedis pulses are 2+ on the right side and 2+ on the left side.     Heart sounds: Normal heart sounds.  Pulmonary:     Effort: Pulmonary effort is normal.     Breath sounds: Normal breath sounds.  Abdominal:     Palpations: Abdomen is soft.     Tenderness: There is no abdominal tenderness.  Musculoskeletal:     Cervical back: Normal range of motion and neck supple.     Right lower leg: No edema.     Left lower leg: No edema.  Skin:    General: Skin is warm and dry.  Neurological:     General: No focal deficit present.     Mental Status: She is alert and oriented to person, place, and time.  Psychiatric:        Mood and Affect: Mood normal.        Behavior: Behavior normal.     ED Results / Procedures / Treatments   Labs (all labs ordered are listed, but only abnormal results are displayed) Labs Reviewed  CBC - Abnormal; Notable for the following components:      Result Value   HCT 46.2 (*)    All other components within normal limits  COMPREHENSIVE METABOLIC PANEL - Abnormal; Notable for the following components:   Potassium 3.4 (*)    All other components within normal limits  TROPONIN I (HIGH SENSITIVITY)  TROPONIN I (HIGH SENSITIVITY)    EKG EKG Interpretation  Date/Time:  Tuesday September 18 2022 10:39:57  EDT Ventricular Rate:  72 PR Interval:  198 QRS Duration: 88 QT Interval:  440 QTC Calculation: 481 R Axis:   11 Text Interpretation: Sinus rhythm with sinus arrhythmia with occasional Premature ventricular complexes Nonspecific ST and T wave abnormality Prolonged QT Abnormal ECG  No longer in a fib compared to prior EKG Confirmed by Leanord Asal ([id number]) on 09/18/2022 11:24:40 AM  Radiology DG Chest 2 View  Result Date: 09/18/2022  CLINICAL DATA:  Chest pain EXAM: CHEST - 2 VIEW COMPARISON:  06/28/2015 FINDINGS: Heart size is upper limits of normal. Focus of increased density projecting over the right lung base corresponding to the anterior right sixth rib is unchanged from 2016 in compatible with summation of overlapping structures. Lungs are clear. No pleural effusion or pneumothorax. IMPRESSION: No active cardiopulmonary disease. Electronically Signed   By: Davina Poke D.O.   On: 09/18/2022 10:46    Procedures Procedures    Medications Ordered in ED Medications  potassium chloride SA (KLOR-CON M) CR tablet 40 mEq (has no administration in time range)    ED Course/ Medical Decision Making/ A&P                           Medical Decision Making This patient presents to the ED with chief complaint(s) of palpitations with pertinent past medical history of CAD s/p stent, asthma, HTN, PVCs which further complicates the presenting complaint. The complaint involves an extensive differential diagnosis and also carries with it a high risk of complications and morbidity.    The differential diagnosis includes ACS, arrhythmia, electrolyte abnormality, asthma exacerbation less likely as no wheezing on exam, anemia, hypo or hyperthyroidism less likely as vitals within normal range   Additional history obtained: Additional history obtained from family Records reviewed previous cardiology records  ED Course and Reassessment: Upon patient's arrival to the emergency department she is in  normal sinus rhythm with occasional PVCs on EKG.  She will have labs performed to further evaluate for electrolyte abnormality, ACS with associated chest tightness, anemia as possible causes of her palpitations.  She will be monitored on the cardiac monitor and will be reassessed.  Independent labs interpretation:  The following labs were independently interpreted: Mild hypokalemia otherwise within normal range  Independent visualization of imaging: - I independently visualized the following imaging with scope of interpretation limited to determining acute life threatening conditions related to emergency care: Chest x-ray, which revealed no acute disease  Consultation: - Consulted or discussed management/test interpretation w/ external professional: N/A  Consideration for admission or further workup: She has no emergent conditions requiring admission at this time.  She has regularly scheduled follow-up with cardiology today and was instructed to go to her follow-up appointment today for reassessment Social Determinants of health: N/A    Amount and/or Complexity of Data Reviewed Labs: ordered. Radiology: ordered.  Risk Prescription drug management.          Final Clinical Impression(s) / ED Diagnoses Final diagnoses:  Palpitations    Rx / DC Orders ED Discharge Orders     None         Kemper Durie, DO 09/18/22 1310

## 2022-09-20 ENCOUNTER — Other Ambulatory Visit: Payer: Medicare Other

## 2022-09-20 DIAGNOSIS — R002 Palpitations: Secondary | ICD-10-CM | POA: Insufficient documentation

## 2022-09-20 NOTE — Progress Notes (Signed)
Cardiology Office Note:    Date:  09/21/2022   ID:  Quentin Ore, DOB November 13, 1946, MRN [id number]  PCP:  Cari Caraway, [facility] Cardiologist:  Mertie Moores, MD    Referring MD: Cari Caraway, MD   Chief Complaint:  F/u for CAD and Hospitalization Follow-up (ED visit with palpitations )    Patient Profile: Coronary artery disease NSTEMI in 07/2003 s/p Taxus DES to LAD Myoview in 2012: low risk  Ischemic CM Echo 07/2003: EF 30, apical and inf-apical AK Echo 7/15: EF 60-65 Hyperlipidemia  Hypertension  Asthma  Hx of Breast CA   Prior CV Studies:   Echocardiogram 06/10/14 EF 60-65, no RWMA, trivial MR, normal RVSF, mild TR  Myoview 06/25/11 EF 80, normal study  Cardiac catheterization 07/25/2003 LAD mid 75 LCx Minor irregularities EF 30 with anterior apex, apex and inferior apical AK PCI: 3.0 x 20-mm Taxus stent to m LAD   History of Present Illness:   [name] [name] is a 76 y.o. female with the above problem list.  She was last seen by Dr. Acie Fredrickson in 09/2021.  She was recently seen in the emergency room with palpitations and chest pain on 09/18/2022.  High sensitivity troponins were negative x2.  K+ was low at 3.4.  Creatinine was normal at 0.7.  Hemoglobin was normal at 14.7.  Chest x-ray did not demonstrate any edema or consolidation. EKG demonstrated sinus rhythm with PACs and PVCs, QTc 481, nonspecific ST-T wave changes.    She returns for follow-up.  She is here alone.  She really did not experience chest discomfort.  She had more of a sensation of palpitations in her chest.  She had used albuterol several times that day.  She is concerned her potassium is low.  Question if her potassium is low secondary to albuterol use.  She has not really had any chest discomfort with exertion.  She has not had shortness of breath, syncope, orthopnea or leg edema.  She has not had any recurrent palpitations.  Palpitations are fairly infrequent.     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 NSTEMI 8/04 => LHC 07/2003:  mLAD hazy 75% => Taxus DES, EF 30% with ant-apical, apical and inf-apical AK.  b.  Echo 4/05: EF 60%.  c.  ETT/Lexiscan Myoview 06/2011: EF 80%, no ischemia, normal wall motion   Cataract    Diverticulosis 11/13/2004   GERD (gastroesophageal reflux disease)    Hyperplastic colon polyp 10/16/1999   Hypertension    IBS (irritable bowel syndrome)    Internal and external hemorrhoids without complication 81/19/1478   MI (myocardial infarction) ([place]) 12/03/2002   Anteroapical MI with PCI to the LAD   Positive PPD    PVC (premature ventricular contraction)    Current Medications: Current Meds  Medication Sig   acetaminophen (TYLENOL) 325 MG tablet Take 650 mg by mouth every 6 (six) hours as needed for mild pain.    albuterol (VENTOLIN HFA) 108 (90 Base) MCG/ACT inhaler Inhale 2 puffs into the lungs every 4 (four) hours as needed for shortness of breath.   clopidogrel (PLAVIX) 75 MG tablet Take 1 tablet (75 mg total) by mouth daily.   denosumab (PROLIA) 60 MG/ML SOSY injection  Inject 60 mg into the skin every 6 (six) months.   escitalopram (LEXAPRO) 10 MG tablet Take 10 mg by mouth daily.   FLOVENT HFA 220 MCG/ACT inhaler Inhale 1 puff into the lungs 2 (two) times daily.   fluticasone (FLONASE) 50 MCG/ACT nasal spray Place 1 spray into both nostrils 2 (two) times daily.   hydrochlorothiazide (HYDRODIURIL) 25 MG tablet Take 1 tablet (25 mg total) by mouth daily.   irbesartan (AVAPRO) 150 MG tablet Take 150 mg by mouth daily.   ketotifen (ZADITOR) 0.025 % ophthalmic solution Place 1 drop into both eyes 2 (two) times daily as needed (allergies).   nebivolol (BYSTOLIC) 2.5 MG tablet Take 1 tablet (2.5 mg total) by mouth daily.   pantoprazole (PROTONIX)  40 MG tablet Take 1 tablet (40 mg total) by mouth daily.   polyvinyl alcohol (LIQUIFILM TEARS) 1.4 % ophthalmic solution Place 1 drop into both eyes as needed for dry eyes.   potassium chloride SA (KLOR-CON M) 20 MEQ tablet Take 20 mEq by mouth 2 (two) times daily.   PREMARIN vaginal cream Place 0.5 Applicatorfuls vaginally 2 (two) times a week. On Tuesday and Saturday     Use as directed   rosuvastatin (CRESTOR) 5 MG tablet Take 1 tablet (5 mg total) by mouth daily.   Current Facility-Administered Medications for the 09/21/22 encounter (Office Visit) with Richardson Dopp T, PA-C  Medication   0.9 %  sodium chloride infusion    Allergies:   Clindamycin, Asa [aspirin], Codeine, Lisinopril, Simvastatin, and Cephalexin   Social History   Tobacco Use   Smoking status: Former    Types: Cigarettes    Quit date: 08/07/1966    Years since quitting: 56.1   Smokeless tobacco: Never  Vaping Use   Vaping Use: Never used  Substance Use Topics   Alcohol use: Not Currently   Drug use: No    Family Hx: The patient's family history includes Asthma in her daughter; Breast cancer in her mother and sister; Heart disease in her maternal grandmother; Spina bifida in an other family member. There is no history of Colon cancer, Esophageal cancer, Rectal cancer, or Stomach cancer.  Review of Systems  Gastrointestinal:  Negative for hematochezia.  Genitourinary:  Negative for hematuria.     EKGs/Labs/Other Test Reviewed:    EKG:  EKG is not ordered today.    Recent Labs: 09/18/2022: ALT 14; BUN 12; Creatinine, Ser 0.87; Hemoglobin 14.7; Platelets 321; Potassium 3.4; Sodium 141   Recent Lipid Panel No results for input(s): "CHOL", "TRIG", "HDL", "VLDL", "LDLCALC", "LDLDIRECT" in the last 8760 hours.   Risk Assessment/Calculations/Metrics:              Physical Exam:    VS:  BP 132/78   Pulse 74   Ht '5\' 1"'$  (1.549 m)   Wt 137 lb (62.1 kg)   SpO2 96%   BMI 25.89 kg/m     Wt Readings from  Last 3 Encounters:  09/21/22 137 lb (62.1 kg)  09/18/22 131 lb (59.4 kg)  01/26/22 133 lb 8 oz (60.6 kg)    Constitutional:      Appearance: Healthy appearance. Not in distress.  Neck:     Vascular: JVD normal.  Pulmonary:     Effort: Pulmonary effort is normal.     Breath sounds: No wheezing. No rales.  Cardiovascular:     Normal rate. Regular rhythm. Normal S1. Normal S2.      Murmurs: There is no murmur.  Edema:  Peripheral edema absent.  Abdominal:     Palpations: Abdomen is soft.  Skin:    General: Skin is warm and dry.  Neurological:     Mental Status: Alert and oriented to person, place and time.         ASSESSMENT & PLAN:   Palpitations She was recently seen in the emergency room with palpitations.  This occurred in the setting of using albuterol for asthma symptoms.  She has not had any recurrence.  She rarely has palpitations.  We discussed the pros and cons of ordering a monitor at this time.  I have recommended watchful waiting.  She agrees with this.  If she has recurrent/frequent palpitations, she will contact us.  I would recommend proceeding with Zio patch monitor at that time.    Hypokalemia Recent potassium 3.4.  This occurred in the context of using albuterol several times for asthma symptoms.  We discussed the possibility of stopping HCTZ and potassium and replacing it with spironolactone.  She prefers to continue her current medications for now.  Obtain follow-up BMET today.  If potassium is not 4 or higher, I will adjust her supplementation dose.  CAD (coronary artery disease) History of non-STEMI in 2014 with a Taxus DES to the LAD.  Stress test in 2012 was low risk.  She remains fairly active without symptoms of angina.  Continue Plavix 75 mg daily, Crestor 5 mg daily.  Follow-up in 1 year.  Essential hypertension Controlled.  Continue HCTZ 25 mg daily, irbesartan 150 mg daily, nebivolol 2.5 mg daily  Hyperlipidemia Labs from [facility] personally reviewed  and interpreted.  09/10/2022: Total cholesterol 134, HDL 52, LDL 65, triglycerides 89.  LDL optimal.  Continue rosuvastatin 5 mg daily.           Dispo:  Return in about 1 year (around 09/22/2023) for Routine Follow Up, w/ Dr. Acie Fredrickson.   Medication Adjustments/Labs and Tests Ordered: Current medicines are reviewed at length with the patient today.  Concerns regarding medicines are outlined above.  Tests Ordered: Orders Placed This Encounter  Procedures   Basic metabolic panel   Medication Changes: No orders of the defined types were placed in this encounter.  Signed, Richardson Dopp, PA-C  09/21/2022 1:26 PM    [address], [place], [place]  [address] Phone: [phone]; Fax: [phone]

## 2022-09-21 ENCOUNTER — Ambulatory Visit: Payer: Medicare Other | Attending: Physician Assistant | Admitting: Physician Assistant

## 2022-09-21 ENCOUNTER — Encounter: Payer: Self-pay | Admitting: Physician Assistant

## 2022-09-21 VITALS — BP 132/78 | HR 74 | Ht 61.0 in | Wt 137.0 lb

## 2022-09-21 DIAGNOSIS — I2510 Atherosclerotic heart disease of native coronary artery without angina pectoris: Secondary | ICD-10-CM | POA: Diagnosis not present

## 2022-09-21 DIAGNOSIS — I10 Essential (primary) hypertension: Secondary | ICD-10-CM

## 2022-09-21 DIAGNOSIS — E7800 Pure hypercholesterolemia, unspecified: Secondary | ICD-10-CM

## 2022-09-21 DIAGNOSIS — E876 Hypokalemia: Secondary | ICD-10-CM | POA: Diagnosis not present

## 2022-09-21 DIAGNOSIS — R002 Palpitations: Secondary | ICD-10-CM

## 2022-09-21 NOTE — Assessment & Plan Note (Signed)
Controlled.  Continue HCTZ 25 mg daily, irbesartan 150 mg daily, nebivolol 2.5 mg daily

## 2022-09-21 NOTE — Assessment & Plan Note (Signed)
Labs from [facility] personally reviewed and interpreted.  09/10/2022: Total cholesterol 134, HDL 52, LDL 65, triglycerides 89.  LDL optimal.  Continue rosuvastatin 5 mg daily.

## 2022-09-21 NOTE — Assessment & Plan Note (Signed)
History of non-STEMI in 2014 with a Taxus DES to the LAD.  Stress test in 2012 was low risk.  She remains fairly active without symptoms of angina.  Continue Plavix 75 mg daily, Crestor 5 mg daily.  Follow-up in 1 year.

## 2022-09-21 NOTE — Patient Instructions (Signed)
Medication Instructions:  Your physician recommends that you continue on your current medications as directed. Please refer to the Current Medication list given to you today.  *If you need a refill on your cardiac medications before your next appointment, please call your pharmacy*   Lab Work: TODAY:  BMET   If you have labs (blood work) drawn today and your tests are completely normal, you will receive your results only by: [place] (if you have MyChart) OR A paper copy in the mail If you have any lab test that is abnormal or we need to change your treatment, we will call you to review the results.   Testing/Procedures: None ordered   Follow-Up: At [facility], you and your health needs are our priority.  As part of our continuing mission to provide you with exceptional heart care, we have created designated Provider Care Teams.  These Care Teams include your primary Cardiologist (physician) and Advanced Practice Providers (APPs -  Physician Assistants and Nurse Practitioners) who all work together to provide you with the care you need, when you need it.  We recommend signing up for the patient portal called "MyChart".  Sign up information is provided on this After Visit Summary.  MyChart is used to connect with patients for Virtual Visits (Telemedicine).  Patients are able to view lab/test results, encounter notes, upcoming appointments, etc.  Non-urgent messages can be sent to your provider as well.   To learn more about what you can do with MyChart, go to [email or web address].    Your next appointment:   1 year(s)  The format for your next appointment:   In Person  Provider:   Mertie Moores, MD     Other Instructions   Important Information About Sugar

## 2022-09-21 NOTE — Assessment & Plan Note (Signed)
She was recently seen in the emergency room with palpitations.  This occurred in the setting of using albuterol for asthma symptoms.  She has not had any recurrence.  She rarely has palpitations.  We discussed the pros and cons of ordering a monitor at this time.  I have recommended watchful waiting.  She agrees with this.  If she has recurrent/frequent palpitations, she will contact us.  I would recommend proceeding with Zio patch monitor at that time.

## 2022-09-21 NOTE — Assessment & Plan Note (Signed)
Recent potassium 3.4.  This occurred in the context of using albuterol several times for asthma symptoms.  We discussed the possibility of stopping HCTZ and potassium and replacing it with spironolactone.  She prefers to continue her current medications for now.  Obtain follow-up BMET today.  If potassium is not 4 or higher, I will adjust her supplementation dose.

## 2022-09-22 LAB — BASIC METABOLIC PANEL
BUN/Creatinine Ratio: 18 (ref 12–28)
BUN: 15 mg/dL (ref 8–27)
CO2: 25 mmol/L (ref 20–29)
Calcium: 8.8 mg/dL (ref 8.7–10.3)
Chloride: 101 mmol/L (ref 96–106)
Creatinine, Ser: 0.85 mg/dL (ref 0.57–1.00)
Glucose: 68 mg/dL — ABNORMAL LOW (ref 70–99)
Potassium: 3.9 mmol/L (ref 3.5–5.2)
Sodium: 141 mmol/L (ref 134–144)
eGFR: 71 mL/min/{1.73_m2} (ref >59)

## 2022-09-24 ENCOUNTER — Telehealth: Payer: Self-pay | Admitting: *Deleted

## 2022-09-24 DIAGNOSIS — Z79899 Other long term (current) drug therapy: Secondary | ICD-10-CM

## 2022-09-24 MED ORDER — POTASSIUM CHLORIDE CRYS ER 20 MEQ PO TBCR: 30.0000 meq | EXTENDED_RELEASE_TABLET | Freq: Two times a day (BID) | ORAL | 3 refills | Status: DC

## 2022-09-24 MED ORDER — POTASSIUM CHLORIDE CRYS ER 20 MEQ PO TBCR: 30.0000 meq | EXTENDED_RELEASE_TABLET | Freq: Every day | ORAL | 3 refills | Status: DC

## 2022-09-24 NOTE — Addendum Note (Signed)
Addended by: Gaetano Net on: 09/24/2022 08:08 AM   Modules accepted: Orders

## 2022-09-24 NOTE — Telephone Encounter (Signed)
-----   Message from Liliane Shi, [place] sent at 09/23/2022  1:43 PM EDT ----- Creatinine normal. K+ ok but should try to get it to 4 to prevent low K+ PLAN:  -Increase K+ to 30 mEq twice daily  -BMET 1 week Richardson Dopp, PA-C    09/23/2022 1:40 PM

## 2022-09-26 DIAGNOSIS — Z0000 Encounter for general adult medical examination without abnormal findings: Secondary | ICD-10-CM | POA: Diagnosis not present

## 2022-09-28 ENCOUNTER — Ambulatory Visit: Payer: Medicare Other | Attending: Physician Assistant

## 2022-09-28 DIAGNOSIS — Z79899 Other long term (current) drug therapy: Secondary | ICD-10-CM

## 2022-09-29 LAB — BASIC METABOLIC PANEL
BUN/Creatinine Ratio: 22 (ref 12–28)
BUN: 19 mg/dL (ref 8–27)
CO2: 25 mmol/L (ref 20–29)
Calcium: 8.8 mg/dL (ref 8.7–10.3)
Chloride: 103 mmol/L (ref 96–106)
Creatinine, Ser: 0.87 mg/dL (ref 0.57–1.00)
Glucose: 75 mg/dL (ref 70–99)
Potassium: 4 mmol/L (ref 3.5–5.2)
Sodium: 143 mmol/L (ref 134–144)
eGFR: 69 mL/min/{1.73_m2} (ref >59)

## 2022-10-19 ENCOUNTER — Encounter: Payer: Self-pay | Admitting: Nurse Practitioner

## 2022-10-19 ENCOUNTER — Other Ambulatory Visit (INDEPENDENT_AMBULATORY_CARE_PROVIDER_SITE_OTHER): Payer: Medicare Other

## 2022-10-19 ENCOUNTER — Ambulatory Visit: Payer: Medicare Other | Admitting: Nurse Practitioner

## 2022-10-19 VITALS — BP 130/78 | Ht 61.0 in | Wt 137.0 lb

## 2022-10-19 DIAGNOSIS — Z9889 Other specified postprocedural states: Secondary | ICD-10-CM | POA: Diagnosis not present

## 2022-10-19 DIAGNOSIS — R1032 Left lower quadrant pain: Secondary | ICD-10-CM

## 2022-10-19 DIAGNOSIS — E538 Deficiency of other specified B group vitamins: Secondary | ICD-10-CM | POA: Diagnosis not present

## 2022-10-19 LAB — CBC WITH DIFFERENTIAL/PLATELET
Basophils Absolute: 0 10*3/uL (ref 0.0–0.1)
Basophils Relative: 1 % (ref 0.0–3.0)
Eosinophils Absolute: 0.1 10*3/uL (ref 0.0–0.7)
Eosinophils Relative: 1.5 % (ref 0.0–5.0)
HCT: 40.9 % (ref 36.0–46.0)
Hemoglobin: 13.4 g/dL (ref 12.0–15.0)
Lymphocytes Relative: 20.8 % (ref 12.0–46.0)
Lymphs Abs: 1.1 10*3/uL (ref 0.7–4.0)
MCHC: 32.8 g/dL (ref 30.0–36.0)
MCV: 91.9 fl (ref 78.0–100.0)
Monocytes Absolute: 0.6 10*3/uL (ref 0.1–1.0)
Monocytes Relative: 11.8 % (ref 3.0–12.0)
Neutro Abs: 3.3 10*3/uL (ref 1.4–7.7)
Neutrophils Relative %: 64.9 % (ref 43.0–77.0)
Platelets: 310 10*3/uL (ref 150.0–400.0)
RBC: 4.45 Mil/uL (ref 3.87–5.11)
RDW: 14.2 % (ref 11.5–15.5)
WBC: 5.1 10*3/uL (ref 4.0–10.5)

## 2022-10-19 LAB — COMPREHENSIVE METABOLIC PANEL
ALT: 11 U/L (ref 0–35)
AST: 17 U/L (ref 0–37)
Albumin: 4.1 g/dL (ref 3.5–5.2)
Alkaline Phosphatase: 70 U/L (ref 39–117)
BUN: 17 mg/dL (ref 6–23)
CO2: 29 mEq/L (ref 19–32)
Calcium: 8.7 mg/dL (ref 8.4–10.5)
Chloride: 104 mEq/L (ref 96–112)
Creatinine, Ser: 0.92 mg/dL (ref 0.40–1.20)
GFR: 59.84 mL/min — ABNORMAL LOW (ref >60.00)
Glucose, Bld: 77 mg/dL (ref 70–99)
Potassium: 3.6 mEq/L (ref 3.5–5.1)
Sodium: 140 mEq/L (ref 135–145)
Total Bilirubin: 0.7 mg/dL (ref 0.2–1.2)
Total Protein: 6.9 g/dL (ref 6.0–8.3)

## 2022-10-19 LAB — VITAMIN B12: Vitamin B-12: 329 pg/mL (ref 211–911)

## 2022-10-19 NOTE — Patient Instructions (Addendum)
_______________________________________________________  If you are age 78 or older, your body mass index should be between 23-30. Your Body mass index is 25.89 kg/m. If this is out of the aforementioned range listed, please consider follow up with your Primary Care Provider.  If you are age 18 or younger, your body mass index should be between 19-25. Your Body mass index is 25.89 kg/m. If this is out of the aformentioned range listed, please consider follow up with your Primary Care Provider.   ________________________________________________________  The [place] [facility] providers would like to encourage you to use [facility] to communicate with providers for non-urgent requests or questions.  Due to long hold times on the telephone, sending your provider a message by [facility] may be a faster and more efficient way to get a response.  Please allow 48 business hours for a response.  Please remember that this is for non-urgent requests.  _______________________________________________________  Dennis Bast have been scheduled for a CT scan of the abdomen and pelvis at [facility], 1st floor Radiology. You are scheduled on 10-26-2022 at 3pm . You should arrive 2 hours prior to your appointment time for registration and contrast.   You may take any medications as prescribed with a small amount of water, if necessary. If you take any of the following medications: METFORMIN, GLUCOPHAGE, GLUCOVANCE, AVANDAMET, RIOMET, FORTAMET, [place] MET, JANUMET, GLUMETZA or METAGLIP, you MAY be asked to HOLD this medication 48 hours AFTER the exam.   The purpose of you drinking the oral contrast is to aid in the visualization of your intestinal tract. The contrast solution may cause some diarrhea. Depending on your individual set of symptoms, you may also receive an intravenous injection of x-ray contrast/dye. Plan on being at [facility] for 45 minutes or longer, depending on the type of exam you are having performed.   If  you have any questions regarding your exam or if you need to reschedule, you may call [facility] at [phone] between the hours of 8:00 am and 5:00 pm, Monday-Friday.     Miralax once a day as needed.  IBgard 1 po BID, as needed for abdominal pain  Please go to the ER if you develop severe abdominal pain.  It was a pleasure to see you today!  Thank you for trusting me with your gastrointestinal care!

## 2022-10-19 NOTE — Progress Notes (Signed)
10/19/2022 [name] [name] [id number] 1946-08-05   Chief Complaint: Lower abdominal pain   History of Present Illness: [name] [name] is a 76 year old female with a past medical history of anxiety, breast cancer, hypertension, coronary artery disease s/p NSTEMI s/p DES 2004 on Plavix, B12 deficiency, GERD, IBS and colon polyps.  She complains of having 2 episodes of IBS flares over the past few months. The first episode occurred about 2 months ago which consisted of LLQ pain which radiated across to the RLQ with nonbloody loose diarrhea stools which lasted for 4 to 5 days then resolved.  She had a similar second flare of lower abdominal pain and diarrhea which started 5 days ago and has nearly abated.  She had very mild LLQ discomfort this morning.  She passed a loose blood like stool yesterday, no BM yet today.  No fevers.  She has mild nausea without vomiting.  She took Simethicone a few times with slight symptom relief.  She denies ever being diagnosed with diverticulitis.  Her most recent colonoscopy was 05/02/2016 which identified one 3 mm tubular adenomatous polyp removed from the ascending colon and severe diverticulosis throughout the entire colon.  She was advised to repeat a colonoscopy in 5 years.  She was admitted to the hospital 06/20/2021 with epigastric pain and melena. CTAP showed minor wall thickening of the sigmoid colon in the region of multiple colonic diverticula without definitive diverticulitis.  Her admission hemoglobin level was 7.9.  He underwent an upper endoscopy which identified a nonbleeding gastric ulcer with a clean base for which she was prescribed Pantoprazole 40 mg twice daily.  Biopsies were negative for H. pylori.  Her clinical status stabilized and she was discharged home on 06/24/2021.  A follow-up EGD as an outpatient was completed 08/23/2021 showed a nonbleeding gastric ulcer, a single gastric polyp and grade B reflux esophagitis.  She was instructed to  continue Pantoprazole 40 mg twice daily for 3 months then once daily and to take Sucralfate 1 g p.o. twice daily for 2 weeks.  She denies having any dark stools or rectal bleeding since she was discharged from the hospital 06/2021.  PAST GI PROCEDURES:  EGD 08/23/2021: - LA Grade B reflux esophagitis with no bleeding. - Non-bleeding gastric ulcer with no stigmata of bleeding. Biopsied. - A single gastric polyp. Biopsied. - Normal examined duodenum. 1. Surgical [P], gastric antrum ulcer - GASTRIC ANTRAL MUCOSA SHOWING MARKED NONSPECIFIC REACTIVE GASTROPATHY WITH EROSION - WARTHIN STARRY STAIN IS NEGATIVE FOR HELICOBACTER PYLORI 2. Surgical [P], gastric cardia polyp - FUNDIC GLAND POLYP(S) - NEGATIVE FOR DYSPLASIA  EGD 06/21/2021: - Esophagogastric landmarks identified. - 2 cm hiatal hernia. - Normal esophagus otherwise - Non-bleeding gastric ulcers with a clean ulcer base (Forrest Class III), as described above. Biopsied. - Normal stomach otherwise - biopsies taken to rule out H pylori - Normal duodenal bulb and second portion of the duodenum. No heme noted anywhere in the bowel, she has stopped bleeding. A. STOMACH, ULCER, BIOPSY:  - Reactive changes suggestive of adjacent ulcer.  - No intestinal metaplasia, dysplasia, or malignancy.  B. STOMACH, BIOPSY:  - Mild chronic gastritis.  - Warthin-Starry is negative for Helicobacter pylori.  - No intestinal metaplasia, dysplasia, or malignancy.   Colonoscopy 05/02/2016: - One 3 mm polyp in the ascending colon, removed with a cold biopsy forceps. Resected and retrieved. - Severe Diverticulosis in the entire examined colon. - Non-bleeding internal hemorrhoids. - 5 year colonoscopy recall Surgical [P],  ascending polyp - TUBULAR ADENOMA(S). - HIGH GRADE DYSPLASIA IS NOT IDENTIFIED      Latest Ref Rng & Units 09/18/2022   10:06 AM 01/26/2022   11:47 AM 01/26/2022   11:29 AM  CBC  WBC 4.0 - 10.5 K/uL 8.6     Hemoglobin 12.0 - 15.0  g/dL 14.7  13.9    Hematocrit 36.0 - 46.0 % 46.2   42.3   Platelets 150 - 400 K/uL 321          Latest Ref Rng & Units 09/28/2022    3:10 PM 09/21/2022   12:27 PM 09/18/2022   10:47 AM  CMP  Glucose 70 - 99 mg/dL 75  68  97   BUN 8 - 27 mg/dL '19  15  12   '$ Creatinine 0.57 - 1.00 mg/dL 0.87  0.85  0.87   Sodium 134 - 144 mmol/L 143  141  141   Potassium 3.5 - 5.2 mmol/L 4.0  3.9  3.4   Chloride 96 - 106 mmol/L 103  101  106   CO2 20 - 29 mmol/L '25  25  25   '$ Calcium 8.7 - 10.3 mg/dL 8.8  8.8  9.0   Total Protein 6.5 - 8.1 g/dL   6.9   Total Bilirubin 0.3 - 1.2 mg/dL   1.1   Alkaline Phos 38 - 126 U/L   67   AST 15 - 41 U/L   20   ALT 0 - 44 U/L   14     IMAGE STUDIES:  CTAP 06/20/2021: FINDINGS: Lower chest: Linear atelectasis or scarring in the right lower lobe. 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 No confluent airspace disease or pleural effusion.   Hepatobiliary: Scattered low-density lesions throughout the liver are all subcentimeter, similar to prior exam and likely cysts. No suspicious liver lesion. Gallbladder physiologically distended, no calcified stone. No biliary dilatation.   Pancreas: No ductal dilatation or inflammation.   Spleen: Normal in size without focal abnormality.   Adrenals/Urinary Tract: No adrenal nodule. No hydronephrosis. There are bilateral parapelvic cysts. 6 mm nonobstructing stone in the lower left kidney there is homogeneous renal enhancement with symmetric excretion on delayed phase imaging. Partially distended and unremarkable urinary bladder.   Stomach/Bowel: Decompressed stomach. Fall there is no small bowel obstruction or inflammation. No small bowel wall thickening. Occasional fecalization of distal small bowel contents. Appendix not visualized, reported history of appendectomy. There is multifocal colonic diverticulosis. Equivocal minor wall thickening of the sigmoid colon, for example series 2, image 50, but no  focal diverticulitis or pericolonic inflammation. No liquid stool in the colon. Small volume of formed stool.   Vascular/Lymphatic: Aortic atherosclerosis. No aortic aneurysm. Patent portal and mesenteric veins. No portal venous or mesenteric gas. No abdominopelvic adenopathy.   Reproductive: Uterus is not visualized, presumably surgically absent. No adnexal mass   Other: No ascites. No free air. No inflammatory change in the abdomen or pelvis. Abdominal wall hernia.   Musculoskeletal: There are no acute or suspicious osseous abnormalities. Chronic appearing anterior wedging of L1 vertebral body. L5-S1 degenerative disc disease.   IMPRESSION: 1. Equivocal minor wall thickening of the sigmoid colon in the region of multiple colonic diverticula. This may represent minimal colitis, but no focally inflamed diverticulum to suggest diverticulitis. No CT findings to suggest C diff colitis. 2. No other acute abnormality in the abdomen/pelvis. 3. Nonobstructing left renal stone. Bilateral parapelvic cysts.  Aortic Atherosclerosis   Current Outpatient Medications on File  Prior to Visit  Medication Sig Dispense Refill   acetaminophen (TYLENOL) 325 MG tablet Take 650 mg by mouth every 6 (six) hours as needed for mild pain.      albuterol (VENTOLIN HFA) 108 (90 Base) MCG/ACT inhaler Inhale 2 puffs into the lungs every 4 (four) hours as needed for shortness of breath.     clopidogrel (PLAVIX) 75 MG tablet Take 1 tablet (75 mg total) by mouth daily. 90 tablet 3   denosumab (PROLIA) 60 MG/ML SOSY injection Inject 60 mg into the skin every 6 (six) months.     escitalopram (LEXAPRO) 10 MG tablet Take 10 mg by mouth daily.     FLOVENT HFA 220 MCG/ACT inhaler Inhale 1 puff into the lungs 2 (two) times daily.     fluticasone (FLONASE) 50 MCG/ACT nasal spray Place 1 spray into both nostrils 2 (two) times daily.     hydrochlorothiazide (HYDRODIURIL) 25 MG tablet Take 1 tablet (25 mg total) by mouth  daily. 90 tablet 3   irbesartan (AVAPRO) 150 MG tablet Take 150 mg by mouth daily.     ketotifen (ZADITOR) 0.025 % ophthalmic solution Place 1 drop into both eyes 2 (two) times daily as needed (allergies).     nebivolol (BYSTOLIC) 2.5 MG tablet Take 1 tablet (2.5 mg total) by mouth daily. 30 tablet 11   pantoprazole (PROTONIX) 40 MG tablet Take 1 tablet (40 mg total) by mouth daily. 90 tablet 3   polyvinyl alcohol (LIQUIFILM TEARS) 1.4 % ophthalmic solution Place 1 drop into both eyes as needed for dry eyes.     potassium chloride SA (KLOR-CON M) 20 MEQ tablet Take 1.5 tablets (30 mEq total) by mouth 2 (two) times daily. 270 tablet 3   PREMARIN vaginal cream Place 0.5 Applicatorfuls vaginally 2 (two) times a week. On Tuesday and Saturday     Use as directed     rosuvastatin (CRESTOR) 5 MG tablet Take 1 tablet (5 mg total) by mouth daily. 30 tablet 6   Current Facility-Administered Medications on File Prior to Visit  Medication Dose Route Frequency Provider Last Rate Last Admin   0.9 %  sodium chloride infusion  500 mL Intravenous Once Nandigam, Venia Minks, MD       Allergies  Allergen Reactions   Clindamycin Diarrhea and Nausea And Vomiting    Blood in diarrhea   Asa [Aspirin] Other (See Comments)    GI bleed   Codeine Other (See Comments)    headache   Lisinopril Cough   Simvastatin Diarrhea   Cephalexin Other (See Comments) and Rash   Current Medications, Allergies, Past Medical History, Past Surgical History, Family History and Social History were reviewed in [employer] record.  Review of Systems:   Constitutional: Negative for fever, sweats, chills or weight loss.  Respiratory: Negative for shortness of breath.   Cardiovascular: Negative for chest pain, palpitations and leg swelling.  Gastrointestinal: See HPI.  Musculoskeletal: Negative for back pain or muscle aches.  Neurological: Negative for dizziness, headaches or paresthesias.   Physical Exam: BP  130/78   Ht '5\' 1"'$  (1.549 m)   Wt 137 lb (62.1 kg)   BMI 25.89 kg/m   Wt Readings from Last 3 Encounters:  10/19/22 137 lb (62.1 kg)  09/21/22 137 lb (62.1 kg)  09/18/22 131 lb (59.4 kg)    General: 76 year old female in no acute distress. Head: Normocephalic and atraumatic. Eyes: No scleral icterus. Conjunctiva pink . Ears: Normal auditory acuity. Mouth: Dentition intact.  No ulcers or lesions.  Lungs: Clear throughout to auscultation. Heart: Regular rate and rhythm, no murmur. Abdomen: Soft, nondistended. Tenderness throughout the lower abdomen without rebound or guarding. No masses or hepatomegaly. Normal bowel sounds x 4 quadrants.  Rectal: Deferred.  Musculoskeletal: Symmetrical with no gross deformities. Extremities: No edema. Neurological: Alert oriented x 4. No focal deficits.  Psychological: Alert and cooperative. Normal mood and affect  Assessment and Recommendations:  80) 76 year old female with pandiverticulosis with LLQ pain which radiates to the RLQ with associated loose stools x 5 days x 2 flares. The first flare occurred about two months ago and the second flare started this past weekend and has nearly resolved at this time. No BM today. She has mild lower abdominal tenderness without rebound or guarding on exam today. -CBC, CMP,  -CTAP with oral and IV contrast to rule out diverticulitis/colits.  Creatinine/GFR level to be reviewed prior to the patient receiving IV contrast. -Push fluids, bland diet as tolerated -IBgard 1 p.o. twice daily for abdominal pain OTC -Patient instructed to go to the emergency room if she develops severe abdominal pain or if she develops any rectal bleeding  2) History of UGI bleed/anemia 06/2021 likely due to gastric ulcer identified per EGD during her hospitalization 01/22/2021. Repeat EGD 9/20 12/2020 showed grade B reflux esophagitis, nonbleeding gastric ulcer and a fundic gland polyp.  No evidence of H. pylori or malignancy.  She was  prescribed PPI twice daily for 3 months then once daily and Sucralfate twice daily for 2 weeks.  -Continue Pantoprazole 40 mg once daily indefinitely  3) CAD s/p NSTEMI s/p DES to LAD 07/2003 on Plavix  4) History of a 3 mm tubular adenomatous polyp removed from the ascending colon per colonoscopy 04/2016.  -Await CTAP prior to pursuing a colon polyp surveillance colonoscopy  5) History of B12 deficiency, patient request to have been 12 level checked today

## 2022-10-26 ENCOUNTER — Ambulatory Visit (HOSPITAL_COMMUNITY)
Admission: RE | Admit: 2022-10-26 | Discharge: 2022-10-26 | Disposition: A | Discharge status: Home / Self Care | Payer: Medicare Other | Source: Ambulatory Visit | Attending: Nurse Practitioner | Admitting: Nurse Practitioner

## 2022-10-26 DIAGNOSIS — R1032 Left lower quadrant pain: Secondary | ICD-10-CM | POA: Insufficient documentation

## 2022-10-26 DIAGNOSIS — N200 Calculus of kidney: Secondary | ICD-10-CM | POA: Diagnosis not present

## 2022-10-26 DIAGNOSIS — E538 Deficiency of other specified B group vitamins: Secondary | ICD-10-CM | POA: Insufficient documentation

## 2022-10-26 DIAGNOSIS — N281 Cyst of kidney, acquired: Secondary | ICD-10-CM | POA: Diagnosis not present

## 2022-10-26 MED ORDER — SODIUM CHLORIDE (PF) 0.9 % IJ SOLN
INTRAMUSCULAR | Status: AC
  Filled 2022-10-26: qty 50

## 2022-10-26 MED ORDER — IOHEXOL 300 MG/ML  SOLN
100.0000 mL | Freq: Once | INTRAMUSCULAR | Status: AC | PRN
  Administered 2022-10-26: 100 mL via INTRAVENOUS

## 2022-10-29 NOTE — Progress Notes (Signed)
If patient is worried about recent change in bowel habits we can consider colonoscopy for further evaluation.

## 2022-11-06 ENCOUNTER — Encounter: Payer: Self-pay | Admitting: Pulmonary Disease

## 2022-11-06 ENCOUNTER — Ambulatory Visit: Payer: Medicare Other | Admitting: Pulmonary Disease

## 2022-11-06 VITALS — BP 122/80 | HR 56 | Temp 97.7°F | Ht 61.0 in | Wt 136.8 lb

## 2022-11-06 DIAGNOSIS — J4531 Mild persistent asthma with (acute) exacerbation: Secondary | ICD-10-CM | POA: Diagnosis not present

## 2022-11-06 MED ORDER — FLOVENT HFA 220 MCG/ACT IN AERO: 2.0000 | INHALATION_SPRAY | Freq: Two times a day (BID) | RESPIRATORY_TRACT | 6 refills | Status: DC

## 2022-11-06 NOTE — Progress Notes (Signed)
[name] [name]    [id number]    18-Mar-1946  Primary Care Physician:McNeill, Abigail Butts, MD  Referring Physician: Cari Caraway, [place],  [place] [address]  Chief complaint:   Patient being seen for uncontrolled asthma  HPI:  Daily symptoms of asthma Waking up at night with cough and breathing difficulties  She is on Flovent 1 puff twice a day  Not tolerant of beta agonist  Steroids also to give her insomnia  She is not feeling acutely ill  Has no fevers or chills  She recently fostered rabbits and this may be associated with worsening symptoms  We did discuss [place] hernia found on recent abdominal CT, same hernia was described on previous CT from about 16 months ago.  She does have nasal stuffiness and congestion for which she uses Flonase  Reformed smoker, quit many many years ago  Outpatient Encounter Medications as of 11/06/2022  Medication Sig   acetaminophen (TYLENOL) 325 MG tablet Take 650 mg by mouth every 6 (six) hours as needed for mild pain.    albuterol (VENTOLIN HFA) 108 (90 Base) MCG/ACT inhaler Inhale 2 puffs into the lungs every 4 (four) hours as needed for shortness of breath.   clopidogrel (PLAVIX) 75 MG tablet Take 1 tablet (75 mg total) by mouth daily.   denosumab (PROLIA) 60 MG/ML SOSY injection Inject 60 mg into the skin every 6 (six) months.   escitalopram (LEXAPRO) 10 MG tablet Take 10 mg by mouth daily.   FLOVENT HFA 220 MCG/ACT inhaler Inhale 1 puff into the lungs 2 (two) times daily.   fluticasone (FLONASE) 50 MCG/ACT nasal spray Place 1 spray into both nostrils 2 (two) times daily.   hydrochlorothiazide (HYDRODIURIL) 25 MG tablet Take 1 tablet (25 mg total) by mouth daily.   irbesartan (AVAPRO) 150 MG tablet Take 150 mg by mouth daily.   ketotifen (ZADITOR) 0.025 % ophthalmic solution Place 1 drop into both eyes 2 (two) times daily as needed (allergies).   nebivolol (BYSTOLIC) 2.5 MG tablet Take 1 tablet (2.5  mg total) by mouth daily.   pantoprazole (PROTONIX) 40 MG tablet Take 1 tablet (40 mg total) by mouth daily.   polyvinyl alcohol (LIQUIFILM TEARS) 1.4 % ophthalmic solution Place 1 drop into both eyes as needed for dry eyes.   potassium chloride SA (KLOR-CON M) 20 MEQ tablet Take 1.5 tablets (30 mEq total) by mouth 2 (two) times daily.   PREMARIN vaginal cream Place 0.5 Applicatorfuls vaginally 2 (two) times a week. On Tuesday and Saturday     Use as directed   rosuvastatin (CRESTOR) 5 MG tablet Take 1 tablet (5 mg total) by mouth daily.   Facility-Administered Encounter Medications as of 11/06/2022  Medication   0.9 %  sodium chloride infusion    Allergies as of 11/06/2022 - Review Complete 11/06/2022  Allergen Reaction Noted   Clindamycin Diarrhea and Nausea And Vomiting 06/21/2021   Asa [aspirin] Other (See Comments) 09/15/2021   Codeine Other (See Comments) 02/09/2014   Lisinopril Cough 02/28/2011   Simvastatin Diarrhea 02/28/2011   Cephalexin Other (See Comments) and Rash 07/06/2022    Past Medical History:  Diagnosis Date   Allergic rhinitis    Anemia    Anxiety disorder    Asthma    Blood transfusion without reported diagnosis    Breast cancer ([place])    bilateral   CAD (coronary artery disease)    a. NSTEMI 8/04 => LHC 07/2003:  mLAD hazy 75% => Taxus DES, EF 30% with ant-apical, apical and inf-apical AK.  b.  Echo 4/05: EF 60%.  c.  ETT/Lexiscan Myoview 06/2011: EF 80%, no ischemia, normal wall motion   Cataract    Diverticulosis 11/13/2004   GERD (gastroesophageal reflux disease)    Hyperplastic colon polyp 10/16/1999   Hypertension    IBS (irritable bowel syndrome)    Internal and external hemorrhoids without complication 35/46/5681   MI (myocardial infarction) ([facility]) 12/03/2002   Anteroapical MI with PCI to the LAD   Positive PPD    PVC (premature ventricular contraction)     Past Surgical History:  Procedure Laterality Date   APPENDECTOMY     bilateral  mastectomy     BIOPSY  06/21/2021   Procedure: BIOPSY;  Surgeon: Yetta Flock, MD;  Location: [facility];  Service: Gastroenterology;;   CARDIOVASCULAR STRESS TEST  07/01/2006   ef 80%   CORONARY ANGIOPLASTY WITH STENT PLACEMENT  12/03/2002   LAD   ESOPHAGOGASTRODUODENOSCOPY N/A 06/21/2021   Procedure: ESOPHAGOGASTRODUODENOSCOPY (EGD);  Surgeon: Yetta Flock, MD;  Location: [facility];  Service: Gastroenterology;  Laterality: N/A;   KNEE ARTHROPLASTY  07/2022   NASAL SINUS SURGERY Left 09/25/2021   Procedure: LEFT ENDOSCOPIC MAXILLARY ANTROSTOMY;  Surgeon: [name], MD;  Location: [facility];  Service: ENT;  Laterality: Left;   US ECHOCARDIOGRAPHY  03/27/2004   resting ef 60%    Family History  Problem Relation Age of Onset   Breast cancer Mother    Breast cancer Sister    Heart disease Maternal Grandmother    Asthma Daughter    Spina bifida Other    Colon cancer Neg Hx    Esophageal cancer Neg Hx    Rectal cancer Neg Hx    Stomach cancer Neg Hx     Social History   Socioeconomic History   Marital status: Divorced    Spouse name: Not on file   Number of children: Not on file   Years of education: Not on file   Highest education level: Not on file  Occupational History   Occupation: retired  Tobacco Use   Smoking status: Former    Types: Cigarettes    Quit date: 08/07/1966    Years since quitting: 56.2   Smokeless tobacco: Never  Vaping Use   Vaping Use: Never used  Substance and Sexual Activity   Alcohol use: Not Currently   Drug use: No   Sexual activity: Not Currently    Birth control/protection: Post-menopausal  Other Topics Concern   Not on file  Social History Narrative   Not on file   Social Determinants of Health   Financial Resource Strain: Not on file  Food Insecurity: Not on file  Transportation Needs: Not on file  Physical Activity: Not on file  Stress: Not on file  Social Connections: Not on file   Intimate Partner Violence: Not on file    Review of Systems  Respiratory:  Positive for cough, shortness of breath and wheezing.     Vitals:   11/06/22 1114  BP: 122/80  Pulse: (!) 56  Temp: 97.7 F (36.5 C)  SpO2: 97%     Physical Exam Constitutional:      Appearance: Normal appearance.  HENT:     Head: Normocephalic.     Mouth/Throat:     Mouth: Mucous membranes are moist.  Cardiovascular:     Rate and Rhythm: Normal rate and regular rhythm.  Heart sounds: No murmur heard.    No friction rub.  Pulmonary:     Effort: No respiratory distress.     Breath sounds: No stridor. No wheezing or rhonchi.  Musculoskeletal:     Cervical back: No rigidity or tenderness.  Neurological:     Mental Status: She is alert.  Psychiatric:        Mood and Affect: Mood normal.     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

## 2022-11-06 NOTE — Patient Instructions (Addendum)
Continue with increased dose of Flovent  If symptoms do not continue to improve, we should consider a course of oral steroids -You can send [place] a message to request for some if needed  Avoid triggers as we discussed  Your CT scan as we compared in the office today is stable from previous, the hernia was described on the previous CT scan as well-CT from about 16 months ago Unless you are having acute symptoms which may be pain or discomfort, I do not believe anything needs done at present  I will see you in about 6 months  Call with significant concerns

## 2022-11-12 DIAGNOSIS — H26493 Other secondary cataract, bilateral: Secondary | ICD-10-CM | POA: Diagnosis not present

## 2022-11-12 DIAGNOSIS — H52203 Unspecified astigmatism, bilateral: Secondary | ICD-10-CM | POA: Diagnosis not present

## 2022-11-12 DIAGNOSIS — H524 Presbyopia: Secondary | ICD-10-CM | POA: Diagnosis not present

## 2022-11-12 DIAGNOSIS — H1789 Other corneal scars and opacities: Secondary | ICD-10-CM | POA: Diagnosis not present

## 2022-11-22 DIAGNOSIS — E278 Other specified disorders of adrenal gland: Secondary | ICD-10-CM | POA: Diagnosis not present

## 2022-11-22 DIAGNOSIS — I10 Essential (primary) hypertension: Secondary | ICD-10-CM | POA: Diagnosis not present

## 2022-12-12 DIAGNOSIS — M1712 Unilateral primary osteoarthritis, left knee: Secondary | ICD-10-CM | POA: Diagnosis not present

## 2022-12-12 DIAGNOSIS — Z4789 Encounter for other orthopedic aftercare: Secondary | ICD-10-CM | POA: Diagnosis not present

## 2022-12-14 ENCOUNTER — Telehealth: Payer: Self-pay | Admitting: *Deleted

## 2022-12-14 ENCOUNTER — Encounter: Payer: Self-pay | Admitting: Cardiovascular Disease

## 2022-12-14 NOTE — Telephone Encounter (Signed)
Called and spoke with patient. States BP now is 168/60. States BP has been running in the 130's. She awoke this morning around 0915 with "my head spinning" and states she felt like her vision was different in that she couldn't see clearly. 10am she checked her BP185/64 (56), repeat 175/57 (64) '@3'$ :15, last check 168/60 (74)'@4'$ :15. Verified she has taken Irbesartan '150mg'$ , Bystolic 2.'5mg'$ , and HCTZ '25mg'$  today. She condones an excessive amount of stress currently and states "the situation isn't going to change for a couple of months."  Per Nahser, take an additional tablet of irbesartan and bystolic daily until she sees decrease in BP and have her come in to be seen at next available appt. Pt made aware and will monitor HR. Scheduled for 12/26/22.

## 2022-12-14 NOTE — Telephone Encounter (Signed)
Pt agreeable to plan of care for tele pre op appt 12/20/22 @ 3 pm. Med rec and consent are done.    Patient Consent for Virtual Visit        [name] [name] has provided verbal consent on 12/14/2022 for a virtual visit (video or telephone).   CONSENT FOR VIRTUAL VISIT FOR:  [name] [name]  By participating in this virtual visit I agree to the following:  I hereby voluntarily request, consent and authorize [facility] and its employed or contracted physicians, physician assistants, nurse practitioners or other licensed health care professionals (the Practitioner), to provide me with telemedicine health care services (the "Services") as deemed necessary by the treating Practitioner. I acknowledge and consent to receive the Services by the Practitioner via telemedicine. I understand that the telemedicine visit will involve communicating with the Practitioner through live audiovisual communication technology and the disclosure of certain medical information by electronic transmission. I acknowledge that I have been given the opportunity to request an in-person assessment or other available alternative prior to the telemedicine visit and am voluntarily participating in the telemedicine visit.  I understand that I have the right to withhold or withdraw my consent to the use of telemedicine in the course of my care at any time, without affecting my right to future care or treatment, and that the Practitioner or I may terminate the telemedicine visit at any time. I understand that I have the right to inspect all information obtained and/or recorded in the course of the telemedicine visit and may receive copies of available information for a reasonable fee.  I understand that some of the potential risks of receiving the Services via telemedicine include:  Delay or interruption in medical evaluation due to technological equipment failure or disruption; Information transmitted may not be sufficient  (e.g. poor resolution of images) to allow for appropriate medical decision making by the Practitioner; and/or  In rare instances, security protocols could fail, causing a breach of personal health information.  Furthermore, I acknowledge that it is my responsibility to provide information about my medical history, conditions and care that is complete and accurate to the best of my ability. I acknowledge that Practitioner's advice, recommendations, and/or decision may be based on factors not within their control, such as incomplete or inaccurate data provided by me or distortions of diagnostic images or specimens that may result from electronic transmissions. I understand that the practice of medicine is not an exact science and that Practitioner makes no warranties or guarantees regarding treatment outcomes. I acknowledge that a copy of this consent can be made available to me via my patient portal ([facility]), or I can request a printed copy by calling the office of [facility].    I understand that my insurance will be billed for this visit.   I have read or had this consent read to me. I understand the contents of this consent, which adequately explains the benefits and risks of the Services being provided via telemedicine.  I have been provided ample opportunity to ask questions regarding this consent and the Services and have had my questions answered to my satisfaction. I give my informed consent for the services to be provided through the use of telemedicine in my medical care

## 2022-12-14 NOTE — Telephone Encounter (Signed)
   Pre-operative Risk Assessment    Patient Name: [name] [name]  DOB: 05/22/1946 MRN: [id number]      Request for Surgical Clearance    Procedure:   LEFT TOTAL KNEE ARTHROPLASTY  Date of Surgery:  Clearance TBD                                 Surgeon:  DR. Hart Robinsons Surgeon's Group or Practice Name:  [facility] Phone number:  [phone] ATTN: [name]  Fax number:  [phone]   Type of Clearance Requested:   - Medical  - Pharmacy:  Hold Clopidogrel (Plavix)     Type of Anesthesia:  Spinal   Additional requests/questions:    Jiles Prows   12/14/2022, 1:00 PM

## 2022-12-14 NOTE — Telephone Encounter (Signed)
Pt agreeable to plan of care for tele pre op appt 12/20/22 @ 3 pm. Med rec and consent are done.      Patient Consent for Virtual Visit        [name] [name] has provided verbal consent on 12/14/2022 for a virtual visit (video or telephone).   CONSENT FOR VIRTUAL VISIT FOR:  [name] [name]  By participating in this virtual visit I agree to the following:  I hereby voluntarily request, consent and authorize [facility] and its employed or contracted physicians, physician assistants, nurse practitioners or other licensed health care professionals (the Practitioner), to provide me with telemedicine health care services (the "Services") as deemed necessary by the treating Practitioner. I acknowledge and consent to receive the Services by the Practitioner via telemedicine. I understand that the telemedicine visit will involve communicating with the Practitioner through live audiovisual communication technology and the disclosure of certain medical information by electronic transmission. I acknowledge that I have been given the opportunity to request an in-person assessment or other available alternative prior to the telemedicine visit and am voluntarily participating in the telemedicine visit.  I understand that I have the right to withhold or withdraw my consent to the use of telemedicine in the course of my care at any time, without affecting my right to future care or treatment, and that the Practitioner or I may terminate the telemedicine visit at any time. I understand that I have the right to inspect all information obtained and/or recorded in the course of the telemedicine visit and may receive copies of available information for a reasonable fee.  I understand that some of the potential risks of receiving the Services via telemedicine include:  Delay or interruption in medical evaluation due to technological equipment failure or disruption; Information transmitted may not be  sufficient (e.g. poor resolution of images) to allow for appropriate medical decision making by the Practitioner; and/or  In rare instances, security protocols could fail, causing a breach of personal health information.  Furthermore, I acknowledge that it is my responsibility to provide information about my medical history, conditions and care that is complete and accurate to the best of my ability. I acknowledge that Practitioner's advice, recommendations, and/or decision may be based on factors not within their control, such as incomplete or inaccurate data provided by me or distortions of diagnostic images or specimens that may result from electronic transmissions. I understand that the practice of medicine is not an exact science and that Practitioner makes no warranties or guarantees regarding treatment outcomes. I acknowledge that a copy of this consent can be made available to me via my patient portal ([place]), or I can request a printed copy by calling the office of [facility].    I understand that my insurance will be billed for this visit.   I have read or had this consent read to me. I understand the contents of this consent, which adequately explains the benefits and risks of the Services being provided via telemedicine.  I have been provided ample opportunity to ask questions regarding this consent and the Services and have had my questions answered to my satisfaction. I give my informed consent for the services to be provided through the use of telemedicine in my medical care

## 2022-12-14 NOTE — Telephone Encounter (Signed)
Primary Cardiologist:Philip Nahser, MD   Preoperative team, please contact this patient and set up a phone call appointment for further preoperative risk assessment. Please obtain consent and complete medication review. Thank you for your help.   Pending no symptoms of ACS, per office protocol, she may hold Plavix for 5 days prior to procedure and should resume as soon as hemodynamically stable postoperatively.   Emmaline Life, NP-C  12/14/2022, 1:29 PM [address] Office [phone] Fax [phone]

## 2022-12-20 ENCOUNTER — Ambulatory Visit: Payer: Medicare Other | Attending: Cardiology | Admitting: General Practice

## 2022-12-20 DIAGNOSIS — Z01810 Encounter for preprocedural cardiovascular examination: Secondary | ICD-10-CM

## 2022-12-20 NOTE — Progress Notes (Signed)
Virtual Visit via Telephone Note   Because of [name] [name]'s co-morbid illnesses, she is at least at moderate risk for complications without adequate follow up.  This format is felt to be most appropriate for this patient at this time.  The patient did not have access to video technology/had technical difficulties with video requiring transitioning to audio format only (telephone).  All issues noted in this document were discussed and addressed.  No physical exam could be performed with this format.  Please refer to the patient's chart for her consent to telehealth for [facility].  Evaluation Performed:  Preoperative cardiovascular risk assessment _____________   Date:  12/20/2022   Patient ID:  [name] [name], DOB 12-Jul-1946, MRN [id number] Patient Location:  Home Provider location:   Office  Primary Care Provider:  Cari Caraway, MD Primary Cardiologist:  Mertie Moores, MD  Chief Complaint / Patient Profile   77 y.o. y/o female with a h/o NSTEMI, ischemic cardiomyopathy, HTN, hyperlipidemia who is pending left total knee arthroplasty and presents today for telephonic preoperative cardiovascular risk assessment.  History of Present Illness    [name] [name] is a 77 y.o. female who presents via [employer], [employer] (consulting) for a telehealth visit today.  Pt was last seen in cardiology clinic on 09/21/2022 by Richardson Dopp, PA-C.  At that time [name] [name] was doing well .  The patient is now pending procedure as outlined above. Since her last visit, she remained stable from a cardiac standpoint.  Today she denies chest pain, shortness of breath, lower extremity edema, fatigue, palpitations, melena, hematuria, hemoptysis, diaphoresis, weakness, presyncope, syncope, orthopnea, and PND.   Past Medical History    Past Medical History:  Diagnosis Date   Allergic rhinitis    Anemia    Anxiety disorder    Asthma    Blood transfusion without reported diagnosis     Breast cancer ([facility])    bilateral   CAD (coronary artery disease)    a. NSTEMI 8/04 => LHC 07/2003:  mLAD hazy 75% => Taxus DES, EF 30% with ant-apical, apical and inf-apical AK.  b.  Echo 4/05: EF 60%.  c.  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   Positive PPD    PVC (premature ventricular contraction)    Past Surgical History:  Procedure Laterality Date   APPENDECTOMY     bilateral mastectomy     BIOPSY  06/21/2021   Procedure: BIOPSY;  Surgeon: Yetta Flock, MD;  [facility];  Service: Gastroenterology;;   CARDIOVASCULAR STRESS TEST  07/01/2006   ef 80%   CORONARY ANGIOPLASTY WITH STENT PLACEMENT  12/03/2002   LAD   ESOPHAGOGASTRODUODENOSCOPY N/A 06/21/2021   Procedure: ESOPHAGOGASTRODUODENOSCOPY (EGD);  Surgeon: Yetta Flock, MD;  Location: [facility];  Service: Gastroenterology;  Laterality: N/A;   KNEE ARTHROPLASTY  07/2022   NASAL SINUS SURGERY Left 09/25/2021   Procedure: LEFT ENDOSCOPIC MAXILLARY ANTROSTOMY;  Surgeon: [name], MD;  Location: [facility];  Service: ENT;  Laterality: Left;   US ECHOCARDIOGRAPHY  03/27/2004   resting ef 60%    Allergies  Allergies  Allergen Reactions  Clindamycin Diarrhea and Nausea And Vomiting    Blood in diarrhea   Asa [Aspirin] Other (See Comments)    GI bleed   Codeine Other (See Comments)    headache   Lisinopril Cough   Simvastatin Diarrhea   Cephalexin Other (See Comments) and Rash    Home Medications    Prior to Admission medications   Medication Sig Start Date End Date Taking? Authorizing Provider  acetaminophen (TYLENOL)  325 MG tablet Take 650 mg by mouth every 6 (six) hours as needed for mild pain.     [provider]  albuterol (VENTOLIN HFA) 108 (90 Base) MCG/ACT inhaler Inhale 2 puffs into the lungs every 4 (four) hours as needed for shortness of breath.    [provider]  clopidogrel (PLAVIX) 75 MG tablet Take 1 tablet (75 mg total) by mouth daily. 09/05/21   Nahser, Wonda Cheng, MD  denosumab (PROLIA) 60 MG/ML SOSY injection Inject 60 mg into the skin every 6 (six) months.    [provider]  escitalopram (LEXAPRO) 10 MG tablet Take 10 mg by mouth daily.    [provider]  FLOVENT HFA 220 MCG/ACT inhaler Inhale 2 puffs into the lungs 2 (two) times daily. 11/06/22   Olalere, Ernesto Rutherford, MD  fluticasone (FLONASE) 50 MCG/ACT nasal spray Place 1 spray into both nostrils 2 (two) times daily. 01/08/14   [provider]  hydrochlorothiazide (HYDRODIURIL) 25 MG tablet Take 1 tablet (25 mg total) by mouth daily. 02/13/16   Nahser, Wonda Cheng, MD  irbesartan (AVAPRO) 150 MG tablet Take 150 mg by mouth daily. 08/07/17   [provider]  ketotifen (ZADITOR) 0.025 % ophthalmic solution Place 1 drop into both eyes 2 (two) times daily as needed (allergies).    [provider]  nebivolol (BYSTOLIC) 2.5 MG tablet Take 1 tablet (2.5 mg total) by mouth daily. 09/05/20   Nahser, Wonda Cheng, MD  pantoprazole (PROTONIX) 40 MG tablet Take 1 tablet (40 mg total) by mouth daily. 01/26/22   Mauri Pole, MD  polyvinyl alcohol (LIQUIFILM TEARS) 1.4 % ophthalmic solution Place 1 drop into both eyes as needed for dry eyes.    [provider]  potassium chloride SA (KLOR-CON M) 20 MEQ tablet Take 1.5 tablets (30 mEq total) by mouth 2 (two) times daily. 09/24/22   Richardson Dopp T, PA-C  PREMARIN vaginal cream Place 0.5 Applicatorfuls vaginally 2 (two) times a week. On Tuesday and Saturday     Use as directed 05/03/11   [provider]  rosuvastatin (CRESTOR) 5 MG tablet  Take 1 tablet (5 mg total) by mouth daily. 01/13/13   Nahser, Wonda Cheng, MD    Physical Exam    Vital Signs:  Quentin Ore does not have vital signs available for review today.  Given telephonic nature of communication, physical exam is limited. AAOx3. NAD. Normal affect.  Speech and respirations are unlabored.  Accessory Clinical Findings    None  Assessment & Plan    1.  Preoperative Cardiovascular Risk Assessment: Left total knee arthroplasty, Dr. Hart Robinsons, [facility]      Primary Cardiologist: Mertie Moores, MD  Chart reviewed as part of pre-operative protocol coverage. Given past medical history and time since last visit, based on ACC/AHA guidelines, [name] [name] would be at acceptable risk for the planned procedure without further cardiovascular testing.   Patient was advised that if she develops new symptoms prior to surgery to contact our office to arrange a  follow-up appointment.  She verbalized understanding.  Her RCRI is a class III risk, 6.6% risk of major cardiac event.  She is able to complete greater than 4 METS of physical activity.  Her Plavix may be held for 5 days prior to her procedure.  Please resume as soon as hemostasis is achieved.  I will route this recommendation to the requesting party via Epic fax function and remove from pre-op pool.    Time:   Today, I have spent 7 minutes with the patient with telehealth technology discussing medical history, symptoms, and management plan.  Prior to her phone evaluation I spent greater than 10 minutes reviewing her past medical history and cardiac medications.   Deberah Pelton, NP  12/20/2022, 8:41 AM

## 2022-12-25 ENCOUNTER — Telehealth: Payer: Self-pay | Admitting: Pulmonary Disease

## 2022-12-25 ENCOUNTER — Encounter: Payer: Self-pay | Admitting: Cardiovascular Disease

## 2022-12-25 NOTE — Telephone Encounter (Signed)
PT calling saying she can not longer get Flovent and would like an alternative RX called in.  PharmDereck Ligas  [phone]

## 2022-12-25 NOTE — Progress Notes (Unsigned)
Cardiology Office Note   Date:  12/26/2022   ID:  [name] [name], DOB 1946/07/15, MRN [id number]  PCP:  Cari Caraway, MD  Cardiologist:   Mertie Moores, MD   Chief Complaint  Patient presents with   Coronary Artery Disease        Problem list: 1.  Coronary artery disease-status post PTCA and stenting of her left anterior descending artery (August, 2004),  3.0 x 20 mm Taxus stent inflated up to 12 atmospheres.  2. Hyperlipidemia 3. Breast Cancer 4. Asthma    Feb. 11, 2016  [name] [name] is a 77 y.o. female who presents for follow up of her CAD. She has seen Cecille Rubin for the past several visits.  She has been seen in the ER for dyspnea.   Echo in July, 2015 showed normal LV function with trivial valvular abnormalities. She was started on HCTZ - has had some orthostasis   She has been feeling much better recently . Thinks her potassium was too low. No chest pain . Has some palpitations.    Feb. 10, 2017:  [name] [name] is doing great  Doing great.   BP has been ok. Is elevated here.   Exercises in the summer. Not as much in the winter   No CP or dyspnea.   Aug. 18, 2017:  Doing great. Had shingles ,  Doing better now.   Slow to heal .  Sept. 11, 2018:  [name] [name] is doing well.  Doing better on the Bystolic - was having lots of palpitations . No angina   Sept. 12, 2019:  Slowing down some, Still gardening  -  Flowers , She brought lab work from [employer].  Her vitamin D level is 56.9.  Total cholesterol is 135.  Triglyceride level is 53.  LDL is 65.  HDL is 60. Electrolyte panel looks good.  Glucose is 90.  Creatinine is 0.8.  Sodium is 141.  Potassium is 3.9.  Oct. 2 2020   [name] [name] is seen for follow   She brought labs from her primary medical doctor's office from February 10, 2019.  Her total cholesterol is 120 HDL is 52 Triglyceride level is 57 LDL is 57. Glucose level is 86.  Creatinine 0.82.  Sodium and potassium are within normal limits.  Liver enzymes are normal.   Vitamin D level was 70.  Oct. 4, 2021: [name] [name] is seen today for follow up of her CAD and HLD   troubles with asthma No cp, dyspnea, presyncope  Labs from Dr. Addison Lank were reviewed.  Cholesterol levels equals 122 HDL is 44 Triglyceride level is 66 LDL is 65.  Basic metabolic profile is relatively stable.  Sodium is 140.  Potassium is 3.7.  Creatinine is 1.0.  Oct. 4, 2022:  And is seen today for follow-up of her coronary artery disease and hypertension. Developed a GI bleed, Was found to have 2 GI ulcers.  She is still on ASA  We will DC ASA and start plavix 75 mg a day   She needs to have surgery on a nasal polyp.   She is at low risk for this upcoming surgery.  She will need to hold her Plavix for 5 to 7 days prior to her surgery.  My recommendation would be to restart the Plavix as soon as it is deemed safe from a surgical standpoint following her surgery.  No CP  Grows her flower garden yearly . Lipids from March , 2022 look good   Oct. 17, 2023  No show, patient cancelled  Jan. 24, 2024   [name] [name] is seen for follow up of her CAD, HTN We stopped her ASA last year, plavix has been continued  BP remains elevated  BP has been in the 150s for the past several months  Is avoiding salt  No leg edema   Blood pressure has been elevated at home.  I have noticed that her potassium levels are frequently low despite taking high doses of potassium supplement.  We will discontinue the HCTZ and start her on spironolactone 25 mg a day.  Will check a basic metabolic profile in 1 week.  Past Medical History:  Diagnosis Date   Allergic rhinitis    Anemia    Anxiety disorder    Asthma    Blood transfusion without reported diagnosis    Breast cancer ([place])    bilateral   CAD (coronary artery disease)    a. NSTEMI 8/04 => LHC 07/2003:  mLAD hazy 75% => Taxus DES, EF 30% with ant-apical, apical and inf-apical AK.  b.  Echo 4/05: EF 60%.  c.  ETT/Lexiscan Myoview 06/2011: EF 80%, no ischemia,  normal wall motion   Cataract    Diverticulosis 11/13/2004   GERD (gastroesophageal reflux disease)    Hyperplastic colon polyp 10/16/1999   Hypertension    IBS (irritable bowel syndrome)    Internal and external hemorrhoids without complication 67/54/4920   MI (myocardial infarction) ([place]) 12/03/2002   Anteroapical MI with PCI to the LAD   Positive PPD    PVC (premature ventricular contraction)     Past Surgical History:  Procedure Laterality Date   APPENDECTOMY     bilateral mastectomy     BIOPSY  06/21/2021   Procedure: BIOPSY;  Surgeon: Yetta Flock, MD;  Location: [facility];  Service: Gastroenterology;;   CARDIOVASCULAR STRESS TEST  07/01/2006   ef 80%   CORONARY ANGIOPLASTY WITH STENT PLACEMENT  12/03/2002   LAD   ESOPHAGOGASTRODUODENOSCOPY N/A 06/21/2021   Procedure: ESOPHAGOGASTRODUODENOSCOPY (EGD);  Surgeon: Yetta Flock, MD;  Location: [facility];  Service: Gastroenterology;  Laterality: N/A;   KNEE ARTHROPLASTY  07/2022   NASAL SINUS SURGERY Left 09/25/2021   Procedure: LEFT ENDOSCOPIC MAXILLARY ANTROSTOMY;  Surgeon: [name], MD;  Location: [facility];  Service: ENT;  Laterality: Left;   US ECHOCARDIOGRAPHY  03/27/2004   resting ef 60%     Current Outpatient Medications  Medication Sig Dispense Refill   acetaminophen (TYLENOL) 325 MG tablet Take 650 mg by mouth every 6 (six) hours as needed for mild pain.      albuterol (VENTOLIN HFA) 108 (90 Base) MCG/ACT inhaler Inhale 2 puffs into the lungs every 4 (four) hours as needed for shortness of breath.     clopidogrel (PLAVIX) 75 MG tablet Take 1 tablet (75 mg total) by mouth daily. 90 tablet 3   denosumab (PROLIA) 60 MG/ML SOSY injection Inject 60 mg into the skin every 6 (six) months.     escitalopram (LEXAPRO) 10 MG tablet Take 15 mg by mouth daily.     FLOVENT HFA 220 MCG/ACT inhaler Inhale 2 puffs into the lungs 2 (two) times daily. 1 each 6   fluticasone (FLONASE) 50  MCG/ACT nasal spray Place 1 spray into both nostrils 2 (two) times daily.     irbesartan (AVAPRO) 150 MG tablet Take 300 mg by mouth daily.     ketotifen (ZADITOR) 0.025 % ophthalmic solution Place 1 drop into both eyes 2 (two) times daily  as needed (allergies).     nebivolol (BYSTOLIC) 2.5 MG tablet Take 1 tablet (2.5 mg total) by mouth daily. (Patient taking differently: Take 5 mg by mouth daily.) 30 tablet 11   pantoprazole (PROTONIX) 40 MG tablet Take 1 tablet (40 mg total) by mouth daily. 90 tablet 3   polyvinyl alcohol (LIQUIFILM TEARS) 1.4 % ophthalmic solution Place 1 drop into both eyes as needed for dry eyes.     potassium chloride SA (KLOR-CON M20) 20 MEQ tablet Take 1 tablet (20 mEq total) by mouth daily. 90 tablet 3   PREMARIN vaginal cream Place 0.5 Applicatorfuls vaginally 2 (two) times a week. On Tuesday and Saturday     Use as directed     rosuvastatin (CRESTOR) 5 MG tablet Take 1 tablet (5 mg total) by mouth daily. 30 tablet 6   spironolactone (ALDACTONE) 25 MG tablet Take 1 tablet (25 mg total) by mouth daily. 90 tablet 3   budesonide (PULMICORT FLEXHALER) 180 MCG/ACT inhaler Inhale 1 puff into the lungs in the morning and at bedtime. Rinse mouth well after use. 1 each 3   Current Facility-Administered Medications  Medication Dose Route Frequency Provider Last Rate Last Admin   0.9 %  sodium chloride infusion  500 mL Intravenous Once Nandigam, Venia Minks, MD        Allergies:   Clindamycin, Asa [aspirin], Codeine, Lisinopril, Simvastatin, and Cephalexin    Social History:  The patient  reports that she quit smoking about 56 years ago. Her smoking use included cigarettes. She has never used smokeless tobacco. She reports that she does not currently use alcohol. She reports that she does not use drugs.   Family History:  The patient's family history includes Asthma in her daughter; Breast cancer in her mother and sister; Heart disease in her maternal grandmother; Spina bifida in  an other family member.    ROS:  Please see the history of present illness.   Physical Exam: Blood pressure (!) 140/80, pulse (!) 56, height '5\' 1"'$  (1.549 m), weight 139 lb 9.6 oz (63.3 kg), SpO2 97 %.  HYPERTENSION CONTROL Vitals:   12/26/22 1139 12/26/22 1157  BP: (!) 152/82 (!) 140/80    The patient's blood pressure is elevated above target today.  In order to address the patient's elevated BP: A new medication was prescribed today.       GEN:  Well nourished, well developed in no acute distress HEENT: Normal NECK: No JVD; No carotid bruits LYMPHATICS: No lymphadenopathy CARDIAC: RRR , no murmurs, rubs, gallops RESPIRATORY:  Clear to auscultation without rales, wheezing or rhonchi  ABDOMEN: Soft, non-tender, non-distended MUSCULOSKELETAL:  No edema; No deformity  SKIN: Warm and dry NEUROLOGIC:  Alert and oriented x 3    EKG:       Recent Labs: 10/19/2022: ALT 11; BUN 17; Creatinine, Ser 0.92; Hemoglobin 13.4; Platelets 310.0; Potassium 3.6; Sodium 140    Lipid Panel    Component Value Date/Time   CHOL 108 02/11/2013 0850   TRIG 41.0 02/11/2013 0850   HDL 53.40 02/11/2013 0850   CHOLHDL 2 02/11/2013 0850   VLDL 8.2 02/11/2013 0850   LDLCALC 46 02/11/2013 0850      Wt Readings from Last 3 Encounters:  12/26/22 139 lb 9.6 oz (63.3 kg)  11/06/22 136 lb 12.8 oz (62.1 kg)  10/19/22 137 lb (62.1 kg)    Other studies Reviewed: Additional studies/ records that were reviewed today include: labs from Dr. Addison Lank. Review of the above records  demonstrates: normal lipids   ASSESSMENT AND PLAN:  1.  Coronary artery disease -    She denies any episodes of angina.    2. Hyperlipidemia -    stable.   3. Palpitations: -     4. Asthma -     5. Hypertension:    Blood pressure has been elevated.  I see that her potassium levels have been persistently low.  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  Will discontinue the HCTZ.  Start spironolactone 25 mg a  day.  Reduce her potassium chloride to 20 mEq a day.  Will check a basic metabolic profile today and again in 1 week.  6.  Nasal polyp:    Current medicines are reviewed at length with the patient today.  The patient does not have concerns regarding medicines.  The following changes have been made:  no change  Disposition:     Signed, Mertie Moores, MD  12/26/2022 1:52 PM    [facility] [address], [place], [place]  [address] Phone: [phone]; Fax: [phone]

## 2022-12-26 ENCOUNTER — Encounter: Payer: Self-pay | Admitting: Cardiovascular Disease

## 2022-12-26 ENCOUNTER — Ambulatory Visit: Payer: Medicare Other | Attending: Cardiovascular Disease | Admitting: Cardiovascular Disease

## 2022-12-26 ENCOUNTER — Other Ambulatory Visit: Payer: Self-pay

## 2022-12-26 VITALS — BP 140/80 | HR 56 | Ht 61.0 in | Wt 139.6 lb

## 2022-12-26 DIAGNOSIS — I2510 Atherosclerotic heart disease of native coronary artery without angina pectoris: Secondary | ICD-10-CM | POA: Diagnosis not present

## 2022-12-26 DIAGNOSIS — E876 Hypokalemia: Secondary | ICD-10-CM

## 2022-12-26 DIAGNOSIS — I10 Essential (primary) hypertension: Secondary | ICD-10-CM

## 2022-12-26 MED ORDER — SPIRONOLACTONE 25 MG PO TABS: 25.0000 mg | ORAL_TABLET | Freq: Every day | ORAL | 3 refills | Status: DC

## 2022-12-26 MED ORDER — POTASSIUM CHLORIDE CRYS ER 20 MEQ PO TBCR: 20.0000 meq | EXTENDED_RELEASE_TABLET | Freq: Every day | ORAL | 3 refills | Status: DC

## 2022-12-26 MED ORDER — PULMICORT FLEXHALER 180 MCG/ACT IN AEPB: 1.0000 | INHALATION_SPRAY | Freq: Two times a day (BID) | RESPIRATORY_TRACT | 3 refills | Status: DC

## 2022-12-26 NOTE — Patient Instructions (Signed)
Medication Instructions:  STOP Hydrochlorothiazide START Spironolactone '25mg'$  daily DECREASE Potassium Chloride to 72mq daily *If you need a refill on your cardiac medications before your next appointment, please call your pharmacy*   Lab Work: BMET today Repeat BMET in 1 week If you have labs (blood work) drawn today and your tests are completely normal, you will receive your results only by: MBorger(if you have MyChart) OR A paper copy in the mail If you have any lab test that is abnormal or we need to change your treatment, we will call you to review the results.   Testing/Procedures: NONE   Follow-Up: At [facility] you and your health needs are our priority.  As part of our continuing mission to provide you with exceptional heart care, we have created designated Provider Care Teams.  These Care Teams include your primary Cardiologist (physician) and Advanced Practice Providers (APPs -  Physician Assistants and Nurse Practitioners) who all work together to provide you with the care you need, when you need it.  We recommend signing up for the patient portal called "MyChart".  Sign up information is provided on this After Visit Summary.  MyChart is used to connect with patients for Virtual Visits (Telemedicine).  Patients are able to view lab/test results, encounter notes, upcoming appointments, etc.  Non-urgent messages can be sent to your provider as well.   To learn more about what you can do with MyChart, go to [email or web address]    Your next appointment:   4 week(s)  Provider:   PMertie Moores MD

## 2022-12-26 NOTE — Telephone Encounter (Signed)
Spoke to patient and her pharmacy. Flovent 220 has been discontinued they only carry the generic. I also called around to other pharamcies and was told the same thing. Please advise if you want prescription changed to generic or patient states she has previously been on Pulmicort and is fine going back to that if it is a possibility  [place] can you please advise since Dr. Ander Slade is unavailable

## 2022-12-26 NOTE — Telephone Encounter (Signed)
Spoke with patient. Patient wanted to go back on Pulmicort. Medication has been sent. Nothing further needed.

## 2022-12-27 LAB — BASIC METABOLIC PANEL
BUN/Creatinine Ratio: 17 (ref 12–28)
BUN: 17 mg/dL (ref 8–27)
CO2: 27 mmol/L (ref 20–29)
Calcium: 9 mg/dL (ref 8.7–10.3)
Chloride: 104 mmol/L (ref 96–106)
Creatinine, Ser: 0.98 mg/dL (ref 0.57–1.00)
Glucose: 91 mg/dL (ref 70–99)
Potassium: 4 mmol/L (ref 3.5–5.2)
Sodium: 139 mmol/L (ref 134–144)
eGFR: 59 mL/min/{1.73_m2} — ABNORMAL LOW (ref >59)

## 2022-12-29 ENCOUNTER — Encounter: Payer: Self-pay | Admitting: Cardiovascular Disease

## 2022-12-29 DIAGNOSIS — Z79899 Other long term (current) drug therapy: Secondary | ICD-10-CM

## 2023-01-01 ENCOUNTER — Encounter: Payer: Self-pay | Admitting: Pulmonary Disease

## 2023-01-01 NOTE — Telephone Encounter (Signed)
Per MyChart encounter on 12/14/22:  Per Nahser, take an additional tablet of irbesartan and bystolic daily until she sees decrease in BP and have her come in to be seen at next available appt. Pt made aware and will monitor HR. Scheduled for 12/26/22.   Pt seen on 12/26/22 by Nahser:  Blood pressure has been elevated at home. I have noticed that her potassium levels are frequently low despite taking high doses of potassium supplement. We will discontinue the HCTZ and start her on spironolactone 25 mg a day. Will check a basic metabolic profile in 1 week   Returned call to patient to see how her Bp has been since doubling the above medications and starting the Spironolactone '25mg'$ . She states that it's not changed much. Sh provides the following BP log: 1/27 144/71 1/28 145/78 1/29 145/73 These are the only readings she has since visit. States she takes all three BP meds in the morning approx 10am, but checks Bp whenever she remembers it during the day. Advised that in light of the still increased numbers, I'd like to consult with MD before sending refills in. Routing to MD for review.

## 2023-01-02 ENCOUNTER — Ambulatory Visit: Payer: Medicare Other | Attending: Cardiovascular Disease

## 2023-01-02 DIAGNOSIS — I10 Essential (primary) hypertension: Secondary | ICD-10-CM | POA: Diagnosis not present

## 2023-01-02 DIAGNOSIS — I2510 Atherosclerotic heart disease of native coronary artery without angina pectoris: Secondary | ICD-10-CM | POA: Diagnosis not present

## 2023-01-02 DIAGNOSIS — E876 Hypokalemia: Secondary | ICD-10-CM

## 2023-01-02 MED ORDER — NEBIVOLOL HCL 5 MG PO TABS: 5.0000 mg | ORAL_TABLET | Freq: Every day | ORAL | 3 refills | Status: DC

## 2023-01-02 MED ORDER — AMLODIPINE BESYLATE 5 MG PO TABS: 5.0000 mg | ORAL_TABLET | Freq: Every day | ORAL | 3 refills | Status: DC

## 2023-01-02 MED ORDER — IRBESARTAN 300 MG PO TABS: 300.0000 mg | ORAL_TABLET | Freq: Every day | ORAL | 3 refills | Status: DC

## 2023-01-02 NOTE — Telephone Encounter (Signed)
Left detailed message for patient per DPR stating that we will refill the Bystolic at '5mg'$  daily, Irbesartan '300mg'$  daily, and sent in new Rx for Amlodipine 2.'5mg'$  daily. Advised she continue to check BP daily and bring with her to upcoming appt 01/28/23. BMET ordered and scheduled for same day as appt.

## 2023-01-02 NOTE — Telephone Encounter (Signed)
Mychart message sent by pt: [name] [name] "Ann"  P [facility] (supporting Adewale Clearence Ped, MD)16 hours ago (4:48 PM)    You sent in a prescription for brand name only Pulmacort to replace Flovent which is no longer available.     My insurance company has denied it.   Please change the prescription to a generic form of Flovent (FLUTICASONE PROPIONATE ), which I believe the [employer] will approve. I've been without for several days and  am beginning to feel it.     Routing to prior auth team to see if we can get things figured out for pt.

## 2023-01-03 LAB — BASIC METABOLIC PANEL
BUN/Creatinine Ratio: 18 (ref 12–28)
BUN: 16 mg/dL (ref 8–27)
CO2: 24 mmol/L (ref 20–29)
Calcium: 9 mg/dL (ref 8.7–10.3)
Chloride: 103 mmol/L (ref 96–106)
Creatinine, Ser: 0.89 mg/dL (ref 0.57–1.00)
Glucose: 84 mg/dL (ref 70–99)
Potassium: 4.5 mmol/L (ref 3.5–5.2)
Sodium: 141 mmol/L (ref 134–144)
eGFR: 66 mL/min/{1.73_m2} (ref >59)

## 2023-01-08 ENCOUNTER — Telehealth: Payer: Self-pay

## 2023-01-08 ENCOUNTER — Other Ambulatory Visit (HOSPITAL_COMMUNITY): Payer: Self-pay

## 2023-01-08 DIAGNOSIS — M25562 Pain in left knee: Secondary | ICD-10-CM | POA: Diagnosis not present

## 2023-01-08 NOTE — Telephone Encounter (Signed)
Patient Advocate Encounter   Received notification from [employer] that prior authorization is required for Fluticasone Propionate HFA 220MCG/ACT aerosol  Submitted: 01-08-2023 Key [id number]   Status is pending

## 2023-01-08 NOTE — Telephone Encounter (Signed)
PA has been submitted and a new encounter created. Will update there when receive determination

## 2023-01-09 NOTE — Telephone Encounter (Signed)
Patient Advocate Encounter  Received a fax from [employer] regarding Prior Authorization for Fluticasone Propionate HFA 220MCG/ACT aerosol.   Authorization has been DENIED due to

## 2023-01-10 NOTE — Telephone Encounter (Signed)
Dr Jenetta Downer,  Please advise on pharmacy note on denial on PA for Fluticasone Propionate HFA sir  Thank you

## 2023-01-11 ENCOUNTER — Other Ambulatory Visit: Payer: Self-pay | Admitting: Pulmonary Disease

## 2023-01-11 MED ORDER — ARNUITY ELLIPTA 200 MCG/ACT IN AEPB: 1.0000 | INHALATION_SPRAY | Freq: Every day | RESPIRATORY_TRACT | 5 refills | Status: DC

## 2023-01-11 NOTE — Telephone Encounter (Signed)
Mychart message sent to pt in open mychart feed letting her know the response from PA and that Arnuity was sent in place of prior inhaler. Nothing further needed.

## 2023-01-11 NOTE — Telephone Encounter (Signed)
Prescription for Arnuity sent into pharmacy

## 2023-01-27 ENCOUNTER — Encounter: Payer: Self-pay | Admitting: Cardiovascular Disease

## 2023-01-27 ENCOUNTER — Other Ambulatory Visit: Payer: Self-pay | Admitting: Gastroenterology

## 2023-01-27 NOTE — Progress Notes (Unsigned)
Cardiology Office Note   Date:  01/28/2023   ID:  [name] [name], DOB 05-28-1946, MRN [id number]  PCP:  Cari Caraway, MD  Cardiologist:   Mertie Moores, MD   Chief Complaint  Patient presents with   Coronary Artery Disease   Problem list: 1.  Coronary artery disease-status post PTCA and stenting of her left anterior descending artery (August, 2004),  3.0 x 20 mm Taxus stent inflated up to 12 atmospheres.  2. Hyperlipidemia 3. Breast Cancer 4. Asthma    Feb. 11, 2016  [name] [name] is a 77 y.o. female who presents for follow up of her CAD. She has seen Cecille Rubin for the past several visits.  She has been seen in the ER for dyspnea.   Echo in July, 2015 showed normal LV function with trivial valvular abnormalities. She was started on HCTZ - has had some orthostasis   She has been feeling much better recently . Thinks her potassium was too low. No chest pain . Has some palpitations.    Feb. 10, 2017:  [name] [name] is doing great  Doing great.   BP has been ok. Is elevated here.   Exercises in the summer. Not as much in the winter   No CP or dyspnea.   Aug. 18, 2017:  Doing great. Had shingles ,  Doing better now.   Slow to heal .  Sept. 11, 2018:  [name] [name] is doing well.  Doing better on the Bystolic - was having lots of palpitations . No angina   Sept. 12, 2019:  Slowing down some, Still gardening  -  Flow[place] , She brought lab work from [employer].  Her vitamin D level is 56.9.  Total cholesterol is 135.  Triglyceride level is 53.  LDL is 65.  HDL is 60. Electrolyte panel looks good.  Glucose is 90.  Creatinine is 0.8.  Sodium is 141.  Potassium is 3.9.  Oct. 2 2020   [name] [name] is seen for follow   She brought labs from her primary medical doctor's office from February 10, 2019.  Her total cholesterol is 120 HDL is 52 Triglyceride level is 57 LDL is 57. Glucose level is 86.  Creatinine 0.82.  Sodium and potassium are within normal limits.  Liver enzymes are normal.  Vitamin  D level was 70.  Oct. 4, 2021: [name] [name] is seen today for follow up of her CAD and HLD   troubles with asthma No cp, dyspnea, presyncope  Labs from Dr. Addison Lank were reviewed.  Cholesterol levels equals 122 HDL is 44 Triglyceride level is 66 LDL is 65.  Basic metabolic profile is relatively stable.  Sodium is 140.  Potassium is 3.7.  Creatinine is 1.0.  Oct. 4, 2022:  And is seen today for follow-up of her coronary artery disease and hypertension. Developed a GI bleed, Was found to have 2 GI ulcers.  She is still on ASA  We will DC ASA and start plavix 75 mg a day   She needs to have surgery on a nasal polyp.   She is at low risk for this upcoming surgery.  She will need to hold her Plavix for 5 to 7 days prior to her surgery.  My recommendation would be to restart the Plavix as soon as it is deemed safe from a surgical standpoint following her surgery.  No CP  Grows her flower garden yearly . Lipids from March , 2022 look good   Oct. 17, 2023 No show, patient cancelled  Jan. 24, 2024   [name] [name] is seen for follow up of her CAD, HTN We stopped her ASA last year, plavix has been continued  BP remains elevated  BP has been in the 150s for the past several months  Is avoiding salt  No leg edema   Blood pressure has been elevated at home.  I have noticed that her potassium levels are frequently low despite taking high doses of potassium supplement.  We will discontinue the HCTZ and start her on spironolactone 25 mg a day.  Will check a basic metabolic profile in 1 week.   Feb. 26, 2024 [name] [name] is seen today for follow up of her HTN We started spironolactone in January  BP is much better now    Past Medical History:  Diagnosis Date   Allergic rhinitis    Anemia    Anxiety disorder    Asthma    Blood transfusion without reported diagnosis    Breast cancer ([place])    bilateral   CAD (coronary artery disease)    a. NSTEMI 8/04 => LHC 07/2003:  mLAD hazy 75% => Taxus DES, EF 30% with  ant-apical, apical and inf-apical AK.  b.  Echo 4/05: EF 60%.  c.  ETT/Lexiscan Myoview 06/2011: EF 80%, no ischemia, normal wall motion   Cataract    Diverticulosis 11/13/2004   GERD (gastroesophageal reflux disease)    Hyperplastic colon polyp 10/16/1999   Hypertension    IBS (irritable bowel syndrome)    Internal and external hemorrhoids without complication [id number]   MI (myocardial infarction) ([place]) 12/03/2002   Anteroapical MI with PCI to the LAD   Positive PPD    PVC (premature ventricular contraction)     Past Surgical History:  Procedure Laterality Date   APPENDECTOMY     bilateral mastectomy     BIOPSY  06/21/2021   Procedure: BIOPSY;  Surgeon: Yetta Flock, MD;  Location: [facility];  Service: Gastroenterology;;   CARDIOVASCULAR STRESS TEST  07/01/2006   ef 80%   CORONARY ANGIOPLASTY WITH STENT PLACEMENT  12/03/2002   LAD   ESOPHAGOGASTRODUODENOSCOPY N/A 06/21/2021   Procedure: ESOPHAGOGASTRODUODENOSCOPY (EGD);  Surgeon: Yetta Flock, MD;  Location: [facility];  Service: Gastroenterology;  Laterality: N/A;   KNEE ARTHROPLASTY  07/2022   NASAL SINUS SURGERY Left 09/25/2021   Procedure: LEFT ENDOSCOPIC MAXILLARY ANTROSTOMY;  Surgeon: [name], MD;  Location: [facility];  Service: ENT;  Laterality: Left;   US ECHOCARDIOGRAPHY  03/27/2004   resting ef 60%     Current Outpatient Medications  Medication Sig Dispense Refill   acetaminophen (TYLENOL) 325 MG tablet Take 650 mg by mouth every 6 (six) hours as needed for mild pain.      albuterol (VENTOLIN HFA) 108 (90 Base) MCG/ACT inhaler Inhale 2 puffs into the lungs every 4 (four) hours as needed for shortness of breath.     amLODipine (NORVASC) 5 MG tablet Take 1 tablet (5 mg total) by mouth daily. 90 tablet 3   clopidogrel (PLAVIX) 75 MG tablet Take 1 tablet (75 mg total) by mouth daily. 90 tablet 3   denosumab (PROLIA) 60 MG/ML SOSY injection Inject 60 mg into the skin every 6  (six) months.     escitalopram (LEXAPRO) 10 MG tablet Take 15 mg by mouth daily.     fluticasone (FLONASE) 50 MCG/ACT nasal spray Place 1 spray into both nostrils 2 (two) times daily.     Fluticasone Furoate (ARNUITY ELLIPTA) 200 MCG/ACT AEPB Inhale 1  puff into the lungs daily. 30 each 5   irbesartan (AVAPRO) 300 MG tablet Take 1 tablet (300 mg total) by mouth daily. 90 tablet 3   ketotifen (ZADITOR) 0.025 % ophthalmic solution Place 1 drop into both eyes 2 (two) times daily as needed (allergies).     nebivolol (BYSTOLIC) 5 MG tablet Take 1 tablet (5 mg total) by mouth daily. 90 tablet 3   pantoprazole (PROTONIX) 40 MG tablet TAKE ONE TABLET BY MOUTH ONCE DAILY 90 tablet 3   polyvinyl alcohol (LIQUIFILM TEARS) 1.4 % ophthalmic solution Place 1 drop into both eyes as needed for dry eyes.     potassium chloride SA (KLOR-CON M20) 20 MEQ tablet Take 1 tablet (20 mEq total) by mouth daily. 90 tablet 3   PREMARIN vaginal cream Place 0.5 Applicatorfuls vaginally 2 (two) times a week. On Tuesday and Saturday     Use as directed     rosuvastatin (CRESTOR) 5 MG tablet Take 1 tablet (5 mg total) by mouth daily. 30 tablet 6   spironolactone (ALDACTONE) 25 MG tablet Take 1 tablet (25 mg total) by mouth daily. 90 tablet 3   Current Facility-Administered Medications  Medication Dose Route Frequency Provider Last Rate Last Admin   0.9 %  sodium chloride infusion  500 mL Intravenous Once Nandigam, Kavitha V, MD        Allergies:   Clindamycin, Asa [aspirin], Codeine, Lisinopril, Simvastatin, and Cephalexin    Social History:  The patient  reports that she quit smoking about 56 years ago. Her smoking use included cigarettes. She has never used smokeless tobacco. She reports that she does not currently use alcohol. She reports that she does not use drugs.   Family History:  The patient's family history includes Asthma in her daughter; Breast cancer in her mother and sister; Heart disease in her maternal  grandmother; Spina bifida in an other family member.    ROS:  Please see the history of present illness.     Physical Exam: Blood pressure 126/64, pulse 65, height '5\' 1"'$  (1.549 m), weight 139 lb (63 kg), SpO2 97 %.       GEN:  Well nourished, well developed in no acute distress HEENT: Normal NECK: No JVD; No carotid bruits LYMPHATICS: No lymphadenopathy CARDIAC: RRR , no murmurs, rubs, gallops RESPIRATORY:  Clear to auscultation without rales, wheezing or rhonchi  ABDOMEN: Soft, non-tender, non-distended MUSCULOSKELETAL:  No edema; No deformity  SKIN: Warm and dry NEUROLOGIC:  Alert and oriented x 3    EKG:       Recent Labs: 10/19/2022: ALT 11; Hemoglobin 13.4; Platelets 310.0 01/02/2023: BUN 16; Creatinine, Ser 0.89; Potassium 4.5; Sodium 141    Lipid Panel    Component Value Date/Time   CHOL 108 02/11/2013 0850   TRIG 41.0 02/11/2013 0850   HDL 53.40 02/11/2013 0850   CHOLHDL 2 02/11/2013 0850   VLDL 8.2 02/11/2013 0850   LDLCALC 46 02/11/2013 0850      Wt Readings from Last 3 Encounters:  01/28/23 139 lb (63 kg)  12/26/22 139 lb 9.6 oz (63.3 kg)  11/06/22 136 lb 12.8 oz (62.1 kg)    Other studies Reviewed: Additional studies/ records that were reviewed today include: labs from Dr. Addison Lank. Review of the above records demonstrates: normal lipids   ASSESSMENT AND PLAN:  1.  Coronary artery disease -   she is not having any episodes of angina.      2. Hyperlipidemia -  Lipids have been stable.  Continue current medications.  3. Palpitations: -     4. Asthma -     5. Hypertension:     Blood pressure is much better on spironolactone.  Continue current medications. Will have her follow-up with Christen Bame, NP in 6 months   6.  Nasal polyp:    Current medicines are reviewed at length with the patient today.  The patient does not have concerns regarding medicines.  The following changes have been made:  no change  Disposition:      Signed, Mertie Moores, MD  01/28/2023 11:32 AM    [facility] [address], [place], [place]  [address] Phone: [phone]; Fax: [phone]

## 2023-01-28 ENCOUNTER — Ambulatory Visit: Payer: Medicare Other

## 2023-01-28 ENCOUNTER — Ambulatory Visit: Payer: Medicare Other | Attending: Cardiovascular Disease | Admitting: Cardiovascular Disease

## 2023-01-28 ENCOUNTER — Encounter: Payer: Self-pay | Admitting: Cardiovascular Disease

## 2023-01-28 VITALS — BP 126/64 | HR 65 | Ht 61.0 in | Wt 139.0 lb

## 2023-01-28 DIAGNOSIS — M810 Age-related osteoporosis without current pathological fracture: Secondary | ICD-10-CM | POA: Diagnosis not present

## 2023-01-28 DIAGNOSIS — I2510 Atherosclerotic heart disease of native coronary artery without angina pectoris: Secondary | ICD-10-CM

## 2023-01-28 DIAGNOSIS — I10 Essential (primary) hypertension: Secondary | ICD-10-CM

## 2023-01-28 DIAGNOSIS — Z79899 Other long term (current) drug therapy: Secondary | ICD-10-CM | POA: Diagnosis not present

## 2023-01-28 NOTE — Patient Instructions (Signed)
Medication Instructions:   Your physician recommends that you continue on your current medications as directed. Please refer to the Current Medication list given to you today.  *If you need a refill on your cardiac medications before your next appointment, please call your pharmacy*    Follow-Up: At [facility], you and your health needs are our priority.  As part of our continuing mission to provide you with exceptional heart care, we have created designated Provider Care Teams.  These Care Teams include your primary Cardiologist (physician) and Advanced Practice Providers (APPs -  Physician Assistants and Nurse Practitioners) who all work together to provide you with the care you need, when you need it.  We recommend signing up for the patient portal called "MyChart".  Sign up information is provided on this After Visit Summary.  MyChart is used to connect with patients for Virtual Visits (Telemedicine).  Patients are able to view lab/test results, encounter notes, upcoming appointments, etc.  Non-urgent messages can be sent to your provider as well.   To learn more about what you can do with MyChart, go to [email or web address].    Your next appointment:   6 month(s)  Provider:   Christen Bame, NP

## 2023-01-29 LAB — BASIC METABOLIC PANEL
BUN/Creatinine Ratio: 20 (ref 12–28)
BUN: 23 mg/dL (ref 8–27)
CO2: 23 mmol/L (ref 20–29)
Calcium: 9.3 mg/dL (ref 8.7–10.3)
Chloride: 104 mmol/L (ref 96–106)
Creatinine, Ser: 1.14 mg/dL — ABNORMAL HIGH (ref 0.57–1.00)
Glucose: 86 mg/dL (ref 70–99)
Potassium: 4.4 mmol/L (ref 3.5–5.2)
Sodium: 141 mmol/L (ref 134–144)
eGFR: 49 mL/min/{1.73_m2} — ABNORMAL LOW (ref >59)

## 2023-02-27 ENCOUNTER — Ambulatory Visit
Admission: RE | Admit: 2023-02-27 | Discharge: 2023-02-27 | Disposition: A | Discharge status: Home / Self Care | Payer: Medicare Other | Source: Ambulatory Visit | Attending: Family Medicine | Admitting: Family Medicine

## 2023-02-27 DIAGNOSIS — M8589 Other specified disorders of bone density and structure, multiple sites: Secondary | ICD-10-CM | POA: Diagnosis not present

## 2023-02-27 DIAGNOSIS — Z780 Asymptomatic menopausal state: Secondary | ICD-10-CM | POA: Diagnosis not present

## 2023-02-27 DIAGNOSIS — M810 Age-related osteoporosis without current pathological fracture: Secondary | ICD-10-CM

## 2023-03-13 DIAGNOSIS — M810 Age-related osteoporosis without current pathological fracture: Secondary | ICD-10-CM | POA: Diagnosis not present

## 2023-03-13 DIAGNOSIS — Z79899 Other long term (current) drug therapy: Secondary | ICD-10-CM | POA: Diagnosis not present

## 2023-03-13 DIAGNOSIS — E782 Mixed hyperlipidemia: Secondary | ICD-10-CM | POA: Diagnosis not present

## 2023-03-20 DIAGNOSIS — I25119 Atherosclerotic heart disease of native coronary artery with unspecified angina pectoris: Secondary | ICD-10-CM | POA: Diagnosis not present

## 2023-03-20 DIAGNOSIS — M810 Age-related osteoporosis without current pathological fracture: Secondary | ICD-10-CM | POA: Diagnosis not present

## 2023-03-20 DIAGNOSIS — E782 Mixed hyperlipidemia: Secondary | ICD-10-CM | POA: Diagnosis not present

## 2023-03-20 DIAGNOSIS — Z9989 Dependence on other enabling machines and devices: Secondary | ICD-10-CM | POA: Diagnosis not present

## 2023-03-20 DIAGNOSIS — N1831 Chronic kidney disease, stage 3a: Secondary | ICD-10-CM | POA: Diagnosis not present

## 2023-03-20 DIAGNOSIS — K219 Gastro-esophageal reflux disease without esophagitis: Secondary | ICD-10-CM | POA: Diagnosis not present

## 2023-03-20 DIAGNOSIS — I10 Essential (primary) hypertension: Secondary | ICD-10-CM | POA: Diagnosis not present

## 2023-03-28 DIAGNOSIS — R300 Dysuria: Secondary | ICD-10-CM | POA: Diagnosis not present

## 2023-04-03 ENCOUNTER — Encounter (HOSPITAL_COMMUNITY): Payer: Self-pay

## 2023-04-03 NOTE — Progress Notes (Addendum)
PCP - Dr. Selena Batten clearance on chart 01-11-23  Cardiologist - Leodis Sias, MD LOV 01-28-23 epic  clearance  12-20-22 Edd Fabian, NP  PPM/ICD -  Device Orders -  Rep Notified -   Chest x-ray - 09-18-22 epic EKG - 09-19-22 epic Stress Test -  ECHO - 2015 epic Cardiac Cath -   Sleep Study -  CPAP -   Fasting Blood Sugar -  Checks Blood Sugar _____ times a day  Blood Thinner Instructions:Plavix stop 5 days Aspirin Instructions:  ERAS Protcol - PRE-SURGERY Ensure   COVID vaccine -x5  Activity--Able to complete ADL's without SOB or CP Anesthesia review:CAD stent, HTN, MI, asthma,  BP 98/57  HR 50 at preop Shanda Bumps Ward PA-C aware pt. Asymptomatic just stated she felt "tired". Pt advised to call cardiology today to see if they want to adjust medications. And pt. Advised to push fluids and monitor BP at home.  Patient denies shortness of breath, fever, cough and chest pain at PAT appointment   All instructions explained to the patient, with a verbal understanding of the material. Patient agrees to go over the instructions while at home for a better understanding. Patient also instructed to self quarantine after being tested for COVID-19. The opportunity to ask questions was provided.

## 2023-04-03 NOTE — Patient Instructions (Addendum)
SURGICAL WAITING ROOM VISITATION  Patients having surgery or a procedure may have no more than 2 support people in the waiting area - these visitors may rotate.    Children under the age of 2 must have an adult with them who is not the patient.  If the patient needs to stay at the hospital during part of their recovery, the visitor guidelines for inpatient rooms apply.  Pre-op nurse will coordinate an appropriate time for 1 support person to accompany patient in pre-op.  This support person may not rotate.    Please refer to the [facility] website for the visitor guidelines for Inpatients (after your surgery is over and you are in a regular room).       Your procedure is scheduled on: 04-24-23   Report to [facility] Main Entrance    Report to admitting at       0515  AM   Call this number if you have problems the morning of surgery [phone]   Do not eat food :After Midnight.   After Midnight you may have the following liquids until _0430 _____ AM/ DAY OF SURGERY   THEN NOTHING BY MOUTH  Water Non-Citrus Juices (without pulp, NO RED-Apple, White grape, White cranberry) Black Coffee (NO MILK/CREAM OR CREAMERS, sugar ok)  Clear Tea (NO MILK/CREAM OR CREAMERS, sugar ok) regular and decaf                             Plain Jell-O (NO RED)                                           Fruit ices (not with fruit pulp, NO RED)                                     Popsicles (NO RED)                                                               Sports drinks like Gatorade (NO RED)                           If you have questions, please contact your surgeon's office.   FOLLOW ANY ADDITIONAL PRE OP INSTRUCTIONS YOU RECEIVED FROM YOUR SURGEON'S OFFICE!!!     Oral Hygiene is also important to reduce your risk of infection.                                    Remember - BRUSH YOUR TEETH THE MORNING OF SURGERY WITH YOUR REGULAR TOOTHPASTE  DENTURES WILL BE REMOVED PRIOR TO  SURGERY PLEASE DO NOT APPLY "Poly grip" OR ADHESIVES!!!   Do NOT smoke after Midnight   Take these medicines the morning of surgery with A SIP OF WATER: rosuvastatin, pantoprazole,flonase,nebivolol, claritin,inhalers and bring them with you, escitalopram, amlodipine   These are anesthesia recommendations for holding your anticoagulants.  Please contact your prescribing physician  to confirm IF it is safe to hold your anticoagulants for this length of time.   Eliquis Apixaban   72 hours   Xarelto Rivaroxaban   72 hours  Plavix Clopidogrel   120 hours  Pletal Cilostazol   120 hours    DO NOT TAKE ANY ORAL DIABETIC MEDICATIONS DAY OF YOUR SURGERY  Bring CPAP mask and tubing day of surgery.                              You may not have any metal on your body including hair pins, jewelry, and body piercing             Do not wear make-up, lotions, powders, perfumes/cologne, or deodorant  Do not wear nail polish including gel and S&S, artificial/acrylic nails, or any other type of covering on natural nails including finger and toenails. If you have artificial nails, gel coating, etc. that needs to be removed by a nail salon please have this removed prior to surgery or surgery may need to be canceled/ delayed if the surgeon/ anesthesia feels like they are unable to be safely monitored.   Do not shave  5 days  prior to surgery.                Do not bring valuables to the hospital. [facility] IS NOT             RESPONSIBLE   FOR VALUABLES.   Contacts, glasses, dentures or bridgework may not be worn into surgery.   Bring small overnight bag day of surgery.   DO NOT BRING YOUR HOME MEDICATIONS TO THE HOSPITAL. PHARMACY WILL DISPENSE MEDICATIONS LISTED ON YOUR MEDICATION LIST TO YOU DURING YOUR ADMISSION IN THE HOSPITAL!    Patients discharged on the day of surgery will not be allowed to drive home.  Someone NEEDS to stay with you for the first 24 hours after anesthesia.   Special  Instructions: Bring a copy of your healthcare power of attorney and living will documents the day of surgery if you haven't scanned them before.              Please read over the following fact sheets you were given: IF YOU HAVE QUESTIONS ABOUT YOUR PRE-OP INSTRUCTIONS PLEASE CALL [phone]   If you test positive for Covid or have been in contact with anyone that has tested positive in the last 10 days please notify you surgeon.  FOLLOW ATTACHED SHOWER INSTRUCTIONS    Incentive Spirometer  An incentive spirometer is a tool that can help keep your lungs clear and active. This tool measures how well you are filling your lungs with each breath. Taking long deep breaths may help reverse or decrease the chance of developing breathing (pulmonary) problems (especially infection) following: A long period of time when you are unable to move or be active. BEFORE THE PROCEDURE  If the spirometer includes an indicator to show your best effort, your nurse or respiratory therapist will set it to a desired goal. If possible, sit up straight or lean slightly forward. Try not to slouch. Hold the incentive spirometer in an upright position. INSTRUCTIONS FOR USE  Sit on the edge of your bed if possible, or sit up as far as you can in bed or on a chair. Hold the incentive spirometer in an upright position. Breathe out normally. Place the mouthpiece in your mouth and seal your lips tightly around  it. Breathe in slowly and as deeply as possible, raising the piston or the ball toward the top of the column. Hold your breath for 3-5 seconds or for as long as possible. Allow the piston or ball to fall to the bottom of the column. Remove the mouthpiece from your mouth and breathe out normally. Rest for a few seconds and repeat Steps 1 through 7 at least 10 times every 1-2 hours when you are awake. Take your time and take a few normal breaths between deep breaths. The spirometer may include an indicator to show  your best effort. Use the indicator as a goal to work toward during each repetition. After each set of 10 deep breaths, practice coughing to be sure your lungs are clear. If you have an incision (the cut made at the time of surgery), support your incision when coughing by placing a pillow or rolled up towels firmly against it. Once you are able to get out of bed, walk around indoors and cough well. You may stop using the incentive spirometer when instructed by your caregiver.  RISKS AND COMPLICATIONS Take your time so you do not get dizzy or light-headed. If you are in pain, you may need to take or ask for pain medication before doing incentive spirometry. It is harder to take a deep breath if you are having pain. AFTER USE Rest and breathe slowly and easily. It can be helpful to keep track of a log of your progress. Your caregiver can provide you with a simple table to help with this. If you are using the spirometer at home, follow these instructions: SEEK MEDICAL CARE IF:  You are having difficultly using the spirometer. You have trouble using the spirometer as often as instructed. Your pain medication is not giving enough relief while using the spirometer. You develop fever of 100.5 F (38.1 C) or higher. SEEK IMMEDIATE MEDICAL CARE IF:  You cough up bloody sputum that had not been present before. You develop fever of 102 F (38.9 C) or greater. You develop worsening pain at or near the incision site. MAKE SURE YOU:  Understand these instructions. Will watch your condition. Will get help right away if you are not doing well or get worse. Document Released: 04/01/2007 Document Revised: 02/11/2012 Document Reviewed: 06/02/2007 [facility] Patient Information 2014 [place], [place].   ________________________________________________________________________

## 2023-04-03 NOTE — Progress Notes (Signed)
Please place orders in epic pt. Is scheduled for preop 

## 2023-04-04 NOTE — Progress Notes (Signed)
Sent message, via epic in basket, requesting orders in epic from surgeon.  

## 2023-04-11 ENCOUNTER — Other Ambulatory Visit: Payer: Self-pay

## 2023-04-11 ENCOUNTER — Encounter: Payer: Self-pay | Admitting: Cardiovascular Disease

## 2023-04-11 ENCOUNTER — Encounter (HOSPITAL_COMMUNITY)
Admission: RE | Admit: 2023-04-11 | Discharge: 2023-04-11 | Disposition: A | Discharge status: Home / Self Care | Payer: Medicare Other | Source: Ambulatory Visit | Attending: Specialist | Admitting: Specialist

## 2023-04-11 ENCOUNTER — Encounter (HOSPITAL_COMMUNITY): Payer: Self-pay

## 2023-04-11 ENCOUNTER — Encounter (HOSPITAL_COMMUNITY): Payer: Self-pay | Admitting: Physician Assistant

## 2023-04-11 VITALS — BP 98/57 | HR 50 | Temp 98.2°F | Resp 16 | Ht 60.0 in | Wt 134.0 lb

## 2023-04-11 DIAGNOSIS — Z01812 Encounter for preprocedural laboratory examination: Secondary | ICD-10-CM | POA: Insufficient documentation

## 2023-04-11 DIAGNOSIS — Z01818 Encounter for other preprocedural examination: Secondary | ICD-10-CM

## 2023-04-11 DIAGNOSIS — I10 Essential (primary) hypertension: Secondary | ICD-10-CM | POA: Diagnosis not present

## 2023-04-11 HISTORY — DX: Unspecified osteoarthritis, unspecified site: M19.90

## 2023-04-11 HISTORY — DX: Other specified postprocedural states: Z98.890

## 2023-04-11 HISTORY — DX: Nausea with vomiting, unspecified: R11.2

## 2023-04-11 HISTORY — DX: Family history of other specified conditions: Z84.89

## 2023-04-11 HISTORY — DX: Respiratory tuberculosis unspecified: A15.9

## 2023-04-11 LAB — CBC
HCT: 40.9 % (ref 36.0–46.0)
Hemoglobin: 12.8 g/dL (ref 12.0–15.0)
MCH: 30.9 pg (ref 26.0–34.0)
MCHC: 31.3 g/dL (ref 30.0–36.0)
MCV: 98.8 fL (ref 80.0–100.0)
Platelets: 299 10*3/uL (ref 150–400)
RBC: 4.14 MIL/uL (ref 3.87–5.11)
RDW: 13.9 % (ref 11.5–15.5)
WBC: 6.6 10*3/uL (ref 4.0–10.5)
nRBC: 0 % (ref 0.0–0.2)

## 2023-04-11 LAB — BASIC METABOLIC PANEL
Anion gap: 6 (ref 5–15)
BUN: 30 mg/dL — ABNORMAL HIGH (ref 8–23)
CO2: 25 mmol/L (ref 22–32)
Calcium: 8.7 mg/dL — ABNORMAL LOW (ref 8.9–10.3)
Chloride: 107 mmol/L (ref 98–111)
Creatinine, Ser: 1.33 mg/dL — ABNORMAL HIGH (ref 0.44–1.00)
GFR, Estimated: 41 mL/min — ABNORMAL LOW (ref >60)
Glucose, Bld: 115 mg/dL — ABNORMAL HIGH (ref 70–99)
Potassium: 4.6 mmol/L (ref 3.5–5.1)
Sodium: 138 mmol/L (ref 135–145)

## 2023-04-11 LAB — SURGICAL PCR SCREEN
MRSA, PCR: NEGATIVE
Staphylococcus aureus: NEGATIVE

## 2023-04-15 DIAGNOSIS — M1712 Unilateral primary osteoarthritis, left knee: Secondary | ICD-10-CM | POA: Diagnosis not present

## 2023-04-17 ENCOUNTER — Encounter: Payer: Self-pay | Admitting: Cardiovascular Disease

## 2023-04-18 MED ORDER — IRBESARTAN 150 MG PO TABS: 150.0000 mg | ORAL_TABLET | Freq: Every day | ORAL | 3 refills | Status: DC

## 2023-04-18 NOTE — Telephone Encounter (Signed)
[name], [name] Ping, MD  You15 hours ago (5:15 PM)   BP looks good on Irbesartin 150 mg a day   Patient made aware to remain on 150mg  daily and new rx sent to pharmacy.

## 2023-04-23 NOTE — Progress Notes (Addendum)
Patient phoned to give updated information on surgery.  Patient stated that surgery was rescheduled because she had to switch surgeons.  Date of Surgery - 04-30-23  Arrival Time - check in at admitting at 9:00 AM.    NPO Status - patient reminded to not eat solid food after midnight.  From midnight until 8:30 AM day of surgery patient may have clear liquids  Medications morning of surgery - Rosuvastatin, Pantoprazole, Nebivolol, Claritin, Escitalopram, Amlodipine.  Patient advised okay to use inhalers and  nasal spray.  Patient aware to hold Plavix 5 days before surgery.   No change in medical history, allergies per patient.  All questions answered and patient stated understanding

## 2023-04-23 NOTE — Progress Notes (Signed)
Surgery orders requested via Epic inbox. °

## 2023-04-23 NOTE — Anesthesia Preprocedure Evaluation (Addendum)
Anesthesia Evaluation  Patient identified by MRN, date of birth, ID band Patient awake    Reviewed: Allergy & Precautions, H&P , NPO status , Patient's Chart, lab work & pertinent test results  History of Anesthesia Complications (+) PONV and history of anesthetic complications  Airway Mallampati: II  TM Distance: >3 FB Neck ROM: Full    Dental no notable dental hx.    Pulmonary asthma , former smoker   Pulmonary exam normal breath sounds clear to auscultation       Cardiovascular hypertension, Pt. on medications + CAD and + Cardiac Stents  Normal cardiovascular exam Rhythm:Regular Rate:Normal     Neuro/Psych   Anxiety     negative neurological ROS  negative psych ROS   GI/Hepatic Neg liver ROS,GERD  ,,  Endo/Other  negative endocrine ROS    Renal/GU negative Renal ROS  negative genitourinary   Musculoskeletal  (+) Arthritis , Osteoarthritis,    Abdominal   Peds negative pediatric ROS (+)  Hematology negative hematology ROS (+)   Anesthesia Other Findings   Reproductive/Obstetrics negative OB ROS                             Anesthesia Physical Anesthesia Plan  ASA: 3  Anesthesia Plan: MAC and Spinal   Post-op Pain Management: Regional block* and Minimal or no pain anticipated   Induction: Intravenous  PONV Risk Score and Plan: 3 and Ondansetron, Dexamethasone, Midazolam and Treatment may vary due to age or medical condition  Airway Management Planned: Simple Face Mask  Additional Equipment:   Intra-op Plan:   Post-operative Plan:   Informed Consent: I have reviewed the patients History and Physical, chart, labs and discussed the procedure including the risks, benefits and alternatives for the proposed anesthesia with the patient or authorized representative who has indicated his/her understanding and acceptance.     Dental advisory given  Plan Discussed with:  CRNA  Anesthesia Plan Comments: (See PAT note 04/23/2023)       Anesthesia Quick Evaluation

## 2023-04-23 NOTE — Progress Notes (Signed)
Anesthesia Chart Review   Case: [id number] Date/Time: 04/30/23 1115   Procedure: TOTAL KNEE ARTHROPLASTY (Left: Knee)   Anesthesia type: Spinal   Pre-op diagnosis: Left knee osteoarthritis   Location: [facility] ROOM 10 / [facility]   Surgeons: Durene Romans, MD       DISCUSSION:77 y.o. former smoker with h/o PONV, HTN, asthma, CAD (stent 2004), PVC, left knee OA scheduled for above procedure 04/30/2023 with Dr. Durene Romans.   Patient last seen by cardiology 01/28/2023.  Per office visit note patient stable at this visit, patient asymptomatic.  Advised to follow-up in 6 months.  Patient with hypertension at PAT visit advised to reach out to PCP.  Patient was advised by cardiology to decrease irbesartan to 150 mg.  Improvement in BP after decreasing med.  Evaluate DOS. VS: There were no vitals taken for this visit.  PROVIDERS: Gweneth Dimitri, MD is PCP   Cardiologist - Leodis Sias, MD  LABS: Labs reviewed: Acceptable for surgery. (all labs ordered are listed, but only abnormal results are displayed)  Labs Reviewed - No data to display   IMAGES:   EKG:   CV: Echo 06/09/2014 Study Conclusions   - Left ventricle: The cavity size was normal. Systolic function was    normal. The estimated ejection fraction was in the range of 60%    to 65%. Wall motion was normal; there were no regional wall    motion abnormalities. Left ventricular diastolic function    parameters were normal.  - Aortic valve: There was no regurgitation.  - Aortic root: The aortic root was normal in size.  - Mitral valve: Structurally normal valve. There was trivial    regurgitation.  - Left atrium: The atrium was normal in size.  - Right ventricle: Systolic function was normal.  - Right atrium: The atrium was normal in size.  - Tricuspid valve: There was mild regurgitation.  - Pulmonic valve: There was no regurgitation.  - Pulmonary arteries: Systolic pressure was within the normal    range.  - Inferior vena  cava: The vessel was normal in size.  - Pericardium, extracardiac: There was no pericardial effusion.   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 NSTEMI 8/04 => LHC 07/2003:  mLAD hazy 75% => Taxus DES, EF 30% with ant-apical, apical and inf-apical AK.  b.  Echo 4/05: EF 60%.  c.  ETT/Lexiscan Myoview 06/2011: EF 80%, no ischemia, normal wall motion   Cataract    Diverticulosis 11/13/2004   Family history of adverse reaction to anesthesia    Sister PONV   GERD (gastroesophageal reflux disease)    Hyperplastic colon polyp 10/16/1999   Hypertension    IBS (irritable bowel syndrome)    Internal and external hemorrhoids without complication 11/13/2004   MI (myocardial infarction) (HCC) 12/03/2002   Anteroapical MI with PCI to the LAD   PONV (postoperative nausea and vomiting)    Positive PPD    PVC (premature ventricular contraction)    Tuberculosis    Many years ago tested positive just did CXR 's    Past Surgical History:  Procedure Laterality Date   APPENDECTOMY     bilateral mastectomy  pt. was 77 years old   BIOPSY  06/21/2021   Procedure: BIOPSY;  Surgeon: Benancio Deeds, MD;  Location: [facility];  Service: Gastroenterology;;   CARDIOVASCULAR STRESS TEST  07/01/2006   ef 80%   CORONARY ANGIOPLASTY WITH STENT PLACEMENT  12/03/2002   LAD   ESOPHAGOGASTRODUODENOSCOPY N/A 06/21/2021   Procedure: ESOPHAGOGASTRODUODENOSCOPY (EGD);  Surgeon: Benancio Deeds, MD;  Location: [facility];  Service: Gastroenterology;  Laterality: N/A;   knee srthroscopy Left    NASAL SINUS SURGERY Left 09/25/2021   Procedure: LEFT ENDOSCOPIC MAXILLARY ANTROSTOMY;  Surgeon: Serena Colonel, MD;  Location: [facility];  Service: ENT;  Laterality:  Left;   US ECHOCARDIOGRAPHY  03/27/2004   resting ef 60%    MEDICATIONS:  0.9 %  sodium chloride infusion    acetaminophen (TYLENOL) 500 MG tablet   albuterol (VENTOLIN HFA) 108 (90 Base) MCG/ACT inhaler   amLODipine (NORVASC) 5 MG tablet   Calcium Carbonate (CALCIUM 600 PO)   clopidogrel (PLAVIX) 75 MG tablet   conjugated estrogens (PREMARIN) vaginal cream   denosumab (PROLIA) 60 MG/ML SOSY injection   docusate sodium (COLACE) 50 MG capsule   escitalopram (LEXAPRO) 10 MG tablet   fluticasone (FLONASE) 50 MCG/ACT nasal spray   Fluticasone Furoate (ARNUITY ELLIPTA) 200 MCG/ACT AEPB   irbesartan (AVAPRO) 150 MG tablet   ketotifen (ZADITOR) 0.025 % ophthalmic solution   loratadine (CLARITIN) 10 MG tablet   metroNIDAZOLE (METROGEL) 0.75 % gel   nebivolol (BYSTOLIC) 5 MG tablet   nitroGLYCERIN (NITROSTAT) 0.4 MG SL tablet   pantoprazole (PROTONIX) 40 MG tablet   polyvinyl alcohol (LIQUIFILM TEARS) 1.4 % ophthalmic solution   potassium chloride SA (KLOR-CON M20) 20 MEQ tablet   rosuvastatin (CRESTOR) 5 MG tablet   Simethicone (GAS-X MAXIMUM STRENGTH PO)   spironolactone (ALDACTONE) 25 MG tablet    [place] [facility] Ward, PA-C [facility] [phone]

## 2023-04-24 ENCOUNTER — Encounter (HOSPITAL_COMMUNITY): Admission: RE | Payer: Self-pay | Source: Ambulatory Visit

## 2023-04-24 ENCOUNTER — Ambulatory Visit (HOSPITAL_COMMUNITY): Admission: RE | Admit: 2023-04-24 | Payer: Medicare Other | Source: Ambulatory Visit | Admitting: Specialist

## 2023-04-24 SURGERY — ARTHROPLASTY, KNEE, TOTAL
Anesthesia: Spinal | Site: Knee | Laterality: Left

## 2023-04-30 ENCOUNTER — Observation Stay (HOSPITAL_COMMUNITY)
Admission: RE | Admit: 2023-04-30 | Discharge: 2023-05-01 | Disposition: A | Discharge status: Home / Self Care | Payer: Medicare Other | Attending: Orthopedic Surgery | Admitting: Orthopedic Surgery

## 2023-04-30 ENCOUNTER — Ambulatory Visit (HOSPITAL_BASED_OUTPATIENT_CLINIC_OR_DEPARTMENT_OTHER): Payer: Medicare Other | Admitting: Physician Assistant

## 2023-04-30 ENCOUNTER — Encounter (HOSPITAL_COMMUNITY): Payer: Self-pay | Admitting: Orthopedic Surgery

## 2023-04-30 ENCOUNTER — Other Ambulatory Visit: Payer: Self-pay

## 2023-04-30 ENCOUNTER — Ambulatory Visit (HOSPITAL_COMMUNITY): Payer: Medicare Other | Admitting: Physician Assistant

## 2023-04-30 ENCOUNTER — Encounter (HOSPITAL_COMMUNITY)
Admission: RE | Disposition: A | Discharge status: Home / Self Care | Payer: Self-pay | Source: Home / Self Care | Attending: Orthopedic Surgery

## 2023-04-30 DIAGNOSIS — Z7902 Long term (current) use of antithrombotics/antiplatelets: Secondary | ICD-10-CM | POA: Insufficient documentation

## 2023-04-30 DIAGNOSIS — I10 Essential (primary) hypertension: Secondary | ICD-10-CM | POA: Diagnosis not present

## 2023-04-30 DIAGNOSIS — I2510 Atherosclerotic heart disease of native coronary artery without angina pectoris: Secondary | ICD-10-CM | POA: Insufficient documentation

## 2023-04-30 DIAGNOSIS — M1712 Unilateral primary osteoarthritis, left knee: Secondary | ICD-10-CM | POA: Diagnosis not present

## 2023-04-30 DIAGNOSIS — Z853 Personal history of malignant neoplasm of breast: Secondary | ICD-10-CM | POA: Diagnosis not present

## 2023-04-30 DIAGNOSIS — J45909 Unspecified asthma, uncomplicated: Secondary | ICD-10-CM | POA: Diagnosis not present

## 2023-04-30 DIAGNOSIS — Z955 Presence of coronary angioplasty implant and graft: Secondary | ICD-10-CM | POA: Diagnosis not present

## 2023-04-30 DIAGNOSIS — G8918 Other acute postprocedural pain: Secondary | ICD-10-CM | POA: Diagnosis not present

## 2023-04-30 DIAGNOSIS — Z7952 Long term (current) use of systemic steroids: Secondary | ICD-10-CM | POA: Diagnosis not present

## 2023-04-30 DIAGNOSIS — Z87891 Personal history of nicotine dependence: Secondary | ICD-10-CM | POA: Insufficient documentation

## 2023-04-30 DIAGNOSIS — M1711 Unilateral primary osteoarthritis, right knee: Principal | ICD-10-CM | POA: Insufficient documentation

## 2023-04-30 DIAGNOSIS — Z79899 Other long term (current) drug therapy: Secondary | ICD-10-CM | POA: Insufficient documentation

## 2023-04-30 DIAGNOSIS — Z96652 Presence of left artificial knee joint: Secondary | ICD-10-CM

## 2023-04-30 HISTORY — PX: TOTAL KNEE ARTHROPLASTY: SHX125

## 2023-04-30 SURGERY — ARTHROPLASTY, KNEE, TOTAL
Anesthesia: Monitor Anesthesia Care | Site: Knee | Laterality: Left

## 2023-04-30 MED ORDER — STERILE WATER FOR IRRIGATION IR SOLN
Status: DC | PRN
  Administered 2023-04-30: 2000 mL

## 2023-04-30 MED ORDER — IRBESARTAN 150 MG PO TABS
150.0000 mg | ORAL_TABLET | Freq: Every day | ORAL | Status: DC
  Administered 2023-05-01: 150 mg via ORAL
  Filled 2023-04-30: qty 1

## 2023-04-30 MED ORDER — TRANEXAMIC ACID-NACL 1000-0.7 MG/100ML-% IV SOLN
1000.0000 mg | INTRAVENOUS | Status: AC
  Administered 2023-04-30: 1000 mg via INTRAVENOUS
  Filled 2023-04-30: qty 100

## 2023-04-30 MED ORDER — VANCOMYCIN HCL IN DEXTROSE 1-5 GM/200ML-% IV SOLN
1000.0000 mg | INTRAVENOUS | Status: DC
  Filled 2023-04-30: qty 200

## 2023-04-30 MED ORDER — LORATADINE 10 MG PO TABS
10.0000 mg | ORAL_TABLET | Freq: Every day | ORAL | Status: DC
  Administered 2023-05-01: 10 mg via ORAL
  Filled 2023-04-30 (×2): qty 1

## 2023-04-30 MED ORDER — AMLODIPINE BESYLATE 5 MG PO TABS
5.0000 mg | ORAL_TABLET | Freq: Every day | ORAL | Status: DC
  Administered 2023-05-01: 5 mg via ORAL
  Filled 2023-04-30: qty 1

## 2023-04-30 MED ORDER — TRANEXAMIC ACID-NACL 1000-0.7 MG/100ML-% IV SOLN
1000.0000 mg | Freq: Once | INTRAVENOUS | Status: AC
  Administered 2023-04-30: 1000 mg via INTRAVENOUS
  Filled 2023-04-30: qty 100

## 2023-04-30 MED ORDER — ALBUTEROL SULFATE (2.5 MG/3ML) 0.083% IN NEBU: 2.5000 mg | INHALATION_SOLUTION | RESPIRATORY_TRACT | Status: DC | PRN

## 2023-04-30 MED ORDER — CLOPIDOGREL BISULFATE 75 MG PO TABS
75.0000 mg | ORAL_TABLET | Freq: Every day | ORAL | Status: DC
  Administered 2023-05-01: 75 mg via ORAL
  Filled 2023-04-30: qty 1

## 2023-04-30 MED ORDER — KETOROLAC TROMETHAMINE 30 MG/ML IJ SOLN
INTRAMUSCULAR | Status: DC | PRN
  Administered 2023-04-30: 30 mg

## 2023-04-30 MED ORDER — METHOCARBAMOL 500 MG PO TABS
500.0000 mg | ORAL_TABLET | Freq: Four times a day (QID) | ORAL | Status: DC | PRN
  Administered 2023-04-30: 500 mg via ORAL
  Filled 2023-04-30: qty 1

## 2023-04-30 MED ORDER — SODIUM CHLORIDE (PF) 0.9 % IJ SOLN
INTRAMUSCULAR | Status: DC | PRN
  Administered 2023-04-30: 30 mL

## 2023-04-30 MED ORDER — DEXAMETHASONE SODIUM PHOSPHATE 10 MG/ML IJ SOLN
10.0000 mg | Freq: Once | INTRAMUSCULAR | Status: AC
  Administered 2023-05-01: 10 mg via INTRAVENOUS
  Filled 2023-04-30: qty 1

## 2023-04-30 MED ORDER — LACTATED RINGERS IV SOLN: INTRAVENOUS | Status: DC

## 2023-04-30 MED ORDER — HYDROMORPHONE HCL 1 MG/ML IJ SOLN: 0.5000 mg | INTRAMUSCULAR | Status: DC | PRN

## 2023-04-30 MED ORDER — SPIRONOLACTONE 25 MG PO TABS
25.0000 mg | ORAL_TABLET | Freq: Every day | ORAL | Status: DC
  Administered 2023-05-01: 25 mg via ORAL
  Filled 2023-04-30: qty 1

## 2023-04-30 MED ORDER — DOCUSATE SODIUM 100 MG PO CAPS
100.0000 mg | ORAL_CAPSULE | Freq: Two times a day (BID) | ORAL | Status: DC
  Administered 2023-04-30 – 2023-05-01 (×2): 100 mg via ORAL
  Filled 2023-04-30 (×2): qty 1

## 2023-04-30 MED ORDER — KETOROLAC TROMETHAMINE 30 MG/ML IJ SOLN
INTRAMUSCULAR | Status: AC
  Filled 2023-04-30: qty 1

## 2023-04-30 MED ORDER — OXYCODONE HCL 5 MG PO TABS: 5.0000 mg | ORAL_TABLET | Freq: Once | ORAL | Status: DC | PRN

## 2023-04-30 MED ORDER — SODIUM CHLORIDE (PF) 0.9 % IJ SOLN
INTRAMUSCULAR | Status: AC
  Filled 2023-04-30: qty 50

## 2023-04-30 MED ORDER — PROPOFOL 500 MG/50ML IV EMUL
INTRAVENOUS | Status: DC | PRN
  Administered 2023-04-30: 75 ug/kg/min via INTRAVENOUS

## 2023-04-30 MED ORDER — SODIUM CHLORIDE 0.9 % IR SOLN
Status: DC | PRN
  Administered 2023-04-30: 1000 mL

## 2023-04-30 MED ORDER — BUPIVACAINE-EPINEPHRINE (PF) 0.25% -1:200000 IJ SOLN
INTRAMUSCULAR | Status: DC | PRN
  Administered 2023-04-30: 30 mL

## 2023-04-30 MED ORDER — METOCLOPRAMIDE HCL 5 MG/ML IJ SOLN: 5.0000 mg | Freq: Three times a day (TID) | INTRAMUSCULAR | Status: DC | PRN

## 2023-04-30 MED ORDER — FLUTICASONE PROPIONATE 50 MCG/ACT NA SUSP
1.0000 | Freq: Two times a day (BID) | NASAL | Status: DC
  Filled 2023-04-30: qty 16

## 2023-04-30 MED ORDER — BISACODYL 10 MG RE SUPP: 10.0000 mg | Freq: Every day | RECTAL | Status: DC | PRN

## 2023-04-30 MED ORDER — OXYCODONE HCL 5 MG PO TABS
10.0000 mg | ORAL_TABLET | ORAL | Status: DC | PRN
  Administered 2023-04-30: 10 mg via ORAL

## 2023-04-30 MED ORDER — EPINEPHRINE PF 1 MG/ML IJ SOLN
INTRAMUSCULAR | Status: AC
  Filled 2023-04-30: qty 1

## 2023-04-30 MED ORDER — ONDANSETRON HCL 4 MG/2ML IJ SOLN
INTRAMUSCULAR | Status: DC | PRN
  Administered 2023-04-30: 4 mg via INTRAVENOUS

## 2023-04-30 MED ORDER — METHOCARBAMOL 500 MG IVPB - SIMPLE MED: 500.0000 mg | Freq: Four times a day (QID) | INTRAVENOUS | Status: DC | PRN

## 2023-04-30 MED ORDER — ESCITALOPRAM OXALATE 10 MG PO TABS
10.0000 mg | ORAL_TABLET | Freq: Every day | ORAL | Status: DC
  Administered 2023-05-01: 10 mg via ORAL
  Filled 2023-04-30: qty 1

## 2023-04-30 MED ORDER — CHLORHEXIDINE GLUCONATE 0.12 % MT SOLN
15.0000 mL | Freq: Once | OROMUCOSAL | Status: AC
  Administered 2023-04-30: 15 mL via OROMUCOSAL

## 2023-04-30 MED ORDER — BUDESONIDE 0.25 MG/2ML IN SUSP
0.2500 mg | Freq: Two times a day (BID) | RESPIRATORY_TRACT | Status: DC
  Administered 2023-04-30 – 2023-05-01 (×2): 0.25 mg via RESPIRATORY_TRACT
  Filled 2023-04-30 (×2): qty 2

## 2023-04-30 MED ORDER — ROSUVASTATIN CALCIUM 5 MG PO TABS
5.0000 mg | ORAL_TABLET | Freq: Every day | ORAL | Status: DC
  Administered 2023-04-30: 5 mg via ORAL
  Filled 2023-04-30: qty 1

## 2023-04-30 MED ORDER — CEFAZOLIN SODIUM-DEXTROSE 2-4 GM/100ML-% IV SOLN
2.0000 g | Freq: Once | INTRAVENOUS | Status: AC
  Administered 2023-04-30: 2 g via INTRAVENOUS

## 2023-04-30 MED ORDER — POTASSIUM CHLORIDE CRYS ER 20 MEQ PO TBCR
20.0000 meq | EXTENDED_RELEASE_TABLET | Freq: Every day | ORAL | Status: DC
  Administered 2023-04-30 – 2023-05-01 (×2): 20 meq via ORAL
  Filled 2023-04-30 (×2): qty 1

## 2023-04-30 MED ORDER — ACETAMINOPHEN 500 MG PO TABS
1000.0000 mg | ORAL_TABLET | Freq: Four times a day (QID) | ORAL | Status: DC
  Administered 2023-04-30 – 2023-05-01 (×4): 1000 mg via ORAL
  Filled 2023-04-30 (×4): qty 2

## 2023-04-30 MED ORDER — 0.9 % SODIUM CHLORIDE (POUR BTL) OPTIME
TOPICAL | Status: DC | PRN
  Administered 2023-04-30: 1000 mL

## 2023-04-30 MED ORDER — METOCLOPRAMIDE HCL 5 MG PO TABS: 5.0000 mg | ORAL_TABLET | Freq: Three times a day (TID) | ORAL | Status: DC | PRN

## 2023-04-30 MED ORDER — EPHEDRINE SULFATE (PRESSORS) 50 MG/ML IJ SOLN
INTRAMUSCULAR | Status: DC | PRN
  Administered 2023-04-30 (×2): 10 mg via INTRAVENOUS

## 2023-04-30 MED ORDER — HYDROMORPHONE HCL 1 MG/ML IJ SOLN: 0.2500 mg | INTRAMUSCULAR | Status: DC | PRN

## 2023-04-30 MED ORDER — BUPIVACAINE HCL 0.25 % IJ SOLN
INTRAMUSCULAR | Status: AC
  Filled 2023-04-30: qty 1

## 2023-04-30 MED ORDER — MENTHOL 3 MG MT LOZG: 1.0000 | LOZENGE | OROMUCOSAL | Status: DC | PRN

## 2023-04-30 MED ORDER — OXYCODONE HCL 5 MG/5ML PO SOLN: 5.0000 mg | Freq: Once | ORAL | Status: DC | PRN

## 2023-04-30 MED ORDER — NEBIVOLOL HCL 5 MG PO TABS
5.0000 mg | ORAL_TABLET | Freq: Every day | ORAL | Status: DC
  Administered 2023-05-01: 5 mg via ORAL
  Filled 2023-04-30: qty 1

## 2023-04-30 MED ORDER — MIDAZOLAM HCL 2 MG/2ML IJ SOLN
1.0000 mg | INTRAMUSCULAR | Status: DC
  Administered 2023-04-30: 2 mg via INTRAVENOUS
  Filled 2023-04-30: qty 2

## 2023-04-30 MED ORDER — CEFAZOLIN SODIUM-DEXTROSE 2-4 GM/100ML-% IV SOLN
2.0000 g | Freq: Four times a day (QID) | INTRAVENOUS | Status: AC
  Administered 2023-04-30 (×2): 2 g via INTRAVENOUS
  Filled 2023-04-30 (×2): qty 100

## 2023-04-30 MED ORDER — PANTOPRAZOLE SODIUM 40 MG PO TBEC
40.0000 mg | DELAYED_RELEASE_TABLET | Freq: Every day | ORAL | Status: DC
  Administered 2023-05-01: 40 mg via ORAL
  Filled 2023-04-30: qty 1

## 2023-04-30 MED ORDER — SODIUM CHLORIDE 0.9 % IV SOLN: INTRAVENOUS | Status: DC

## 2023-04-30 MED ORDER — PHENOL 1.4 % MT LIQD: 1.0000 | OROMUCOSAL | Status: DC | PRN

## 2023-04-30 MED ORDER — POLYETHYLENE GLYCOL 3350 17 G PO PACK
17.0000 g | PACK | Freq: Two times a day (BID) | ORAL | Status: DC
  Filled 2023-04-30 (×2): qty 1

## 2023-04-30 MED ORDER — LEVOFLOXACIN IN D5W 500 MG/100ML IV SOLN
500.0000 mg | INTRAVENOUS | Status: DC
  Filled 2023-04-30: qty 100

## 2023-04-30 MED ORDER — POVIDONE-IODINE 10 % EX SWAB: 2.0000 | Freq: Once | CUTANEOUS | Status: DC

## 2023-04-30 MED ORDER — PROMETHAZINE HCL 25 MG/ML IJ SOLN: 6.2500 mg | INTRAMUSCULAR | Status: DC | PRN

## 2023-04-30 MED ORDER — OXYCODONE HCL 5 MG PO TABS
5.0000 mg | ORAL_TABLET | ORAL | Status: DC | PRN
  Administered 2023-04-30 – 2023-05-01 (×2): 5 mg via ORAL
  Filled 2023-04-30: qty 1
  Filled 2023-04-30: qty 2
  Filled 2023-04-30: qty 1

## 2023-04-30 MED ORDER — ROPIVACAINE HCL 5 MG/ML IJ SOLN
INTRAMUSCULAR | Status: DC | PRN
  Administered 2023-04-30: 20 mL via PERINEURAL

## 2023-04-30 MED ORDER — ONDANSETRON HCL 4 MG PO TABS: 4.0000 mg | ORAL_TABLET | Freq: Four times a day (QID) | ORAL | Status: DC | PRN

## 2023-04-30 MED ORDER — BUPIVACAINE HCL (PF) 0.75 % IJ SOLN
INTRAMUSCULAR | Status: DC | PRN
  Administered 2023-04-30: 1.6 mL via INTRATHECAL

## 2023-04-30 MED ORDER — PHENYLEPHRINE HCL (PRESSORS) 10 MG/ML IV SOLN
INTRAVENOUS | Status: DC | PRN
  Administered 2023-04-30 (×2): 160 ug via INTRAVENOUS

## 2023-04-30 MED ORDER — DIPHENHYDRAMINE HCL 12.5 MG/5ML PO ELIX
12.5000 mg | ORAL_SOLUTION | ORAL | Status: DC | PRN
  Administered 2023-04-30: 25 mg via ORAL
  Filled 2023-04-30: qty 10

## 2023-04-30 MED ORDER — ORAL CARE MOUTH RINSE: 15.0000 mL | Freq: Once | OROMUCOSAL | Status: AC

## 2023-04-30 MED ORDER — DEXAMETHASONE SODIUM PHOSPHATE 10 MG/ML IJ SOLN
8.0000 mg | Freq: Once | INTRAMUSCULAR | Status: AC
  Administered 2023-04-30: 8 mg via INTRAVENOUS

## 2023-04-30 MED ORDER — KETOTIFEN FUMARATE 0.035 % OP SOLN
1.0000 [drp] | Freq: Two times a day (BID) | OPHTHALMIC | Status: DC
  Filled 2023-04-30: qty 5

## 2023-04-30 MED ORDER — NITROGLYCERIN 0.4 MG SL SUBL: 0.4000 mg | SUBLINGUAL_TABLET | SUBLINGUAL | Status: DC | PRN

## 2023-04-30 MED ORDER — ONDANSETRON HCL 4 MG/2ML IJ SOLN: 4.0000 mg | Freq: Four times a day (QID) | INTRAMUSCULAR | Status: DC | PRN

## 2023-04-30 MED ORDER — FENTANYL CITRATE PF 50 MCG/ML IJ SOSY
50.0000 ug | PREFILLED_SYRINGE | INTRAMUSCULAR | Status: DC
  Administered 2023-04-30: 50 ug via INTRAVENOUS
  Filled 2023-04-30: qty 2

## 2023-04-30 SURGICAL SUPPLY — 51 items
ADH SKN CLS APL DERMABOND .7 (GAUZE/BANDAGES/DRESSINGS) ×1
ATTUNE MED ANAT PAT 32 KNEE (Knees) IMPLANT
BAG COUNTER SPONGE SURGICOUNT (BAG) IMPLANT
BAG SPNG CNTER NS LX DISP (BAG)
BASEPLATE TIB CMT FB PCKT SZ3 (Knees) IMPLANT
BLADE SAW SGTL 13.0X1.19X90.0M (BLADE) ×2 IMPLANT
BNDG CMPR 5X62 HK CLSR LF (GAUZE/BANDAGES/DRESSINGS) ×1
BNDG ELASTIC 6INX 5YD STR LF (GAUZE/BANDAGES/DRESSINGS) ×2 IMPLANT
BOWL SMART MIX CTS (DISPOSABLE) ×2 IMPLANT
BSPLAT TIB 3 CMNT FXBRNG STRL (Knees) ×1 IMPLANT
CEMENT HV SMART SET (Cement) IMPLANT
COMP FEM CMT ATTUNE NRS 4 LT (Joint) ×1 IMPLANT
COMPONENT FEM CMT ATTN NRS 4LT (Joint) IMPLANT
COVER SURGICAL LIGHT HANDLE (MISCELLANEOUS) ×2 IMPLANT
CUFF TOURN SGL QUICK 34 (TOURNIQUET CUFF) ×1
CUFF TRNQT CYL 34X4.125X (TOURNIQUET CUFF) ×2 IMPLANT
DERMABOND ADVANCED .7 DNX12 (GAUZE/BANDAGES/DRESSINGS) ×2 IMPLANT
DRAPE U-SHAPE 47X51 STRL (DRAPES) ×2 IMPLANT
DRESSING AQUACEL AG SP 3.5X10 (GAUZE/BANDAGES/DRESSINGS) ×2 IMPLANT
DRSG AQUACEL AG SP 3.5X10 (GAUZE/BANDAGES/DRESSINGS) ×1
DURAPREP 26ML APPLICATOR (WOUND CARE) ×4 IMPLANT
ELECT REM PT RETURN 15FT ADLT (MISCELLANEOUS) ×2 IMPLANT
GLOVE BIO SURGEON STRL SZ 6 (GLOVE) ×2 IMPLANT
GLOVE BIOGEL PI IND STRL 6.5 (GLOVE) ×2 IMPLANT
GLOVE BIOGEL PI IND STRL 7.5 (GLOVE) ×2 IMPLANT
GLOVE ORTHO TXT STRL SZ7.5 (GLOVE) ×4 IMPLANT
GOWN STRL REUS W/ TWL LRG LVL3 (GOWN DISPOSABLE) ×4 IMPLANT
GOWN STRL REUS W/TWL LRG LVL3 (GOWN DISPOSABLE) ×2
HANDPIECE INTERPULSE COAX TIP (DISPOSABLE) ×1
HOLDER FOLEY CATH W/STRAP (MISCELLANEOUS) IMPLANT
INSERT TIB FB ATTUNE 4 12 LT (Insert) IMPLANT
KIT TURNOVER KIT A (KITS) IMPLANT
MANIFOLD NEPTUNE II (INSTRUMENTS) ×2 IMPLANT
NDL SAFETY ECLIP 18X1.5 (MISCELLANEOUS) IMPLANT
NS IRRIG 1000ML POUR BTL (IV SOLUTION) ×2 IMPLANT
PACK TOTAL KNEE CUSTOM (KITS) ×2 IMPLANT
PIN FIX SIGMA LCS THRD HI (PIN) IMPLANT
PROTECTOR NERVE ULNAR (MISCELLANEOUS) ×2 IMPLANT
SET HNDPC FAN SPRY TIP SCT (DISPOSABLE) ×2 IMPLANT
SET PAD KNEE POSITIONER (MISCELLANEOUS) ×2 IMPLANT
SUT MNCRL AB 4-0 PS2 18 (SUTURE) ×2 IMPLANT
SUT STRATAFIX PDS+ 0 24IN (SUTURE) ×2 IMPLANT
SUT VIC AB 1 CT1 36 (SUTURE) ×2 IMPLANT
SUT VIC AB 2-0 CT1 27 (SUTURE) ×2
SUT VIC AB 2-0 CT1 TAPERPNT 27 (SUTURE) ×4 IMPLANT
SYR 3ML LL SCALE MARK (SYRINGE) ×2 IMPLANT
TOWEL GREEN STERILE FF (TOWEL DISPOSABLE) ×2 IMPLANT
TRAY FOLEY MTR SLVR 16FR STAT (SET/KITS/TRAYS/PACK) ×2 IMPLANT
TUBE SUCTION HIGH CAP CLEAR NV (SUCTIONS) ×2 IMPLANT
WATER STERILE IRR 1000ML POUR (IV SOLUTION) ×4 IMPLANT
WRAP KNEE MAXI GEL POST OP (GAUZE/BANDAGES/DRESSINGS) ×2 IMPLANT

## 2023-04-30 NOTE — H&P (Signed)
TOTAL KNEE ADMISSION H&P  Patient is being admitted for left total knee arthroplasty.  Subjective:  Chief Complaint:left knee pain.  HPI: [name] [name], 77 y.o. female, has a history of pain and functional disability in the left knee due to arthritis and has failed non-surgical conservative treatments for greater than 12 weeks to includeNSAID's and/or analgesics, corticosteriod injections, and activity modification.  Onset of symptoms was gradual, starting 2 years ago with gradually worsening course since that time. The patient noted prior procedures on the knee to include  arthroscopy and menisectomy on the left knee(s).  Patient currently rates pain in the left knee(s) at 8 out of 10 with activity. Patient has worsening of pain with activity and weight bearing, pain that interferes with activities of daily living, and pain with passive range of motion.  Patient has evidence of joint space narrowing by imaging studies. There is no active infection.  Patient Active Problem List   Diagnosis Date Noted   Palpitations 09/20/2022   Anemia    Acute gastric ulcer with hemorrhage    Upper GI bleed 06/20/2021   Hypokalemia 06/20/2021   Animal bite of left lower leg 03/02/2014   Hyperlipidemia 07/18/2011   Essential hypertension 04/04/2010   CONSTIPATION 12/05/2009   EXTERNAL HEMORRHOIDS 11/28/2009   Diverticulosis of colon 11/28/2009   COLONIC POLYPS, HYPERPLASTIC, HX OF 11/28/2009   CAD (coronary artery disease) 12/11/2007   RHINOSINUSITIS, RECURRENT 12/11/2007   ALLERGIC RHINITIS 12/11/2007   Asthma 12/11/2007   Past Medical History:  Diagnosis Date   Allergic rhinitis    Anemia    Anxiety disorder    Arthritis    Asthma    Blood transfusion without reported diagnosis    Breast cancer (HCC)    bilateral   precancerous had double mastectomy   CAD (coronary artery disease)    a. NSTEMI 8/04 => LHC 07/2003:  mLAD hazy 75% => Taxus DES, EF 30% with ant-apical, apical and inf-apical  AK.  b.  Echo 4/05: EF 60%.  c.  ETT/Lexiscan Myoview 06/2011: EF 80%, no ischemia, normal wall motion   Cataract    Diverticulosis 11/13/2004   Family history of adverse reaction to anesthesia    Sister PONV   GERD (gastroesophageal reflux disease)    Hyperplastic colon polyp 10/16/1999   Hypertension    IBS (irritable bowel syndrome)    Internal and external hemorrhoids without complication 11/13/2004   MI (myocardial infarction) (HCC) 12/03/2002   Anteroapical MI with PCI to the LAD   PONV (postoperative nausea and vomiting)    Positive PPD    PVC (premature ventricular contraction)    Tuberculosis    Many years ago tested positive just did CXR 's    Past Surgical History:  Procedure Laterality Date   APPENDECTOMY     bilateral mastectomy     pt. was 77 years old   BIOPSY  06/21/2021   Procedure: BIOPSY;  Surgeon: Benancio Deeds, MD;  [facility];  Service: Gastroenterology;;   CARDIOVASCULAR STRESS TEST  07/01/2006   ef 80%   CORONARY ANGIOPLASTY WITH STENT PLACEMENT  12/03/2002   LAD   ESOPHAGOGASTRODUODENOSCOPY N/A 06/21/2021   Procedure: ESOPHAGOGASTRODUODENOSCOPY (EGD);  Surgeon: Benancio Deeds, MD;  Location: [facility];  Service: Gastroenterology;  Laterality: N/A;   knee srthroscopy Left    NASAL SINUS SURGERY Left 09/25/2021   Procedure: LEFT ENDOSCOPIC MAXILLARY ANTROSTOMY;  Surgeon: Serena Colonel, MD;  Location: [facility];  Service: ENT;  Laterality: Left;  US ECHOCARDIOGRAPHY  03/27/2004   resting ef 60%    Current Facility-Administered Medications  Medication Dose Route Frequency Provider Last Rate Last Admin   0.9 %  sodium chloride infusion  500 mL Intravenous Once Nandigam, Eleonore Chiquito, MD       Current Outpatient Medications  Medication Sig Dispense Refill Last Dose   acetaminophen (TYLENOL) 500 MG tablet Take 500-1,000 mg by mouth every 6 (six) hours as needed for mild pain or headache.      albuterol (VENTOLIN  HFA) 108 (90 Base) MCG/ACT inhaler Inhale 2 puffs into the lungs every 4 (four) hours as needed for shortness of breath.      amLODipine (NORVASC) 5 MG tablet Take 1 tablet (5 mg total) by mouth daily. 90 tablet 3    Calcium Carbonate (CALCIUM 600 PO) Take 600 mg by mouth daily.      clopidogrel (PLAVIX) 75 MG tablet Take 1 tablet (75 mg total) by mouth daily. 90 tablet 3    conjugated estrogens (PREMARIN) vaginal cream Place 0.5 Applicatorfuls vaginally 2 (two) times a week. On Tuesday and Saturday     Use as directed      denosumab (PROLIA) 60 MG/ML SOSY injection Inject 60 mg into the skin every 6 (six) months.      docusate sodium (COLACE) 50 MG capsule Take 50 mg by mouth daily as needed for mild constipation.      escitalopram (LEXAPRO) 10 MG tablet Take 10 mg by mouth daily.      fluticasone (FLONASE) 50 MCG/ACT nasal spray Place 1 spray into both nostrils 2 (two) times daily.      Fluticasone Furoate (ARNUITY ELLIPTA) 200 MCG/ACT AEPB Inhale 1 puff into the lungs daily. 30 each 5    irbesartan (AVAPRO) 150 MG tablet Take 1 tablet (150 mg total) by mouth daily. 90 tablet 3    ketotifen (ZADITOR) 0.025 % ophthalmic solution Place 1 drop into both eyes 2 (two) times daily.      loratadine (CLARITIN) 10 MG tablet Take 10 mg by mouth daily.      metroNIDAZOLE (METROGEL) 0.75 % gel Apply 1 Application topically daily.      nebivolol (BYSTOLIC) 5 MG tablet Take 1 tablet (5 mg total) by mouth daily. 90 tablet 3    nitroGLYCERIN (NITROSTAT) 0.4 MG SL tablet Place 0.4 mg under the tongue every 5 (five) minutes as needed for chest pain.      pantoprazole (PROTONIX) 40 MG tablet TAKE ONE TABLET BY MOUTH ONCE DAILY 90 tablet 3    polyvinyl alcohol (LIQUIFILM TEARS) 1.4 % ophthalmic solution Place 1 drop into both eyes 2 (two) times daily.      potassium chloride SA (KLOR-CON M20) 20 MEQ tablet Take 1 tablet (20 mEq total) by mouth daily. 90 tablet 3    rosuvastatin (CRESTOR) 5 MG tablet Take 1 tablet  (5 mg total) by mouth daily. 30 tablet 6    Simethicone (GAS-X MAXIMUM STRENGTH PO) Take 250 mg by mouth daily as needed (gas).      spironolactone (ALDACTONE) 25 MG tablet Take 1 tablet (25 mg total) by mouth daily. 90 tablet 3    Allergies  Allergen Reactions   Clindamycin Diarrhea and Nausea And Vomiting    Blood in diarrhea   Asa [Aspirin] Other (See Comments)    GI bleed   Codeine Other (See Comments)    headache   Lisinopril Cough   Simvastatin Diarrhea   Cephalexin Other (See Comments) and Rash  Social History   Tobacco Use   Smoking status: Former    Types: Cigarettes    Quit date: 08/07/1966    Years since quitting: 56.7   Smokeless tobacco: Never  Substance Use Topics   Alcohol use: Not Currently    Comment: 2 glasses wina a year    Family History  Problem Relation Age of Onset   Breast cancer Mother    Breast cancer Sister    Heart disease Maternal Grandmother    Asthma Daughter    Spina bifida Other    Colon cancer Neg Hx    Esophageal cancer Neg Hx    Rectal cancer Neg Hx    Stomach cancer Neg Hx      Review of Systems  Constitutional:  Negative for chills and fever.  Respiratory:  Negative for cough and shortness of breath.   Cardiovascular:  Negative for chest pain.  Gastrointestinal:  Negative for nausea and vomiting.  Musculoskeletal:  Positive for arthralgias.     Objective:  Physical Exam Well nourished and well developed. General: Alert and oriented x3, cooperative and pleasant, no acute distress. Head: normocephalic, atraumatic, neck supple. Eyes: EOMI.  Musculoskeletal: Left Knee: No erythema, warmth, or effusion AROM 0-120 Pain with full ROM Tender to palpation about the joint lines  Calves soft and nontender. Motor function intact in LE. Strength 5/5 LE bilaterally. Neuro: Distal pulses 2+. Sensation to light touch intact in LE.  Vital signs in last 24 hours:    Labs:   Estimated body mass index is 26.17 kg/m as  calculated from the following:   Height as of 04/11/23: 5' (1.524 m).   Weight as of 04/11/23: 60.8 kg.   Imaging Review Plain radiographs demonstrate severe degenerative joint disease of the left knee(s). The overall alignment isneutral. The bone quality appears to be adequate for age and reported activity level.      Assessment/Plan:  End stage arthritis, left knee   The patient history, physical examination, clinical judgment of the provider and imaging studies are consistent with end stage degenerative joint disease of the left knee(s) and total knee arthroplasty is deemed medically necessary. The treatment options including medical management, injection therapy arthroscopy and arthroplasty were discussed at length. The risks and benefits of total knee arthroplasty were presented and reviewed. The risks due to aseptic loosening, infection, stiffness, patella tracking problems, thromboembolic complications and other imponderables were discussed. The patient acknowledged the explanation, agreed to proceed with the plan and consent was signed. Patient is being admitted for inpatient treatment for surgery, pain control, PT, OT, prophylactic antibiotics, VTE prophylaxis, progressive ambulation and ADL's and discharge planning. The patient is planning to be discharged  home.  Therapy Plans: outpatient therapy at [facility] Disposition: Home with son Planned DVT Prophylaxis: Plavix DME needed: none PCP: Dr. Corliss Blacker, clearance received Cardio: Dr. Elease Hashimoto, clearance received TXA: IV Allergies: clindamycin - vomiting, keflex - rash on her face, lisinopril - cough, simvastatin - diarrhea Anesthesia Concerns: none BMI: 25.3 Last HgbA1c: Not diabetic   Other: - No hx of VTE or cancer - oxycodone, tylenol, robaxin   Patient's anticipated LOS is less than 2 midnights, meeting these requirements: - Younger than 74 - Lives within 1 hour of care - Has a competent adult at home to recover with post-op  recover - NO history of  - Chronic pain requiring opiods  - Diabetes  - Coronary Artery Disease  - Heart failure  - Heart attack  - Stroke  - DVT/VTE  -  Cardiac arrhythmia  - Respiratory Failure/COPD  - Renal failure  - Anemia  - Advanced Liver disease  Rosalene Billings, PA-C Orthopedic Surgery [facility] [place] [phone]

## 2023-04-30 NOTE — Discharge Instructions (Signed)

## 2023-04-30 NOTE — Evaluation (Signed)
Physical Therapy Evaluation Patient Details Name: [name] [name] MRN: [id number] DOB: 09-08-1946 Today's Date: 04/30/2023  History of Present Illness  77 yo female presents to therapy s/p L TKA on 05/01/2023 due to failure of conservative measures. Pt has PMH including but not limited to: anemia, GI bleed, HDL, HTN, diverticulosis, CAD, asthma, Ba Ca s/p B mastectomy, IBS, and MI.  Clinical Impression      [name] [name] is a 77 y.o. female POD 0 s/p L TKA. Patient reports mod I with mobility at baseline. Patient is now limited by functional impairments (see PT problem list below) and requires min guard for bed mobility and min guard for transfers. Patient was able to ambulate 50 feet with RW and min guard level of assist. Patient instructed in exercise to facilitate ROM and circulation to manage edema. Patient will benefit from continued skilled PT interventions to address impairments and progress towards PLOF. Acute PT will follow to progress mobility and stair training in preparation for safe discharge home with family support and reports OPPT.    Recommendations for follow up therapy are one component of a multi-disciplinary discharge planning process, led by the attending physician.  Recommendations may be updated based on patient status, additional functional criteria and insurance authorization.  Follow Up Recommendations       Assistance Recommended at Discharge Intermittent Supervision/Assistance  Patient can return home with the following  A little help with walking and/or transfers;A little help with bathing/dressing/bathroom;Assistance with cooking/housework;Assist for transportation;Help with stairs or ramp for entrance    Equipment Recommendations None recommended by PT (pt reports DME in home setting)  Recommendations for Other Services       Functional Status Assessment Patient has had a recent decline in their functional status and demonstrates the ability to make  significant improvements in function in a reasonable and predictable amount of time.     Precautions / Restrictions Precautions Precautions: Fall;Knee Restrictions Weight Bearing Restrictions: No      Mobility  Bed Mobility Overal bed mobility: Needs Assistance Bed Mobility: Supine to Sit     Supine to sit: Min guard     General bed mobility comments: HOB elevated, incresaed time and use of bed rails    Transfers Overall transfer level: Needs assistance   Transfers: Sit to/from Stand Sit to Stand: Min guard           General transfer comment: cues for proper UE placement    Ambulation/Gait Ambulation/Gait assistance: Min guard Gait Distance (Feet): 50 Feet Assistive device: Rolling walker (2 wheels) Gait Pattern/deviations: Step-to pattern, Decreased stance time - left, Antalgic Gait velocity: decreased     General Gait Details: pt indicated pain with LLE WB increasing to 7/10  Stairs            Wheelchair Mobility    Modified Rankin (Stroke Patients Only)       Balance Overall balance assessment: Needs assistance Sitting-balance support: Feet supported Sitting balance-Leahy Scale: Good     Standing balance support: Bilateral upper extremity supported, During functional activity Standing balance-Leahy Scale: Poor                               Pertinent Vitals/Pain Pain Assessment Pain Assessment: 0-10 Pain Score: 7  Pain Location: L knee Pain Descriptors / Indicators: Discomfort, Operative site guarding Pain Intervention(s): Limited activity within patient's tolerance, Monitored during session, Premedicated before session, Repositioned, Ice applied  Home Living Family/patient expects to be discharged to:: Private residence Living Arrangements: Alone (son will stay with pt 24/7 at initial d/c) Available Help at Discharge: Family Type of Home: House Home Access: Stairs to enter Entrance Stairs-Rails: None Entrance  Stairs-Number of Steps: 1   Home Layout: One level Home Equipment: Agricultural consultant (2 wheels);Cane - single point;Tub bench      Prior Function Prior Level of Function : Independent/Modified Independent             Mobility Comments: SPC for community mobility mod I for ADLs, self care tasks, IADLs, driving       Hand Dominance        Extremity/Trunk Assessment        Lower Extremity Assessment Lower Extremity Assessment: LLE deficits/detail LLE Deficits / Details: ankle DF/PF 5/5; SLR < 10 degree lag LLE Sensation: decreased light touch (L LE mumbness on plantar surface of foot)    Cervical / Trunk Assessment Cervical / Trunk Assessment:  (wfl)  Communication   Communication: No difficulties  Cognition Arousal/Alertness: Awake/alert Behavior During Therapy: WFL for tasks assessed/performed Overall Cognitive Status: Within Functional Limits for tasks assessed                                          General Comments      Exercises Total Joint Exercises Ankle Circles/Pumps: AROM, Both, 20 reps   Assessment/Plan    PT Assessment Patient needs continued PT services  PT Problem List Decreased strength;Decreased range of motion;Decreased activity tolerance;Decreased balance;Decreased mobility;Pain       PT Treatment Interventions DME instruction;Gait training;Stair training;Functional mobility training;Therapeutic activities;Therapeutic exercise;Balance training;Neuromuscular re-education;Patient/family education;Modalities    PT Goals (Current goals can be found in the Care Plan section)  Acute Rehab PT Goals Patient Stated Goal: to be able to garden again, walk to the grocery store without a cane and return to walking for exercise PT Goal Formulation: With patient Time For Goal Achievement: 05/14/23 Potential to Achieve Goals: Good    Frequency 7X/week     Co-evaluation               AM-PAC PT "6 Clicks" Mobility  Outcome  Measure Help needed turning from your back to your side while in a flat bed without using bedrails?: A Little Help needed moving from lying on your back to sitting on the side of a flat bed without using bedrails?: A Little Help needed moving to and from a bed to a chair (including a wheelchair)?: A Little Help needed standing up from a chair using your arms (e.g., wheelchair or bedside chair)?: A Little Help needed to walk in hospital room?: A Little Help needed climbing 3-5 steps with a railing? : A Lot 6 Click Score: 17    End of Session Equipment Utilized During Treatment: Gait belt Activity Tolerance: Patient limited by pain Patient left: in chair;with call bell/phone within reach Nurse Communication: Mobility status PT Visit Diagnosis: Unsteadiness on feet (R26.81);Other abnormalities of gait and mobility (R26.89);Muscle weakness (generalized) (M62.81);Pain Pain - Right/Left: Left Pain - part of body: Knee;Leg    Time: 0981-1914 PT Time Calculation (min) (ACUTE ONLY): 24 min   Charges:   PT Evaluation $PT Eval Low Complexity: 1 Low PT Treatments $Gait Training: 8-22 mins        Johnny Bridge, PT Acute Rehab   DARLIS FOLLETT 04/30/2023, 5:44 PM

## 2023-04-30 NOTE — Anesthesia Postprocedure Evaluation (Signed)
Anesthesia Post Note  Patient: [name] [name]  Procedure(s) Performed: TOTAL KNEE ARTHROPLASTY (Left: Knee)     Patient location during evaluation: PACU Anesthesia Type: MAC Level of consciousness: awake and alert Pain management: pain level controlled Vital Signs Assessment: post-procedure vital signs reviewed and stable Respiratory status: spontaneous breathing, nonlabored ventilation and respiratory function stable Cardiovascular status: blood pressure returned to baseline and stable Postop Assessment: no apparent nausea or vomiting Anesthetic complications: no   No notable events documented.  Last Vitals:  Vitals:   04/30/23 1445 04/30/23 1502  BP: 120/65 (!) 109/58  Pulse: 69 (!) 57  Resp: 15 16  Temp: 36.5 C 36.6 C  SpO2: 96% 92%    Last Pain:  Vitals:   04/30/23 1606  TempSrc:   PainSc: 5                  Lowella Curb

## 2023-04-30 NOTE — Interval H&P Note (Signed)
History and Physical Interval Note:  04/30/2023 10:14 AM  [name] [name]  has presented today for surgery, with the diagnosis of Left knee osteoarthritis.  The various methods of treatment have been discussed with the patient and family. After consideration of risks, benefits and other options for treatment, the patient has consented to  Procedure(s): TOTAL KNEE ARTHROPLASTY (Left) as a surgical intervention.  The patient's history has been reviewed, patient examined, no change in status, stable for surgery.  I have reviewed the patient's chart and labs.  Questions were answered to the patient's satisfaction.     Shelda Pal

## 2023-04-30 NOTE — Care Plan (Signed)
Ortho Bundle Case Management Note  Patient Details  Name: [name] [name] MRN: [id number] Date of Birth: 29-Mar-1946                  L TKA on 04-30-23  DCP: Home with son  DME: No needs, has a standard walker with slides on all 4 posts  PT: [facility] 05-03-23   DME Arranged:  N/A DME Agency:      Additional Comments: Please contact me with any questions of if this plan should need to change.  Ennis Forts, RN,CCM [facility]  [phone] 04/30/2023, 10:53 AM

## 2023-04-30 NOTE — Anesthesia Procedure Notes (Signed)
Anesthesia Regional Block: Adductor canal block   Pre-Anesthetic Checklist: , timeout performed,  Correct Patient, Correct Site, Correct Laterality,  Correct Procedure, Correct Position, site marked,  Risks and benefits discussed,  Surgical consent,  Pre-op evaluation,  At surgeon's request and post-op pain management  Laterality: Left  Prep: chloraprep       Needles:  Injection technique: Single-shot  Needle Type: Stimiplex     Needle Length: 9cm  Needle Gauge: 21     Additional Needles:   Procedures:,,,, ultrasound used (permanent image in chart),,    Narrative:  Start time: 04/30/2023 10:39 AM End time: 04/30/2023 10:44 AM Injection made incrementally with aspirations every 5 mL.  Performed by: Personally  Anesthesiologist: Lowella Curb, MD

## 2023-04-30 NOTE — Transfer of Care (Signed)
Immediate Anesthesia Transfer of Care Note  Patient: [name] [name]  Procedure(s) Performed: TOTAL KNEE ARTHROPLASTY (Left: Knee)  Patient Location: PACU  Anesthesia Type:Spinal  Level of Consciousness: sedated, patient cooperative, and responds to stimulation  Airway & Oxygen Therapy: Patient Spontanous Breathing and Patient connected to face mask oxygen  Post-op Assessment: Report given to RN and Post -op Vital signs reviewed and stable  Post vital signs: Reviewed and stable  Last Vitals:  Vitals Value Taken Time  BP 89/75 04/30/23 1330  Temp    Pulse 65 04/30/23 1331  Resp 21 04/30/23 1331  SpO2 99 % 04/30/23 1331  Vitals shown include unvalidated device data.  Last Pain:  Vitals:   04/30/23 1130  TempSrc:   PainSc: 0-No pain      Patients Stated Pain Goal: 4 (04/30/23 0919)  Complications: No notable events documented.

## 2023-04-30 NOTE — Op Note (Signed)
NAME:  [name] [name]                      MEDICAL RECORD NO.:  [id number]                             FACILITY:  [facility]      PHYSICIAN:  Madlyn Frankel. Charlann Boxer, M.D.  DATE OF BIRTH:  March 12, 1946      DATE OF PROCEDURE:  04/30/2023                                     OPERATIVE REPORT         PREOPERATIVE DIAGNOSIS:  Left knee osteoarthritis.      POSTOPERATIVE DIAGNOSIS:  Left knee osteoarthritis.      FINDINGS:  The patient was noted to have complete loss of cartilage and   bone-on-bone arthritis with associated osteophytes in the medial and patellofemoral compartments of   the knee with a significant synovitis and associated effusion.  The patient had failed months of conservative treatment including medications, injection therapy, activity modification.     PROCEDURE:  Left total knee replacement.      COMPONENTS USED:  DePuy Attune FB CR MS knee   system, a size 4N femur, 3 tibia, size 12 mm FB CR MS AOX insert, and 32 anatomic patellar   button.      SURGEON:  Madlyn Frankel. Charlann Boxer, M.D.      ASSISTANT:  Rosalene Billings, PA-C.      ANESTHESIA:  Regional and Spinal.      SPECIMENS:  None.      COMPLICATION:  None.      DRAINS:  None.  EBL: <100 cc      TOURNIQUET TIME:  28 min at 225 mmHg     The patient was stable to the recovery room.      INDICATION FOR PROCEDURE:  [name] [name] is a 77 y.o. female patient of   mine.  The patient had been seen, evaluated, and treated for months conservatively in the   office with medication, activity modification, and injections.  The patient had   radiographic changes of bone-on-bone arthritis with endplate sclerosis and osteophytes noted.  Based on the radiographic changes and failed conservative measures, the patient   decided to proceed with definitive treatment, total knee replacement.  Risks of infection, DVT, component failure, need for revision surgery, neurovascular injury were reviewed in the office setting.  The postop course was  reviewed stressing the efforts to maximize post-operative satisfaction and function.  Consent was obtained for benefit of pain   relief.      PROCEDURE IN DETAIL:  The patient was brought to the operative theater.   Once adequate anesthesia, preoperative antibiotics, 2 gm of Ancef,1 gm of Tranexamic Acid, and 10 mg of Decadron administered, the patient was positioned supine with a left thigh tourniquet placed.  The  left lower extremity was prepped and draped in sterile fashion.  A time-   out was performed identifying the patient, planned procedure, and the appropriate extremity.      The left lower extremity was placed in the [facility] leg holder.  The leg was   exsanguinated, tourniquet elevated to 225 mmHg.  A midline incision was   made followed by median parapatellar arthrotomy.  Following initial   exposure, attention  was first directed to the patella.  Precut   measurement was noted to be 26 mm.  I resected down to 14 mm and used a   32 anatomic patellar button to restore patellar height as well as cover the cut surface.      The lug holes were drilled and a metal shim was placed to protect the   patella from retractors and saw blade during the procedure.      At this point, attention was now directed to the femur.  The femoral   canal was opened with a drill, irrigated to try to prevent fat emboli.  An   intramedullary rod was passed at 3 degrees valgus, 9 mm of bone was   resected off the distal femur.  Following this resection, the tibia was   subluxated anteriorly.  Using the extramedullary guide, 2 mm of bone was resected off   the proximal medial tibia.  We confirmed the gap would be   stable medially and laterally with a size 8 spacer block as well as confirmed that the tibial cut was perpendicular in the coronal plane, checking with an alignment rod.      Once this was done, I sized the femur to be a size 4 in the anterior-   posterior dimension, chose a narrow component based  on medial and   lateral dimension.  The size 4 rotation block was then pinned in   position anterior referenced using the C-clamp to set rotation.  The   anterior, posterior, and  chamfer cuts were made without difficulty nor   notching making certain that I was along the anterior cortex to help   with flexion gap stability.      The final shim cut was made off the lateral aspect of distal femur.      At this point, the tibia was sized to be a size 3.  The size 3 tray was   then pinned in position through the medial third of the tubercle,   drilled, and keel punched.  Trial reduction was now carried with a 4 femur,  3 tibia, a 12 mm PS insert, and the 32 anatomic patella botton.  The knee was brought to full extension with good flexion stability with the patella   tracking through the trochlea without application of pressure.  Given   all these findings the trial components removed.  Final components were   opened and cement was mixed.  The knee was irrigated with normal saline solution and pulse lavage.  The synovial lining was   then injected with 30 cc of 0.25% Marcaine with epinephrine, 1 cc of Toradol and 30 cc of NS for a total of 61 cc.     Final implants were then cemented onto cleaned and dried cut surfaces of bone with the knee brought to extension with a size 12 mm CR MS trial insert.      Once the cement had fully cured, excess cement was removed   throughout the knee.  I confirmed that I was satisfied with the range of   motion and stability, and the final size 12 mm FB CR MS AOX insert was chosen.  It was   placed into the knee.      The tourniquet had been let down at 28 minutes.  No significant   hemostasis was required.  The extensor mechanism was then reapproximated using #1 Vicryl and #1 Stratafix sutures with the knee   in flexion.  The   remaining wound was closed with 2-0 Vicryl and running 4-0 Monocryl.   The knee was cleaned, dried, dressed sterilely using  Dermabond and   Aquacel dressing.  The patient was then   brought to recovery room in stable condition, tolerating the procedure   well.   Please note that Physician Assistant, Rosalene Billings, PA-C was present for the entirety of the case, and was utilized for pre-operative positioning, peri-operative retractor management, general facilitation of the procedure and for primary wound closure at the end of the case.              Madlyn Frankel Charlann Boxer, M.D.    04/30/2023 11:53 AM

## 2023-04-30 NOTE — Anesthesia Procedure Notes (Signed)
Spinal  Start time: 04/30/2023 11:50 AM End time: 04/30/2023 11:56 AM Staffing Performed: resident/CRNA  Resident/CRNA: Kizzie Fantasia, CRNA Performed by: Kizzie Fantasia, CRNA Authorized by: Lowella Curb, MD   Preanesthetic Checklist Completed: patient identified, IV checked, site marked, risks and benefits discussed, surgical consent, monitors and equipment checked, pre-op evaluation and timeout performed Spinal Block Patient position: sitting Prep: DuraPrep Patient monitoring: heart rate, continuous pulse ox and blood pressure Location: L3-4 Injection technique: single-shot Needle Needle type: Pencan  Needle gauge: 24 G Needle length: 9 cm Needle insertion depth: 6 cm Assessment Events: CSF return

## 2023-05-01 DIAGNOSIS — Z7902 Long term (current) use of antithrombotics/antiplatelets: Secondary | ICD-10-CM | POA: Diagnosis not present

## 2023-05-01 DIAGNOSIS — M1711 Unilateral primary osteoarthritis, right knee: Secondary | ICD-10-CM | POA: Diagnosis not present

## 2023-05-01 DIAGNOSIS — Z7952 Long term (current) use of systemic steroids: Secondary | ICD-10-CM | POA: Diagnosis not present

## 2023-05-01 DIAGNOSIS — I2510 Atherosclerotic heart disease of native coronary artery without angina pectoris: Secondary | ICD-10-CM | POA: Diagnosis not present

## 2023-05-01 DIAGNOSIS — Z853 Personal history of malignant neoplasm of breast: Secondary | ICD-10-CM | POA: Diagnosis not present

## 2023-05-01 DIAGNOSIS — Z79899 Other long term (current) drug therapy: Secondary | ICD-10-CM | POA: Diagnosis not present

## 2023-05-01 DIAGNOSIS — Z87891 Personal history of nicotine dependence: Secondary | ICD-10-CM | POA: Diagnosis not present

## 2023-05-01 DIAGNOSIS — J45909 Unspecified asthma, uncomplicated: Secondary | ICD-10-CM | POA: Diagnosis not present

## 2023-05-01 DIAGNOSIS — I10 Essential (primary) hypertension: Secondary | ICD-10-CM | POA: Diagnosis not present

## 2023-05-01 LAB — CBC
HCT: 32.8 % — ABNORMAL LOW (ref 36.0–46.0)
Hemoglobin: 10.5 g/dL — ABNORMAL LOW (ref 12.0–15.0)
MCH: 31.9 pg (ref 26.0–34.0)
MCHC: 32 g/dL (ref 30.0–36.0)
MCV: 99.7 fL (ref 80.0–100.0)
Platelets: 216 10*3/uL (ref 150–400)
RBC: 3.29 MIL/uL — ABNORMAL LOW (ref 3.87–5.11)
RDW: 13.7 % (ref 11.5–15.5)
WBC: 13.5 10*3/uL — ABNORMAL HIGH (ref 4.0–10.5)
nRBC: 0 % (ref 0.0–0.2)

## 2023-05-01 LAB — BASIC METABOLIC PANEL
Anion gap: 4 — ABNORMAL LOW (ref 5–15)
BUN: 18 mg/dL (ref 8–23)
CO2: 22 mmol/L (ref 22–32)
Calcium: 7.2 mg/dL — ABNORMAL LOW (ref 8.9–10.3)
Chloride: 110 mmol/L (ref 98–111)
Creatinine, Ser: 1 mg/dL (ref 0.44–1.00)
GFR, Estimated: 58 mL/min — ABNORMAL LOW (ref >60)
Glucose, Bld: 198 mg/dL — ABNORMAL HIGH (ref 70–99)
Potassium: 4.9 mmol/L (ref 3.5–5.1)
Sodium: 136 mmol/L (ref 135–145)

## 2023-05-01 MED ORDER — OXYCODONE HCL 5 MG PO TABS: 5.0000 mg | ORAL_TABLET | ORAL | 0 refills | Status: DC | PRN

## 2023-05-01 MED ORDER — POLYETHYLENE GLYCOL 3350 17 G PO PACK: 17.0000 g | PACK | Freq: Two times a day (BID) | ORAL | 0 refills | Status: DC

## 2023-05-01 MED ORDER — SENNA 8.6 MG PO TABS: 2.0000 | ORAL_TABLET | Freq: Every day | ORAL | 0 refills | Status: AC

## 2023-05-01 MED ORDER — METHOCARBAMOL 500 MG PO TABS: 500.0000 mg | ORAL_TABLET | Freq: Four times a day (QID) | ORAL | 2 refills | Status: DC | PRN

## 2023-05-01 NOTE — Progress Notes (Signed)
Physical Therapy Treatment Patient Details Name: [name] [name] MRN: [id number] DOB: 09/01/1946 Today's Date: 05/01/2023   History of Present Illness 77 yo female presents to therapy s/p L TKA on 05/01/2023 due to failure of conservative measures. Pt has PMH including but not limited to: anemia, GI bleed, HDL, HTN, diverticulosis, CAD, asthma, Ba Ca s/p B mastectomy, IBS, and MI.    PT Comments    Pt ambulated 130' with RW, no loss of balance. Stair training completed. Pt demonstrates good understanding of HEP. She is ready to DC home from a PT standpoint.    Recommendations for follow up therapy are one component of a multi-disciplinary discharge planning process, led by the attending physician.  Recommendations may be updated based on patient status, additional functional criteria and insurance authorization.  Follow Up Recommendations       Assistance Recommended at Discharge Intermittent Supervision/Assistance  Patient can return home with the following A little help with walking and/or transfers;A little help with bathing/dressing/bathroom;Assistance with cooking/housework;Assist for transportation;Help with stairs or ramp for entrance   Equipment Recommendations  None recommended by PT (pt reports DME in home setting)    Recommendations for Other Services       Precautions / Restrictions Precautions Precautions: Fall;Knee Precaution Booklet Issued: Yes (comment) Precaution Comments: reviewed no pillow under knee Restrictions Weight Bearing Restrictions: No LLE Weight Bearing: Weight bearing as tolerated     Mobility  Bed Mobility Overal bed mobility: Needs Assistance Bed Mobility: Supine to Sit     Supine to sit: Supervision, HOB elevated     General bed mobility comments: HOB elevated, incresaed time and use of bed rails    Transfers Overall transfer level: Needs assistance   Transfers: Sit to/from Stand Sit to Stand: Supervision           General  transfer comment: cues for proper UE placement    Ambulation/Gait Ambulation/Gait assistance: Supervision Gait Distance (Feet): 130 Feet Assistive device: Rolling walker (2 wheels) Gait Pattern/deviations: Step-to pattern, Decreased stance time - left, Antalgic Gait velocity: decreased     General Gait Details: pt indicated pain with LLE WB increasing to 7/10   Stairs Stairs: Yes Stairs assistance: Min assist Stair Management: No rails, Step to pattern, Backwards, With walker Number of Stairs: 1 General stair comments: 1 step x 2 trials, min A to steady RW, VCs sequencing   Wheelchair Mobility    Modified Rankin (Stroke Patients Only)       Balance Overall balance assessment: Needs assistance Sitting-balance support: Feet supported Sitting balance-Leahy Scale: Good     Standing balance support: Bilateral upper extremity supported, During functional activity Standing balance-Leahy Scale: Poor                              Cognition Arousal/Alertness: Awake/alert Behavior During Therapy: WFL for tasks assessed/performed Overall Cognitive Status: Within Functional Limits for tasks assessed                                          Exercises Total Joint Exercises Ankle Circles/Pumps: AROM, Both, 20 reps Quad Sets: AROM, Both, 5 reps, Supine Short Arc Quad: AROM, Left, 5 reps, Supine Heel Slides: AAROM, Left, 5 reps, Supine Hip ABduction/ADduction: AAROM, Left, 5 reps, Supine Straight Leg Raises: AROM, Left, 5 reps, Supine Long Arc Quad: AROM, Left, 5 reps,  Seated Knee Flexion: AAROM, Left, 5 reps, Seated Goniometric ROM: 0-85* AAROM L knee    General Comments        Pertinent Vitals/Pain Pain Assessment Pain Location: L knee Pain Descriptors / Indicators: Discomfort, Operative site guarding Pain Intervention(s): Limited activity within patient's tolerance, Monitored during session, Premedicated before session, Ice applied     Home Living                          Prior Function            PT Goals (current goals can now be found in the care plan section) Acute Rehab PT Goals Patient Stated Goal: to be able to garden again, walk to the grocery store without a cane and return to walking for exercise PT Goal Formulation: With patient Time For Goal Achievement: 05/14/23 Potential to Achieve Goals: Good Progress towards PT goals: Progressing toward goals    Frequency    7X/week      PT Plan Current plan remains appropriate    Co-evaluation              AM-PAC PT "6 Clicks" Mobility   Outcome Measure  Help needed turning from your back to your side while in a flat bed without using bedrails?: A Little Help needed moving from lying on your back to sitting on the side of a flat bed without using bedrails?: A Little Help needed moving to and from a bed to a chair (including a wheelchair)?: None Help needed standing up from a chair using your arms (e.g., wheelchair or bedside chair)?: None Help needed to walk in hospital room?: None Help needed climbing 3-5 steps with a railing? : A Little 6 Click Score: 21    End of Session Equipment Utilized During Treatment: Gait belt Activity Tolerance: Patient limited by pain Patient left: in chair;with call bell/phone within reach;with chair alarm set Nurse Communication: Mobility status PT Visit Diagnosis: Unsteadiness on feet (R26.81);Other abnormalities of gait and mobility (R26.89);Muscle weakness (generalized) (M62.81);Pain Pain - Right/Left: Left Pain - part of body: Knee;Leg     Time: 1610-9604 PT Time Calculation (min) (ACUTE ONLY): 34 min  Charges:  $Gait Training: 8-22 mins $Therapeutic Exercise: 8-22 mins                     Ralene Bathe Kistler PT 05/01/2023  Acute Rehabilitation Services  Office [phone]

## 2023-05-01 NOTE — TOC Transition Note (Signed)
Transition of Care [facility]) - CM/SW Discharge Note  Patient Details  Name: [name] [name] MRN: [id number] Date of Birth: November 10, 1946  [facility] CM/SW Contact:  Ewing Schlein, LCSW Phone Number: 05/01/2023, 10:28 AM  Clinical Narrative: Patient is expected to discharge home after working with PT. CSW met with patient to confirm discharge plan. Patient will go home with OPPT at [facility] with the first appointment scheduled on 05/03/23. Patient has a walker at home, so there are no DME needs at this time. TOC signing off.  Final next level of care: OP Rehab Barriers to Discharge: No Barriers Identified  Patient Goals and CMS Choice Choice offered to / list presented to : NA  Discharge Plan and Services Additional resources added to the After Visit Summary for          DME Arranged: N/A DME Agency: NA  Social Determinants of Health (SDOH) Interventions SDOH Screenings   Food Insecurity: No Food Insecurity (04/30/2023)  Housing: Low Risk  (04/30/2023)  Transportation Needs: No Transportation Needs (04/30/2023)  Utilities: Not At Risk (04/30/2023)  Tobacco Use: Medium Risk (04/30/2023)   Readmission Risk Interventions     No data to display

## 2023-05-01 NOTE — Progress Notes (Addendum)
Subjective: 1 Day Post-Op Procedure(s) (LRB): TOTAL KNEE ARTHROPLASTY (Left) Patient reports pain as mild.   Patient seen in rounds with Dr. Charlann Boxer. Patient is well, and has had no acute complaints or problems. No acute events overnight. Foley catheter removed. Patient ambulated 50 feet with PT.  We will start therapy today.   Objective: Vital signs in last 24 hours: Temp:  [96.6 F (35.9 C)-97.8 F (36.6 C)] 97.8 F (36.6 C) (05/29 0551) Pulse Rate:  [54-69] 56 (05/29 0551) Resp:  [11-21] 16 (05/29 0551) BP: (101-165)/(48-73) 101/48 (05/29 0551) SpO2:  [90 %-100 %] 96 % (05/29 0551) Weight:  [60.8 kg] 60.8 kg (05/28 0919)  Intake/Output from previous day:  Intake/Output Summary (Last 24 hours) at 05/01/2023 0748 Last data filed at 05/01/2023 0600 Gross per 24 hour  Intake 3232.5 ml  Output 1350 ml  Net 1882.5 ml     Intake/Output this shift: No intake/output data recorded.  Labs: Recent Labs    05/01/23 0339  HGB 10.5*   Recent Labs    05/01/23 0339  WBC 13.5*  RBC 3.29*  HCT 32.8*  PLT 216   Recent Labs    05/01/23 0339  NA 136  K 4.9  CL 110  CO2 22  BUN 18  CREATININE 1.00  GLUCOSE 198*  CALCIUM 7.2*   No results for input(s): "LABPT", "INR" in the last 72 hours.  Exam: General - Patient is Alert and Oriented Extremity - Neurologically intact Sensation intact distally Intact pulses distally Dorsiflexion/Plantar flexion intact Dressing - dressing C/D/I Motor Function - intact, moving foot and toes well on exam.   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 NSTEMI 8/04 => LHC 07/2003:  mLAD hazy 75% => Taxus DES, EF 30% with ant-apical, apical and inf-apical AK.  b.  Echo 4/05: EF 60%.  c.  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   Weight as of this encounter: 60.8 kg. Advance diet Up with therapy D/C IV fluids   Patient's anticipated LOS is less than 2 midnights, meeting these requirements: - Younger than 68 - Lives within 1 hour of care - Has a competent adult at home to recover with post-op recover - NO history of  - Chronic pain requiring opiods  - Diabetes  -  Coronary Artery Disease  - Heart failure  - Heart attack  - Stroke  - DVT/VTE  - Cardiac arrhythmia  - Respiratory Failure/COPD  - Renal failure  - Anemia  - Advanced Liver disease     DVT Prophylaxis - Plavix Weight bearing as tolerated.  Hgb stable at 10.5 this AM.  Plan is to go Home after hospital stay. Plan for discharge today following 1-2 sessions of PT as long as they are meeting their goals. Patient is scheduled for OPPT. Follow up in the office in 2 weeks.   Dennie Bible, PA-C Orthopedic Surgery [phone] 05/01/2023, 7:48 AM

## 2023-05-01 NOTE — Plan of Care (Signed)

## 2023-05-02 ENCOUNTER — Encounter (HOSPITAL_COMMUNITY): Payer: Self-pay | Admitting: Orthopedic Surgery

## 2023-05-03 DIAGNOSIS — M25562 Pain in left knee: Secondary | ICD-10-CM | POA: Diagnosis not present

## 2023-05-06 DIAGNOSIS — M25562 Pain in left knee: Secondary | ICD-10-CM | POA: Diagnosis not present

## 2023-05-06 NOTE — Discharge Summary (Signed)
Patient ID: BRIGHTLY DABU MRN: [id number] DOB/AGE: 79/25/1947 79 y.o.  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 NSTEMI 8/04 => LHC 07/2003:  mLAD hazy 75% => Taxus DES, EF 30% with ant-apical, apical and inf-apical AK.  b.  Echo 4/05: EF 60%.  c.  ETT/Lexiscan Myoview 06/2011: EF 80%, no ischemia, normal wall motion   Cataract    Diverticulosis 11/13/2004   Family history of adverse reaction to anesthesia    Sister PONV   GERD (gastroesophageal reflux disease)    Hyperplastic colon polyp 10/16/1999   Hypertension    IBS (irritable bowel syndrome)    Internal and external hemorrhoids without complication 11/13/2004   MI (myocardial infarction) (HCC) 12/03/2002   Anteroapical MI with PCI to the LAD   PONV (postoperative nausea and vomiting)    Positive PPD    PVC (premature ventricular contraction)    Tuberculosis    Many years ago tested positive just did CXR 's    Surgeries: Procedure(s): TOTAL KNEE ARTHROPLASTY on 04/30/2023   Consultants:   Discharged Condition: Improved  Hospital Course: [name] [name] is an 77 y.o. female who was admitted 04/30/2023 for operative treatment ofS/P total knee arthroplasty, left. Patient has severe unremitting pain that affects sleep, daily activities, and work/hobbies. After pre-op clearance the patient was taken to the operating room on 04/30/2023 and underwent  Procedure(s): TOTAL KNEE ARTHROPLASTY.    Patient was given perioperative antibiotics:  Anti-infectives (From admission, onward)    Start     Dose/Rate Route Frequency  Ordered Stop   04/30/23 1800  ceFAZolin (ANCEF) IVPB 2g/100 mL premix        2 g 200 mL/hr over 30 Minutes Intravenous Every 6 hours 04/30/23 1501 04/30/23 2338   04/30/23 1030  ceFAZolin (ANCEF) IVPB 2g/100 mL premix        2 g 200 mL/hr over 30 Minutes Intravenous  Once 04/30/23 1022 04/30/23 1202   04/30/23 0915  levofloxacin (LEVAQUIN) IVPB 500 mg  Status:  Discontinued        500 mg 100 mL/hr over 60 Minutes Intravenous On call to O.R. 04/30/23 0903 04/30/23 1021   04/30/23 0915  vancomycin (VANCOCIN) IVPB 1000 mg/200 mL premix  Status:  Discontinued        1,000 mg 200 mL/hr over 60 Minutes Intravenous On call to O.R. 04/30/23 4098 04/30/23 1021        Patient was given sequential compression devices, early ambulation, and chemoprophylaxis to prevent DVT. Patient worked with PT and was meeting their goals regarding safe ambulation and transfers.  Patient benefited maximally from hospital stay and there were no complications.    Recent vital signs: No data found.   Recent laboratory studies: No results for input(s): "WBC", "HGB", "HCT", "PLT", "NA", "K", "CL", "CO2", "BUN", "CREATININE", "GLUCOSE", "INR", "CALCIUM" in the last 72 hours.  Invalid input(s): "PT", "2"   Discharge Medications:   Allergies as of 05/01/2023       Reactions   Clindamycin Diarrhea, Nausea And Vomiting  Blood in diarrhea   Asa [aspirin] Other (See Comments)   GI bleed   Codeine Other (See Comments)   headache   Lisinopril Cough   Simvastatin Diarrhea   Cephalexin Other (See Comments), Rash        Medication List     TAKE these medications    acetaminophen 500 MG tablet Commonly known as: TYLENOL Take 500-1,000 mg by mouth every 6 (six) hours as needed for mild pain or headache.   albuterol 108 (90 Base) MCG/ACT inhaler Commonly known as: VENTOLIN HFA Inhale 2 puffs into the lungs every 4 (four) hours as needed for shortness of breath.   amLODipine 5 MG tablet Commonly known as:  NORVASC Take 1 tablet (5 mg total) by mouth daily.   Arnuity Ellipta 200 MCG/ACT Aepb Generic drug: Fluticasone Furoate Inhale 1 puff into the lungs daily. Notes to patient: Resume as prescribed   CALCIUM 600 PO Take 600 mg by mouth daily. Notes to patient: Resume as prescribed   clopidogrel 75 MG tablet Commonly known as: PLAVIX Take 1 tablet (75 mg total) by mouth daily.   conjugated estrogens vaginal cream Commonly known as: PREMARIN Place 0.5 Applicatorfuls vaginally 2 (two) times a week. On Tuesday and Saturday     Use as directed   denosumab 60 MG/ML Sosy injection Commonly known as: PROLIA Inject 60 mg into the skin every 6 (six) months.   docusate sodium 50 MG capsule Commonly known as: COLACE Take 50 mg by mouth daily as needed for mild constipation.   escitalopram 10 MG tablet Commonly known as: LEXAPRO Take 10 mg by mouth daily.   fluticasone 50 MCG/ACT nasal spray Commonly known as: FLONASE Place 1 spray into both nostrils 2 (two) times daily.   GAS-X MAXIMUM STRENGTH PO Take 250 mg by mouth daily as needed (gas).   irbesartan 150 MG tablet Commonly known as: Avapro Take 1 tablet (150 mg total) by mouth daily.   ketotifen 0.025 % ophthalmic solution Commonly known as: ZADITOR Place 1 drop into both eyes 2 (two) times daily.   loratadine 10 MG tablet Commonly known as: CLARITIN Take 10 mg by mouth daily.   methocarbamol 500 MG tablet Commonly known as: ROBAXIN Take 1 tablet (500 mg total) by mouth every 6 (six) hours as needed for muscle spasms (muscle pain).   metroNIDAZOLE 0.75 % gel Commonly known as: METROGEL Apply 1 Application topically daily.   nebivolol 5 MG tablet Commonly known as: Bystolic Take 1 tablet (5 mg total) by mouth daily.   nitroGLYCERIN 0.4 MG SL tablet Commonly known as: NITROSTAT Place 0.4 mg under the tongue every 5 (five) minutes as needed for chest pain.   oxyCODONE 5 MG immediate release tablet Commonly known  as: Oxy IR/ROXICODONE Take 1 tablet (5 mg total) by mouth every 4 (four) hours as needed for severe pain.   pantoprazole 40 MG tablet Commonly known as: PROTONIX TAKE ONE TABLET BY MOUTH ONCE DAILY   polyethylene glycol 17 g packet Commonly known as: MIRALAX / GLYCOLAX Take 17 g by mouth 2 (two) times daily.   polyvinyl alcohol 1.4 % ophthalmic solution Commonly known as: LIQUIFILM TEARS Place 1 drop into both eyes 2 (two) times daily.   potassium chloride SA 20 MEQ tablet Commonly known as: Klor-Con M20 Take 1 tablet (20 mEq total) by mouth daily.   rosuvastatin 5 MG tablet Commonly known as: CRESTOR Take 1 tablet (5 mg total) by mouth daily.   senna 8.6 MG Tabs  tablet Commonly known as: SENOKOT Take 2 tablets (17.2 mg total) by mouth at bedtime for 14 days.   spironolactone 25 MG tablet Commonly known as: ALDACTONE Take 1 tablet (25 mg total) by mouth daily.               Discharge Care Instructions  (From admission, onward)           Start     Ordered   05/01/23 0000  Change dressing       Comments: Maintain surgical dressing until follow up in the clinic. If the edges start to pull up, may reinforce with tape. If the dressing is no longer working, may remove and cover with gauze and tape, but must keep the area dry and clean.  Call with any questions or concerns.   05/01/23 0751            Diagnostic Studies: No results found.  Disposition: Discharge disposition: 01-Home or Self Care       Discharge Instructions     Call MD / Call 911   Complete by: As directed    If you experience chest pain or shortness of breath, CALL 911 and be transported to the hospital emergency room.  If you develope a fever above 101 F, pus (white drainage) or increased drainage or redness at the wound, or calf pain, call your surgeon's office.   Change dressing   Complete by: As directed    Maintain surgical dressing until follow up in the clinic. If the edges  start to pull up, may reinforce with tape. If the dressing is no longer working, may remove and cover with gauze and tape, but must keep the area dry and clean.  Call with any questions or concerns.   Constipation Prevention   Complete by: As directed    Drink plenty of fluids.  Prune juice may be helpful.  You may use a stool softener, such as Colace (over the counter) 100 mg twice a day.  Use MiraLax (over the counter) for constipation as needed.   Diet - low sodium heart healthy   Complete by: As directed    Increase activity slowly as tolerated   Complete by: As directed    Weight bearing as tolerated with assist device (walker, cane, etc) as directed, use it as long as suggested by your surgeon or therapist, typically at least 4-6 weeks.   Post-operative opioid taper instructions:   Complete by: As directed    POST-OPERATIVE OPIOID TAPER INSTRUCTIONS: It is important to wean off of your opioid medication as soon as possible. If you do not need pain medication after your surgery it is ok to stop day one. Opioids include: Codeine, Hydrocodone(Norco, Vicodin), Oxycodone(Percocet, oxycontin) and hydromorphone amongst others.  Long term and even short term use of opiods can cause: Increased pain response Dependence Constipation Depression Respiratory depression And more.  Withdrawal symptoms can include Flu like symptoms Nausea, vomiting And more Techniques to manage these symptoms Hydrate well Eat regular healthy meals Stay active Use relaxation techniques(deep breathing, meditating, yoga) Do Not substitute Alcohol to help with tapering If you have been on opioids for less than two weeks and do not have pain than it is ok to stop all together.  Plan to wean off of opioids This plan should start within one week post op of your joint replacement. Maintain the same interval or time between taking each dose and first decrease the dose.  Cut the total daily intake of  opioids by one  tablet each day Next start to increase the time between doses. The last dose that should be eliminated is the evening dose.      TED hose   Complete by: As directed    Use stockings (TED hose) for 2 weeks on both leg(s).  You may remove them at night for sleeping.        Follow-up Information     Cassandria Anger, PA-C. Go on 05/15/2023.   Specialty: Orthopedic Surgery Why: You are scheduled for first post op follow up on Wednesday June 12 at 3:45pm. Contact information: [address] [place] [address] [phone]                  Signed: Cassandria Anger 05/06/2023, 7:28 AM

## 2023-05-08 DIAGNOSIS — M25562 Pain in left knee: Secondary | ICD-10-CM | POA: Diagnosis not present

## 2023-05-14 DIAGNOSIS — M25562 Pain in left knee: Secondary | ICD-10-CM | POA: Diagnosis not present

## 2023-05-16 DIAGNOSIS — M25562 Pain in left knee: Secondary | ICD-10-CM | POA: Diagnosis not present

## 2023-05-19 DIAGNOSIS — N390 Urinary tract infection, site not specified: Secondary | ICD-10-CM | POA: Diagnosis not present

## 2023-05-21 DIAGNOSIS — M25562 Pain in left knee: Secondary | ICD-10-CM | POA: Diagnosis not present

## 2023-05-23 DIAGNOSIS — M25562 Pain in left knee: Secondary | ICD-10-CM | POA: Diagnosis not present

## 2023-05-28 DIAGNOSIS — M25562 Pain in left knee: Secondary | ICD-10-CM | POA: Diagnosis not present

## 2023-05-30 DIAGNOSIS — M25562 Pain in left knee: Secondary | ICD-10-CM | POA: Diagnosis not present

## 2023-06-04 DIAGNOSIS — M25562 Pain in left knee: Secondary | ICD-10-CM | POA: Diagnosis not present

## 2023-06-11 DIAGNOSIS — M25562 Pain in left knee: Secondary | ICD-10-CM | POA: Diagnosis not present

## 2023-06-18 DIAGNOSIS — M25562 Pain in left knee: Secondary | ICD-10-CM | POA: Diagnosis not present

## 2023-06-25 DIAGNOSIS — M25562 Pain in left knee: Secondary | ICD-10-CM | POA: Diagnosis not present

## 2023-06-26 DIAGNOSIS — Z4889 Encounter for other specified surgical aftercare: Secondary | ICD-10-CM | POA: Diagnosis not present

## 2023-07-13 ENCOUNTER — Other Ambulatory Visit: Payer: Self-pay | Admitting: Pulmonary Disease

## 2023-07-28 NOTE — Progress Notes (Unsigned)
Cardiology Office Note:  .   Date:  08/01/2023  ID:  [name] [name], DOB 16-May-1946, MRN [id number] PCP: Gweneth Dimitri, MD  [facility] Providers Cardiologist:  Kristeen Miss, MD    Patient Profile: .      PMH CAD NSTEMI 07/2003 s/p Taxus DES to LAD  Myoview 2012: low risk Asa stopped due to GIB, on Plavix monotherapy Ischemic cardiomyopathy Echo 07/2003 EF 30, apical and inf-apical AK Echo 06/2014: EF 60-65 Hyperlipidemia Asthma GIB Hypertension Breast cancer  She has been followed for many years by Dr. Elease Hashimoto.     ED visit 09/18/2022 for chest pain and palpitations. High-sensitivity troponins were negative x 2.  K was low at 3.4, creatinine was normal. Hgb was 14.7. CXR no acute abnormality. EKG demonstrated SR with PACs and PVCs, QTc 481, nonspecific ST-T wave changes. In clinic follow-up, she did not have concerns about frequent palpitations and decision was made to postpone Zio monitor unless symptoms worsened.  Seen by Dr. Elease Hashimoto in follow-up 12/2022 and hydrochlorothiazide was changed to spironolactone due to chronically low potassium levels. BP improved.  Last cardiology clinic visit was 01/28/2023 with Dr. Elease Hashimoto.  BP had improved.  He did not have any concerning cardiac symptoms and 52-month follow-up was recommended.       History of Present Illness: Marland Kitchen   [name] [name] is a very pleasant 77 y.o. female who is here today for 6 month follow-up. She reports she is doing well. Had knee replacement 04/30/23 without any complications, is getting along well. She enjoys yard work and used to be an avid walker but cannot do either in the heat. Is considering getting a stationary bike so she can exercise more consistently. Has home BP cuff but has not been monitoring consistently due to no concerns with the way she was feeling. Admits she loves pasta, trying to reduce her simple carbohydrates. Has lost 6 lbs. Does not sleep well. Sometimes wakes up for a few hours and reads.  Other times, even when she has slept for 10 hours, finds it hard to get out of bed and feels tired during the day. She denies chest pain, shortness of breath, lower extremity edema, fatigue, palpitations, melena, presyncope, syncope, orthopnea, and PND.    ROS: See HPI       Studies Reviewed: .        Risk Assessment/Calculations:         STOP-Bang Score:  5      Physical Exam:   VS:  BP 110/70   Pulse 60   Ht 5\' 1"  (1.549 m)   Wt 133 lb 6.4 oz (60.5 kg)   SpO2 98%   BMI 25.21 kg/m    Wt Readings from Last 3 Encounters:  08/01/23 133 lb 6.4 oz (60.5 kg)  04/30/23 134 lb (60.8 kg)  04/11/23 134 lb (60.8 kg)    GEN: Well nourished, well developed in no acute distress NECK: No JVD; No carotid bruits CARDIAC: RRR, no murmurs, rubs, gallops RESPIRATORY:  Clear to auscultation without rales, wheezing or rhonchi  ABDOMEN: Soft, non-tender, non-distended EXTREMITIES:  No edema; No deformity     ASSESSMENT AND PLAN: .    CAD without angina: History of DES to LAD in 2004. Low risk myoview in 2012. She denies chest pain, dyspnea, or other symptoms concerning for angina.  No indication for further ischemic evaluation at this time. Secondary prevention emphasized including 150 minutes moderate intensity exercise each week as well as heart healthy  diet avoiding sugar, saturated fat, processed foods, and simple carbohydrates.   Hypertension: BP is well controlled. Renal function stable on labs completed 05/01/2023. No medication changes today.   Hyperlipidemia LDL goal < 70: LDL 59 on 03/13/23. She tries to avoid saturated fat. Recent intentional weight loss of 6 lbs avoiding sugar and simple carbs. Continue rosuvastatin.   ICM: History of LVEF 30% at time of MI in 2004. EF improved on echo in 2015.  No evidence of volume overload on exam. She is feeling well and denies shortness of breath, orthopnea, PND, and edema.  Daytime somnolence: She reports that she does not sleep well and is often  fatigued during the day. Her daughter reported that she does make a lot of noise during the night, unsure if she has witnessed apnea.  STOP-BANG score is 5.  She is agreeable to an [facility] home sleep test.        Dispo: 1 year with Dr. Elease Hashimoto or me  Signed, Eligha Bridegroom, NP-C

## 2023-07-30 DIAGNOSIS — M818 Other osteoporosis without current pathological fracture: Secondary | ICD-10-CM | POA: Diagnosis not present

## 2023-08-01 ENCOUNTER — Ambulatory Visit: Payer: Medicare Other | Attending: Nurse Practitioner | Admitting: Nurse Practitioner

## 2023-08-01 ENCOUNTER — Encounter: Payer: Self-pay | Admitting: Nurse Practitioner

## 2023-08-01 VITALS — BP 110/70 | HR 60 | Ht 61.0 in | Wt 133.4 lb

## 2023-08-01 DIAGNOSIS — I2510 Atherosclerotic heart disease of native coronary artery without angina pectoris: Secondary | ICD-10-CM

## 2023-08-01 DIAGNOSIS — I10 Essential (primary) hypertension: Secondary | ICD-10-CM

## 2023-08-01 DIAGNOSIS — E785 Hyperlipidemia, unspecified: Secondary | ICD-10-CM

## 2023-08-01 DIAGNOSIS — R400 Somnolence: Secondary | ICD-10-CM

## 2023-08-01 DIAGNOSIS — I255 Ischemic cardiomyopathy: Secondary | ICD-10-CM

## 2023-08-01 NOTE — Patient Instructions (Addendum)
Medication Instructions:  Your physician recommends that you continue on your current medications as directed. Please refer to the Current Medication list given to you today.  *If you need a refill on your cardiac medications before your next appointment, please call your pharmacy*   Lab Work: None ordered  If you have labs (blood work) drawn today and your tests are completely normal, you will receive your results only by: MyChart Message (if you have MyChart) OR A paper copy in the mail If you have any lab test that is abnormal or we need to change your treatment, we will call you to review the results.   Testing/Procedures: Your physician has recommended that you have a sleep study. This test records several body functions during sleep, including: brain activity, eye movement, oxygen and carbon dioxide blood levels, heart rate and rhythm, breathing rate and rhythm, the flow of air through your mouth and nose, snoring, body muscle movements, and chest and belly movement.    Follow-Up: At [facility], you and your health needs are our priority.  As part of our continuing mission to provide you with exceptional heart care, we have created designated Provider Care Teams.  These Care Teams include your primary Cardiologist (physician) and Advanced Practice Providers (APPs -  Physician Assistants and Nurse Practitioners) who all work together to provide you with the care you need, when you need it.  We recommend signing up for the patient portal called "MyChart".  Sign up information is provided on this After Visit Summary.  MyChart is used to connect with patients for Virtual Visits (Telemedicine).  Patients are able to view lab/test results, encounter notes, upcoming appointments, etc.  Non-urgent messages can be sent to your provider as well.   To learn more about what you can do with MyChart, go to [email or web address].    Your next appointment:   12 month(s)  Provider:    Kristeen Miss, MD  or Eligha Bridegroom, NP         Other Instructions

## 2023-08-10 ENCOUNTER — Other Ambulatory Visit: Payer: Self-pay | Admitting: Pulmonary Disease

## 2023-08-23 DIAGNOSIS — M25561 Pain in right knee: Secondary | ICD-10-CM | POA: Diagnosis not present

## 2023-08-28 DIAGNOSIS — R350 Frequency of micturition: Secondary | ICD-10-CM | POA: Diagnosis not present

## 2023-09-10 ENCOUNTER — Other Ambulatory Visit: Payer: Self-pay | Admitting: Pulmonary Disease

## 2023-09-13 ENCOUNTER — Other Ambulatory Visit: Payer: Self-pay | Admitting: Pulmonary Disease

## 2023-09-24 ENCOUNTER — Other Ambulatory Visit: Payer: Self-pay | Admitting: Cardiovascular Disease

## 2023-09-24 DIAGNOSIS — I2510 Atherosclerotic heart disease of native coronary artery without angina pectoris: Secondary | ICD-10-CM

## 2023-09-26 ENCOUNTER — Encounter: Payer: Self-pay | Admitting: Nurse Practitioner

## 2023-09-26 ENCOUNTER — Ambulatory Visit: Payer: Medicare Other | Admitting: Nurse Practitioner

## 2023-09-26 ENCOUNTER — Other Ambulatory Visit: Payer: Medicare Other

## 2023-09-26 VITALS — BP 90/60 | HR 65 | Ht 61.0 in | Wt 133.0 lb

## 2023-09-26 DIAGNOSIS — E538 Deficiency of other specified B group vitamins: Secondary | ICD-10-CM | POA: Diagnosis not present

## 2023-09-26 DIAGNOSIS — D649 Anemia, unspecified: Secondary | ICD-10-CM

## 2023-09-26 LAB — CBC
HCT: 36.6 % (ref 36.0–46.0)
Hemoglobin: 12 g/dL (ref 12.0–15.0)
MCHC: 32.8 g/dL (ref 30.0–36.0)
MCV: 95.8 fL (ref 78.0–100.0)
Platelets: 344 10*3/uL (ref 150.0–400.0)
RBC: 3.82 Mil/uL — ABNORMAL LOW (ref 3.87–5.11)
RDW: 14 % (ref 11.5–15.5)
WBC: 7 10*3/uL (ref 4.0–10.5)

## 2023-09-26 LAB — VITAMIN B12: Vitamin B-12: 204 pg/mL — ABNORMAL LOW (ref 211–911)

## 2023-09-26 LAB — IBC + FERRITIN
Ferritin: 28.8 ng/mL (ref 10.0–291.0)
Iron: 89 ug/dL (ref 42–145)
Saturation Ratios: 27.8 % (ref 20.0–50.0)
TIBC: 320.6 ug/dL (ref 250.0–450.0)
Transferrin: 229 mg/dL (ref 212.0–360.0)

## 2023-09-26 NOTE — Patient Instructions (Addendum)
Your provider has requested that you go to the basement level for lab work before leaving today. Press "B" on the elevator. The lab is located at the first door on the left as you exit the elevator.  Our office will contact you with Dr. Elana Alm recommendations regarding a future colonoscopy.  Due to recent changes in healthcare laws, you may see the results of your imaging and laboratory studies on MyChart before your provider has had a chance to review them.  We understand that in some cases there may be results that are confusing or concerning to you. Not all laboratory results come back in the same time frame and the provider may be waiting for multiple results in order to interpret others.  Please give [place] 48 hours in order for your provider to thoroughly review all the results before contacting the office for clarification of your results.    Thank you for trusting me with your gastrointestinal care!   Alcide Evener, CRNP

## 2023-09-26 NOTE — Progress Notes (Signed)
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  She is known by Dr. Lavon Paganini. S/P left total knee surgery 04/30/2023.  She presents today to discuss scheduling a colonoscopy.  Her most recent colonoscopy was 05/02/2016 which identified one 3 mm tubular adenomatous polyp removed from the ascending colon, severe diverticulosis in the entire examined colon and nonbleeding internal hemorrhoids.  Colonoscopy 03/21/2011 identified 1 sessile polyp removed from the colon.  No known family history of colon cancer.  She typically passes a normal formed brown bowel movement daily, however, she passes loose to watery stools if she eats a lot of fiber.  She takes Colace if she develops constipation.  No rectal bleeding or black stools.  She denies having any heartburn or upper abdominal pain.  No dysphagia.  She remains on Plavix with history of MI and stent placement 2004.  She denies having any chest pain.    She has a history of GERD and was admitted to the hospital with an upper GI 06/20/2021.  underwent an upper endoscopy which identified a nonbleeding gastric ulcer with a clean base for which she was prescribed Pantoprazole 40 mg twice daily.  Biopsies were negative for H. pylori.  Her clinical status stabilized and she was discharged home on 06/24/2021.  A follow-up EGD as an outpatient was completed 08/23/2021 showed a nonbleeding gastric ulcer, a single gastric polyp and grade B reflux esophagitis.  She was instructed to continue Pantoprazole 40 mg twice  daily for 3 months then once daily and to take Sucralfate 1 g p.o. twice daily for 2 weeks.   She is status post total left knee replacement surgery 04/30/2023.  Her hemoglobin level was 12.8 prior to surgery and postop labs showed a hemoglobin level of 10.5 on 05/01/2023.  She denies having any rectal bleeding or melena.  She remains on Pantoprazole 40 mg once daily.  No NSAID use.      Latest Ref Rng & Units 05/01/2023    3:39 AM 04/11/2023    1:56 PM 10/19/2022    2:56 PM  CBC  WBC 4.0 - 10.5 K/uL 13.5  6.6  5.1   Hemoglobin 12.0 - 15.0 g/dL 40.9  81.1  91.4   Hematocrit 36.0 - 46.0 % 32.8  40.9  40.9   Platelets 150 - 400 K/uL 216  299  310.0        Latest Ref Rng & Units 05/01/2023    3:39 AM 04/11/2023    1:56 PM 01/28/2023   11:33 AM  CMP  Glucose 70 - 99 mg/dL 782  956  86   BUN 8 - 23 mg/dL 18  30  23    Creatinine 0.44 - 1.00 mg/dL 2.13  0.86  5.78   Sodium 135 - 145 mmol/L 136  138  141   Potassium 3.5 - 5.1 mmol/L 4.9  4.6  4.4   Chloride 98 - 111 mmol/L 110  107  104   CO2 22 - 32 mmol/L 22  25  23    Calcium 8.9 - 10.3 mg/dL 7.2  8.7  9.3     CTAP 10/26/2022:  FINDINGS: Lower chest: Scarring/atelectasis in the bilateral lung bases. Left-sided Bochdalek type hernia.   Hepatobiliary: Bilobar hypodense hepatic lesions are similar prior examination and while technically too small to accurately characterize statistically likely to reflect cysts or hemangiomas. Gallbladder is unremarkable. No biliary ductal dilation.   Pancreas: No pancreatic ductal dilation or evidence of acute inflammation.   Spleen: No splenomegaly.   Adrenals/Urinary Tract: 9 mm nodule in the left adrenal gland. Right adrenal gland appears normal. Bilateral renal sinus cysts, considered benign requiring no independent imaging follow-up. Nonobstructive 5 mm left lower pole renal stone. Kidneys demonstrate symmetric enhancement and excretion of contrast material.   Stomach/Bowel: Stomach is  minimally distended limiting evaluation. No pathologic dilation of small or large bowel. Appendix is not confidently identified however there is no pericecal inflammation. Pancolonic diverticulosis without findings of acute diverticulitis.   Vascular/Lymphatic: Normal caliber abdominal aorta. No pathologically enlarged abdominal or pelvic lymph nodes.   Reproductive: Status post hysterectomy. No adnexal masses.   Other: No significant abdominopelvic free fluid.   Musculoskeletal: Multilevel degenerative changes spine.   IMPRESSION: 1. No acute abnormality in the abdomen or pelvis. 2. Pancolonic diverticulosis without findings of acute diverticulitis. 3. Nonobstructive 5 mm left lower pole renal stone. 4. Indeterminate 9 mm left adrenal nodule, statistically likely benign. No follow-up imaging is recommended.  PAST GI PROCEDURES:  EGD 08/23/2021: - LA Grade B reflux esophagitis with no bleeding.  - Non-bleeding gastric ulcer with no stigmata of bleeding. Biopsied.  - A single gastric polyp. Biopsied.  - Normal examined duodenum. 1. Surgical [P], gastric antrum ulcer - GASTRIC ANTRAL MUCOSA SHOWING MARKED NONSPECIFIC REACTIVE GASTROPATHY WITH EROSION - WARTHIN STARRY STAIN IS NEGATIVE FOR HELICOBACTER PYLORI 2. Surgical [P], gastric cardia polyp - FUNDIC GLAND POLYP(S) - NEGATIVE FOR DYSPLASIA  EGD 06/21/2021 as an inpatient by Dr. Adela Lank: - LA Grade B reflux esophagitis with no bleeding. - Non-bleeding gastric ulcer with no stigmata of bleeding. Biopsied. - A single gastric polyp. Biopsied. - Normal examined duodenum.  Colonoscopy 05/02/2016: - One 3 mm polyp in the ascending colon, removed with a cold biopsy forceps. Resected and retrieved.  - Severe Diverticulosis in the entire examined colon.  - Non-bleeding internal hemorrhoids. Surgical [P], ascending polyp - TUBULAR ADENOMA(S). - HIGH GRADE DYSPLASIA IS NOT IDENTIFIED.  Colonoscopy 03/21/2011: Sessile polyp   Moderate diverticulosis throughout the colon Otherwise normal examination  Current Outpatient Medications on File Prior to Visit  Medication Sig Dispense Refill   acetaminophen (TYLENOL) 500 MG tablet Take 500-1,000 mg by mouth every 6 (six) hours as needed for mild pain or headache.     albuterol (VENTOLIN HFA) 108 (90 Base) MCG/ACT inhaler Inhale 2 puffs into the lungs every 4 (four) hours as needed for shortness of breath.     amLODipine (NORVASC) 5 MG tablet Take 1 tablet (5 mg total) by mouth daily. 90 tablet 3   ARNUITY ELLIPTA 200 MCG/ACT AEPB Inhale 1 puff into the lungs daily. 30 each 0   Calcium Carbonate (CALCIUM 600 PO) Take 600 mg by mouth daily.     clopidogrel (PLAVIX) 75 MG tablet TAKE ONE TABLET BY MOUTH ONCE DAILY 90 tablet 3   conjugated estrogens (PREMARIN) vaginal cream Place 0.5 Applicatorfuls vaginally 2 (two) times a week. On Tuesday and Saturday     Use as directed     denosumab (PROLIA) 60 MG/ML SOSY injection Inject 60 mg into the skin every 6 (six) months.  docusate sodium (COLACE) 50 MG capsule Take 50 mg by mouth daily as needed for mild constipation.     escitalopram (LEXAPRO) 10 MG tablet Take 10 mg by mouth daily.     fluticasone (FLONASE) 50 MCG/ACT nasal spray Place 1 spray into both nostrils 2 (two) times daily.     irbesartan (AVAPRO) 150 MG tablet Take 1 tablet (150 mg total) by mouth daily. 90 tablet 3   ketotifen (ZADITOR) 0.025 % ophthalmic solution Place 1 drop into both eyes 2 (two) times daily.     loratadine (CLARITIN) 10 MG tablet Take 10 mg by mouth daily.     metroNIDAZOLE (METROGEL) 0.75 % gel Apply 1 Application topically daily.     nebivolol (BYSTOLIC) 5 MG tablet Take 1 tablet (5 mg total) by mouth daily. 90 tablet 3   pantoprazole (PROTONIX) 40 MG tablet TAKE ONE TABLET BY MOUTH ONCE DAILY 90 tablet 3   polyvinyl alcohol (LIQUIFILM TEARS) 1.4 % ophthalmic solution Place 1 drop into both eyes 2 (two) times daily.     potassium chloride  SA (KLOR-CON M20) 20 MEQ tablet Take 1 tablet (20 mEq total) by mouth daily. 90 tablet 3   rosuvastatin (CRESTOR) 5 MG tablet Take 1 tablet (5 mg total) by mouth daily. 30 tablet 6   Simethicone (GAS-X MAXIMUM STRENGTH PO) Take 250 mg by mouth daily as needed (gas).     spironolactone (ALDACTONE) 25 MG tablet Take 1 tablet (25 mg total) by mouth daily. 90 tablet 3   Current Facility-Administered Medications on File Prior to Visit  Medication Dose Route Frequency Provider Last Rate Last Admin   0.9 %  sodium chloride infusion  500 mL Intravenous Once Nandigam, Eleonore Chiquito, MD       Allergies  Allergen Reactions   Clindamycin Diarrhea and Nausea And Vomiting    Blood in diarrhea   Asa [Aspirin] Other (See Comments)    GI bleed   Codeine Other (See Comments)    headache   Lisinopril Cough   Simvastatin Diarrhea   Cephalexin Other (See Comments) and Rash   Current Medications, Allergies, Past Medical History, Past Surgical History, Family History and Social History were reviewed in [employer] record.  Review of Systems:   Constitutional: Negative for fever, sweats, chills or weight loss.  Respiratory: Negative for shortness of breath.   Cardiovascular: Negative for chest pain, palpitations and leg swelling.  Gastrointestinal: See HPI.  Musculoskeletal: Negative for back pain or muscle aches.  Neurological: Negative for dizziness, headaches or paresthesias.   Physical Exam: BP 90/60   Pulse 65   Ht 5\' 1"  (1.549 m)   Wt 133 lb (60.3 kg)   SpO2 96%   BMI 25.13 kg/m  General: 77 year old female in no acute distress. Head: Normocephalic and atraumatic. Eyes: No scleral icterus. Conjunctiva pink . Ears: Normal auditory acuity. Mouth: Dentition intact. No ulcers or lesions.  Lungs: Clear throughout to auscultation. Heart: Regular rate and rhythm, no murmur. Abdomen: Soft, nondistended.  Very mild tenderness to the LLQ without rebound or guarding.  No masses or  hepatomegaly. Normal bowel sounds x 4 quadrants.  Rectal: Deferred. Musculoskeletal: Symmetrical with no gross deformities. Extremities: No edema. Neurological: Alert oriented x 4. No focal deficits.  Psychological: Alert and cooperative. Normal mood and affect  Assessment and Recommendations:  77 year old female with a history of colon polyps presents to schedule a colonoscopy.  Her most recent colonoscopy 05/02/2016 identified one 3 mm tubular adenomatous polyp removed from  the ascending colon and severe diverticulosis in the entire examined colon.  No known family history of colorectal cancer.  CTAP 10/22/2022 showed pandiverticulosis without diverticulitis or colon mass. -I will consult with Dr. Lavon Paganini to further verify if a colon polyp surveillance colonoscopy is warranted at the age of 20 -To further consider EGD and colonoscopy if today's laboratory studies show evidence of iron deficiency anemia  Normocytic anemia. Patient's Hg level dropped from 12.8-10.5 s/p total left knee replacement surgery 04/2023 which is not unusual postop.  Repeat CBC has not been done recently.  No overt GI bleeding. -I will recheck a CBC today with iron studies today -Patient also requests to check a B12 level since she had B12 deficiency in the past and stopped taking B12 injections a few months ago  History of GERD, upper GI bleed secondary to gastric ulcers 06/2021 -Continue Pantoprazole 40 mg daily indefinitely  CAD s/p MI and DES 2004 on Plavix

## 2023-10-01 ENCOUNTER — Other Ambulatory Visit: Payer: Self-pay | Admitting: Medical Genetics

## 2023-10-01 DIAGNOSIS — K219 Gastro-esophageal reflux disease without esophagitis: Secondary | ICD-10-CM | POA: Diagnosis not present

## 2023-10-01 DIAGNOSIS — Z006 Encounter for examination for normal comparison and control in clinical research program: Secondary | ICD-10-CM

## 2023-10-01 DIAGNOSIS — Z0000 Encounter for general adult medical examination without abnormal findings: Secondary | ICD-10-CM | POA: Diagnosis not present

## 2023-10-01 DIAGNOSIS — D500 Iron deficiency anemia secondary to blood loss (chronic): Secondary | ICD-10-CM | POA: Diagnosis not present

## 2023-10-01 DIAGNOSIS — M810 Age-related osteoporosis without current pathological fracture: Secondary | ICD-10-CM | POA: Diagnosis not present

## 2023-10-01 DIAGNOSIS — G479 Sleep disorder, unspecified: Secondary | ICD-10-CM | POA: Diagnosis not present

## 2023-10-08 DIAGNOSIS — D500 Iron deficiency anemia secondary to blood loss (chronic): Secondary | ICD-10-CM | POA: Diagnosis not present

## 2023-10-08 DIAGNOSIS — K219 Gastro-esophageal reflux disease without esophagitis: Secondary | ICD-10-CM | POA: Diagnosis not present

## 2023-10-08 DIAGNOSIS — M810 Age-related osteoporosis without current pathological fracture: Secondary | ICD-10-CM | POA: Diagnosis not present

## 2023-10-08 DIAGNOSIS — E782 Mixed hyperlipidemia: Secondary | ICD-10-CM | POA: Diagnosis not present

## 2023-10-08 DIAGNOSIS — G479 Sleep disorder, unspecified: Secondary | ICD-10-CM | POA: Diagnosis not present

## 2023-10-09 DIAGNOSIS — E538 Deficiency of other specified B group vitamins: Secondary | ICD-10-CM | POA: Diagnosis not present

## 2023-10-09 DIAGNOSIS — I10 Essential (primary) hypertension: Secondary | ICD-10-CM | POA: Diagnosis not present

## 2023-10-09 DIAGNOSIS — J4540 Moderate persistent asthma, uncomplicated: Secondary | ICD-10-CM | POA: Diagnosis not present

## 2023-10-09 DIAGNOSIS — R7989 Other specified abnormal findings of blood chemistry: Secondary | ICD-10-CM | POA: Diagnosis not present

## 2023-10-09 DIAGNOSIS — K219 Gastro-esophageal reflux disease without esophagitis: Secondary | ICD-10-CM | POA: Diagnosis not present

## 2023-10-09 DIAGNOSIS — R350 Frequency of micturition: Secondary | ICD-10-CM | POA: Diagnosis not present

## 2023-10-09 DIAGNOSIS — M810 Age-related osteoporosis without current pathological fracture: Secondary | ICD-10-CM | POA: Diagnosis not present

## 2023-10-14 ENCOUNTER — Other Ambulatory Visit: Payer: Self-pay | Admitting: Pulmonary Disease

## 2023-10-16 NOTE — Telephone Encounter (Signed)
Dr. Lavon Paganini, refer to office visit 09/26/2023, pls verify if you want patient to schedule a colonoscopy. Let me know your recommendations and I will contact patient. THX.

## 2023-10-16 NOTE — Telephone Encounter (Signed)
She is otherwise healthy, not unreasonable to proceed with surveillance colonoscopy for h/o adenomatous colon polyp. Most recent hgb improved, likely drop in hgb was post op after surgery

## 2023-10-17 ENCOUNTER — Other Ambulatory Visit: Payer: Self-pay

## 2023-10-17 ENCOUNTER — Telehealth: Payer: Self-pay

## 2023-10-17 DIAGNOSIS — Z1211 Encounter for screening for malignant neoplasm of colon: Secondary | ICD-10-CM

## 2023-10-17 DIAGNOSIS — Z860100 Personal history of colon polyps, unspecified: Secondary | ICD-10-CM

## 2023-10-17 NOTE — Telephone Encounter (Signed)
Left message for patient to call back  

## 2023-10-17 NOTE — Telephone Encounter (Signed)
Viviann Spare, pls contact patient and schedule her for a colonoscopy with Dr. Lavon Paganini if she is willing to do so. Refer to messages below.  THX.

## 2023-10-17 NOTE — Telephone Encounter (Signed)
Scheduled colon with patient for 11/15/23 at 2:00 pm with Dr. Lavon Paganini. Amb ref placed & instructions sent to patient in mychart. She is not diabetic, however is on a blood thinner (Plavix). Clearance sent to cardiologist in separate phone note. She's been advised to call back to our office at least 1 week prior if she has not heard from [place] regarding clearance. She will call back with any further questions. Pt verbalized all understanding.

## 2023-10-17 NOTE — Telephone Encounter (Signed)
[facility] Pre-operative Risk Assessment     Request for surgical clearance:     Endoscopy Procedure  What type of surgery is being performed?     Colonoscopy  When is this surgery scheduled?     11/15/23  What type of clearance is required ?   Pharmacy  Are there any medications that need to be held prior to surgery and how long? Plavix   Practice name and name of physician performing surgery?      [facility]; Dr. Lavon Paganini  What is your office phone and fax number?      Phone- [phone]  Fax- [phone]  Anesthesia type (None, local, MAC, general) ?       MAC

## 2023-10-18 ENCOUNTER — Telehealth: Payer: Self-pay

## 2023-10-18 NOTE — Telephone Encounter (Signed)
Spoke with patient who is agreeable to do a tele visit on 11/26 at 3:20 pm. Med rec and consent done.

## 2023-10-18 NOTE — Telephone Encounter (Signed)
I left a message for the patient to call our office to schedule a tele visit for pre-op clearance.  

## 2023-10-18 NOTE — Telephone Encounter (Signed)
   Name: [name] [name]  DOB: 1946/05/12  MRN: [id number]  Primary Cardiologist: Kristeen Miss, MD   Preoperative team, please contact this patient and set up a phone call appointment for further preoperative risk assessment. Please obtain consent and complete medication review. Thank you for your help.  I confirm that guidance regarding antiplatelet and oral anticoagulation therapy has been completed and, if necessary, noted below.  Per office protocol, if patient is without any new symptoms or concerns at the time of their virtual visit, she may hold Plavix for 5 days prior to procedure. Please resume Plavix as soon as possible postprocedure, at the discretion of the surgeon.   I also confirmed the patient resides in the state of [place]. As per [facility] Medical Board telemedicine laws, the patient must reside in the state in which the provider is licensed.   Joylene Grapes, NP 10/18/2023, 12:18 PM [facility]

## 2023-10-18 NOTE — Telephone Encounter (Signed)
Patient returning call. Transferred to [place], CMA.

## 2023-10-18 NOTE — Telephone Encounter (Signed)
  Patient Consent for Virtual Visit        [name] [name] has provided verbal consent on 10/18/2023 for a virtual visit (video or telephone).   CONSENT FOR VIRTUAL VISIT FOR:  [name] [name]  By participating in this virtual visit I agree to the following:  I hereby voluntarily request, consent and authorize [facility] and its employed or contracted physicians, physician assistants, nurse practitioners or other licensed health care professionals (the Practitioner), to provide me with telemedicine health care services (the "Services") as deemed necessary by the treating Practitioner. I acknowledge and consent to receive the Services by the Practitioner via telemedicine. I understand that the telemedicine visit will involve communicating with the Practitioner through live audiovisual communication technology and the disclosure of certain medical information by electronic transmission. I acknowledge that I have been given the opportunity to request an in-person assessment or other available alternative prior to the telemedicine visit and am voluntarily participating in the telemedicine visit.  I understand that I have the right to withhold or withdraw my consent to the use of telemedicine in the course of my care at any time, without affecting my right to future care or treatment, and that the Practitioner or I may terminate the telemedicine visit at any time. I understand that I have the right to inspect all information obtained and/or recorded in the course of the telemedicine visit and may receive copies of available information for a reasonable fee.  I understand that some of the potential risks of receiving the Services via telemedicine include:  Delay or interruption in medical evaluation due to technological equipment failure or disruption; Information transmitted may not be sufficient (e.g. poor resolution of images) to allow for appropriate medical decision making by the  Practitioner; and/or  In rare instances, security protocols could fail, causing a breach of personal health information.  Furthermore, I acknowledge that it is my responsibility to provide information about my medical history, conditions and care that is complete and accurate to the best of my ability. I acknowledge that Practitioner's advice, recommendations, and/or decision may be based on factors not within their control, such as incomplete or inaccurate data provided by me or distortions of diagnostic images or specimens that may result from electronic transmissions. I understand that the practice of medicine is not an exact science and that Practitioner makes no warranties or guarantees regarding treatment outcomes. I acknowledge that a copy of this consent can be made available to me via my patient portal [facility] MyChart), or I can request a printed copy by calling the office of [facility].    I understand that my insurance will be billed for this visit.   I have read or had this consent read to me. I understand the contents of this consent, which adequately explains the benefits and risks of the Services being provided via telemedicine.  I have been provided ample opportunity to ask questions regarding this consent and the Services and have had my questions answered to my satisfaction. I give my informed consent for the services to be provided through the use of telemedicine in my medical care

## 2023-10-21 ENCOUNTER — Encounter (INDEPENDENT_AMBULATORY_CARE_PROVIDER_SITE_OTHER): Payer: Medicare Other | Admitting: Cardiology

## 2023-10-21 ENCOUNTER — Telehealth: Payer: Self-pay

## 2023-10-21 DIAGNOSIS — G4733 Obstructive sleep apnea (adult) (pediatric): Secondary | ICD-10-CM

## 2023-10-21 NOTE — Telephone Encounter (Signed)
**Note De-Identified Durk Carmen Obfuscation** Ordering provider: Eligha Bridegroom, NP Associated diagnoses: Somnolence-R40.0 WatchPAT PA obtained on 10/21/2023 by Taaj Hurlbut, Lorelle Formosa, LPN. Authorization: Per the [facility] Provider Portal: Procedure code: 91478 Description: Sleep study, unattended, simultaneous recording; heart rate, oxygen saturation, respiratory analysis (eg, by airflow or peripheral arterial tone), and sleep time Inquiry summary: Notification/Prior Authorization not required for this service. Patient notified of PIN ([id number]) on 10/21/2023 Grace Haggart Notification Method: MyChart message. I also called the pt but got no answer so I left a message on her VM asking her to call Larita Fife back at [phone] if she has any questions or concerns.  Phone note routed to covering staff for follow-up.

## 2023-10-22 ENCOUNTER — Ambulatory Visit: Payer: Medicare Other | Attending: Nurse Practitioner

## 2023-10-22 DIAGNOSIS — R400 Somnolence: Secondary | ICD-10-CM

## 2023-10-22 NOTE — Procedures (Signed)
     SLEEP STUDY REPORT Patient Information Study Date: 10/21/2023 Patient Name: [name] [name] Patient ID: [id number] Birth Date: 1946/01/23 Age: 77 Gender: Female BMI: 25.0 (W=132 lb, H=5' 1'') Referring Physician: Eligha Bridegroom, NP  TEST DESCRIPTION: Home sleep apnea testing was completed using the WatchPat, a Type 1 device, utilizing  peripheral arterial tonometry (PAT), chest movement, actigraphy, pulse oximetry, pulse rate, body position and snore.  AHI was calculated with apnea and hypopnea using valid sleep time as the denominator. RDI includes apneas,  hypopneas, and RERAs. The data acquired and the scoring of sleep and all associated events were performed in  accordance with the recommended standards and specifications as outlined in the AASM Manual for the Scoring of  Sleep and Associated Events 2.2.0 (2015).  FINDINGS:  1. Severe Obstructive Sleep Apnea with AHI33.1/hr.   2. No Central Sleep Apnea with pAHIc 1.8/hr.  3. Oxygen desaturations as low as 82%.  4. Moderate snoring was present. O2 sats were < 88% for 4.7 min.  5. Total sleep time was 8 hrs and 20 min.  6. 11.8% of total sleep time was spent in REM sleep.   7. Normal sleep onset latency at 18 min.   8. Prolonged REM sleep onset latency at 240 min.   9. Total awakenings were 6.  10. Arrhythmia detection: None  DIAGNOSIS:  Severe Obstructive Sleep Apnea (G47.33)  RECOMMENDATIONS: 1. Clinical correlation of these findings is necessary. The decision to treat obstructive sleep apnea (OSA) is usually  based on the presence of apnea symptoms or the presence of associated medical conditions such as Hypertension,  Congestive Heart Failure, Atrial Fibrillation or Obesity. The most common symptoms of OSA are snoring, gasping for  breath while sleeping, daytime sleepiness and fatigue.   2. Initiating apnea therapy is recommended given the presence of symptoms and/or associated conditions.  Recommend proceeding  with one of the following:   a. Auto-CPAP therapy with a pressure range of 5-20cm H2O.   b. An oral appliance (OA) that can be obtained from certain dentists with expertise in sleep medicine. These are  primarily of use in non-obese patients with mild and moderate disease.   c. An ENT consultation which may be useful to look for specific causes of obstruction and possible treatment  options.   d. If patient is intolerant to PAP therapy, consider referral to ENT for evaluation for hypoglossal nerve stimulator.   3. Close follow-up is necessary to ensure success with CPAP or oral appliance therapy for maximum benefit .  4. A follow-up oximetry study on CPAP is recommended to assess the adequacy of therapy and determine the need  for supplemental oxygen or the potential need for Bi-level therapy. An arterial blood gas to determine the adequacy of  baseline ventilation and oxygenation should also be considered.  5. Healthy sleep recommendations include: adequate nightly sleep (normal 7-9 hrs/night), avoidance of caffeine after  noon and alcohol near bedtime, and maintaining a sleep environment that is cool, dark and quiet.  6. Weight loss for overweight patients is recommended. Even modest amounts of weight loss can significantly  improve the severity of sleep apnea.  7. Snoring recommendations include: weight loss where appropriate, side sleeping, and avoidance of alcohol before  bed.  8. Operation of motor vehicle should be avoided when sleepy.  Signature: Armanda Magic, MD; [facility]; Diplomat, American Board of Sleep  Medicine Electronically Signed: 10/22/2023 11:03:49 AM

## 2023-10-28 ENCOUNTER — Other Ambulatory Visit (HOSPITAL_COMMUNITY): Payer: Self-pay

## 2023-10-29 ENCOUNTER — Ambulatory Visit: Payer: Medicare Other | Attending: Physician Assistant

## 2023-10-29 DIAGNOSIS — Z01810 Encounter for preprocedural cardiovascular examination: Secondary | ICD-10-CM | POA: Diagnosis not present

## 2023-10-29 NOTE — Progress Notes (Signed)
Virtual Visit via Telephone Note   Because of [name] [name]'s co-morbid illnesses, she is at least at moderate risk for complications without adequate follow up.  This format is felt to be most appropriate for this patient at this time.  The patient did not have access to video technology/had technical difficulties with video requiring transitioning to audio format only (telephone).  All issues noted in this document were discussed and addressed.  No physical exam could be performed with this format.  Please refer to the patient's chart for her consent to telehealth for [facility].  Evaluation Performed:  Preoperative cardiovascular risk assessment _____________   Date:  10/29/2023   Patient ID:  [name] [name], DOB June 27, 1946, MRN [id number] Patient Location:  Home Provider location:   Office  Primary Care Provider:  Gweneth Dimitri, MD Primary Cardiologist:  Kristeen Miss, MD  Chief Complaint / Patient Profile   77 y.o. y/o female with a h/o DES to LAD in 2004 with low risk Myoview in 2012, hypertension, hyperlipidemia, ICM with LVEF 30% at the time of MI back in 2004 with improved LVEF on echo in 2015, daytime somnolence being worked up for possible sleep apnea who is pending colonoscopy and presents today for telephonic preoperative cardiovascular risk assessment.  History of Present Illness    [name] [name] is a 77 y.o. female who presents via Web designer for a telehealth visit today.  Pt was last seen in cardiology clinic on 08/01/2023 by Clair Gulling, NP.  At that time [name] [name] was doing well.  The patient is now pending procedure as outlined above. Since her last visit, she has been doing well.  She does mention that when she saw her primary care she thought that her blood pressure was too low and reduced her amlodipine to 2.5 mg.  Also, she increased her rosuvastatin since her HDL was also too low.  Today, blood pressure is much better  124/60.  Did not have any lightheadedness or dizziness recently.  No chest pain or shortness of breath.  She may hold Plavix for 5 days prior to procedure. Please resume Plavix as soon as possible postprocedure, at the discretion of the surgeon.   Reports no shortness of breath nor dyspnea on exertion. Reports no chest pain, pressure, or tightness. No edema, orthopnea, PND. Reports no palpitations.    Past Medical History    Past Medical History:  Diagnosis Date   Allergic rhinitis    Anemia    Anxiety disorder    Arthritis    Asthma    Blood transfusion without reported diagnosis    Breast cancer (HCC)    bilateral   precancerous had double mastectomy   CAD (coronary artery disease)    a. NSTEMI 8/04 => LHC 07/2003:  mLAD hazy 75% => Taxus DES, EF 30% with ant-apical, apical and inf-apical AK.  b.  Echo 4/05: EF 60%.  c.  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 was 77 years old   BIOPSY  06/21/2021   Procedure: BIOPSY;  Surgeon: Benancio Deeds, MD;  Location: [facility];  Service: Gastroenterology;;   CARDIOVASCULAR STRESS TEST  07/01/2006   ef 80%   CORONARY ANGIOPLASTY WITH STENT PLACEMENT  12/03/2002   LAD   ESOPHAGOGASTRODUODENOSCOPY N/A 06/21/2021   Procedure:  ESOPHAGOGASTRODUODENOSCOPY (EGD);  Surgeon: Benancio Deeds, MD;  Location: [facility];  Service: Gastroenterology;  Laterality: N/A;   knee srthroscopy Left    NASAL SINUS SURGERY Left 09/25/2021   Procedure: LEFT ENDOSCOPIC MAXILLARY ANTROSTOMY;  Surgeon: Serena Colonel, MD;  Location: [facility];  Service: ENT;  Laterality: Left;   TOTAL KNEE ARTHROPLASTY Left 04/30/2023   Procedure: TOTAL KNEE ARTHROPLASTY;  Surgeon: Durene Romans, MD;  Location: [facility];  Service: Orthopedics;  Laterality: Left;   US ECHOCARDIOGRAPHY  03/27/2004   resting ef 60%    Allergies  Allergies  Allergen Reactions   Clindamycin Diarrhea and Nausea And Vomiting    Blood in diarrhea   Asa [Aspirin] Other (See Comments)    GI bleed   Codeine Other (See Comments)    headache   Lisinopril Cough   Simvastatin Diarrhea   Cephalexin Other (See Comments) and Rash    Home Medications    Prior to Admission medications   Medication Sig Start Date End Date Taking? Authorizing Provider  acetaminophen (TYLENOL) 500 MG tablet Take 500-1,000 mg by mouth every 6 (six) hours as needed for mild pain or headache.    [provider]  albuterol (VENTOLIN HFA) 108 (90 Base) MCG/ACT inhaler Inhale 2 puffs into the lungs every 4 (four) hours as needed for shortness of breath.    [provider]  amLODipine (NORVASC) 5 MG tablet Take 1 tablet (5 mg total) by mouth daily. Patient not taking: Reported on 10/18/2023 01/02/23   Nahser, Deloris Ping, MD  amLODipine (NORVASC) 5 MG tablet Take 2.5 mg by mouth daily.    [provider]  ARNUITY ELLIPTA 200 MCG/ACT AEPB Inhale 1 puff into the lungs daily. 10/14/23   Tomma Lightning, MD  Calcium Carbonate (CALCIUM 600 PO) Take 600 mg by mouth daily.    [provider]  clopidogrel (PLAVIX) 75 MG tablet TAKE ONE TABLET BY MOUTH ONCE DAILY 09/24/23   Nahser, Deloris Ping, MD  conjugated estrogens (PREMARIN) vaginal cream Place 0.5  Applicatorfuls vaginally 2 (two) times a week. On Tuesday and Saturday     Use as directed 05/03/11   [provider]  denosumab (PROLIA) 60 MG/ML SOSY injection Inject 60 mg into the skin every 6 (six) months.    [provider]  docusate sodium (COLACE) 50 MG capsule Take 50 mg by mouth daily as needed for mild constipation.    [provider]  escitalopram (LEXAPRO) 10 MG tablet Take 10 mg by mouth daily.    [provider]  fluticasone (FLONASE) 50 MCG/ACT nasal spray Place 1 spray into both nostrils 2 (two) times daily. 01/08/14   [provider]  irbesartan (AVAPRO) 150 MG tablet Take 1 tablet (150 mg total) by mouth daily. 04/18/23   Nahser, Deloris Ping, MD  ketotifen (ZADITOR) 0.025 % ophthalmic solution Place 1 drop into both eyes 2 (two)  times daily.    [provider]  loratadine (CLARITIN) 10 MG tablet Take 10 mg by mouth daily.    [provider]  metroNIDAZOLE (METROGEL) 0.75 % gel Apply 1 Application topically daily. 11/17/22   [provider]  nebivolol (BYSTOLIC) 5 MG tablet Take 1 tablet (5 mg total) by mouth daily. 01/02/23   Nahser, Deloris Ping, MD  pantoprazole (PROTONIX) 40 MG tablet TAKE ONE TABLET BY MOUTH ONCE DAILY 01/28/23   Napoleon Form, MD  polyvinyl alcohol (LIQUIFILM TEARS) 1.4 % ophthalmic solution Place 1 drop into both eyes 2 (two) times daily.    [provider]  potassium chloride SA (KLOR-CON M20) 20 MEQ tablet Take 1 tablet (20 mEq total) by mouth daily. 12/26/22   Nahser, Deloris Ping, MD  rosuvastatin (CRESTOR) 5 MG tablet Take 1 tablet (5 mg total) by mouth daily. 01/13/13   Nahser, Deloris Ping, MD  Simethicone (GAS-X MAXIMUM STRENGTH PO) Take 250 mg by mouth daily as needed (gas).    [provider]  spironolactone (ALDACTONE) 25 MG tablet Take 1 tablet (25 mg total) by mouth daily. 12/26/22   Nahser, Deloris Ping, MD    Physical Exam    Vital Signs:  [name] [name] does not have  vital signs available for review today.  Given telephonic nature of communication, physical exam is limited. AAOx3. NAD. Normal affect.  Speech and respirations are unlabored.  Accessory Clinical Findings    None  Assessment & Plan    1.  Preoperative Cardiovascular Risk Assessment:  [name] [name]'s perioperative risk of a major cardiac event is 0.9% according to the Revised Cardiac Risk Index (RCRI).  Therefore, she is at low risk for perioperative complications.   Her functional capacity is good at 5.62 METs according to the Duke Activity Status Index (DASI). Recommendations: According to ACC/AHA guidelines, no further cardiovascular testing needed.  The patient may proceed to surgery at acceptable risk.   Antiplatelet and/or Anticoagulation Recommendations: Clopidogrel (Plavix) can be held for 5 days prior to her surgery and resumed as soon as possible post op.  The patient was advised that if she develops new symptoms prior to surgery to contact our office to arrange for a follow-up visit, and she verbalized understanding.   A copy of this note will be routed to requesting surgeon.  Time:   Today, I have spent 5 minutes with the patient with telehealth technology discussing medical history, symptoms, and management plan.     Sharlene Dory, PA-C  10/29/2023, 3:21 PM

## 2023-10-30 DIAGNOSIS — N3000 Acute cystitis without hematuria: Secondary | ICD-10-CM | POA: Diagnosis not present

## 2023-11-04 ENCOUNTER — Telehealth: Payer: Self-pay | Admitting: *Deleted

## 2023-11-04 DIAGNOSIS — R400 Somnolence: Secondary | ICD-10-CM

## 2023-11-04 DIAGNOSIS — I2510 Atherosclerotic heart disease of native coronary artery without angina pectoris: Secondary | ICD-10-CM

## 2023-11-04 DIAGNOSIS — G4733 Obstructive sleep apnea (adult) (pediatric): Secondary | ICD-10-CM

## 2023-11-04 NOTE — Telephone Encounter (Signed)
The patient has been notified of the result and verbalized understanding.  All questions (if any) were answered. Latrelle Dodrill, CMA 11/04/2023 4:28 PM    Patient would like to think about her decision then call us back with her decision.

## 2023-11-04 NOTE — Telephone Encounter (Signed)
-----   Message from Armanda Magic sent at 10/22/2023 11:05 AM EST ----- Please let patient know that they have sleep apnea.  Recommend therapeutic CPAP titration for treatment of patient's sleep disordered breathing.  If unable to perform an in lab titration then initiate ResMed auto CPAP from 4 to 15cm H2O with heated humidity and mask of choice and overnight pulse ox on CPAP.

## 2023-11-05 DIAGNOSIS — L814 Other melanin hyperpigmentation: Secondary | ICD-10-CM | POA: Diagnosis not present

## 2023-11-05 DIAGNOSIS — L821 Other seborrheic keratosis: Secondary | ICD-10-CM | POA: Diagnosis not present

## 2023-11-05 DIAGNOSIS — Z85820 Personal history of malignant melanoma of skin: Secondary | ICD-10-CM | POA: Diagnosis not present

## 2023-11-05 DIAGNOSIS — D225 Melanocytic nevi of trunk: Secondary | ICD-10-CM | POA: Diagnosis not present

## 2023-11-05 DIAGNOSIS — D220 Melanocytic nevi of lip: Secondary | ICD-10-CM | POA: Diagnosis not present

## 2023-11-05 DIAGNOSIS — D1801 Hemangioma of skin and subcutaneous tissue: Secondary | ICD-10-CM | POA: Diagnosis not present

## 2023-11-07 NOTE — Telephone Encounter (Signed)
Pt calling back to state [name] [name] resulted sleep study and is getting cpap approved by insurance.

## 2023-11-07 NOTE — Telephone Encounter (Signed)
Patient is returning call. Transferred to [place], CMA.

## 2023-11-07 NOTE — Telephone Encounter (Signed)
Lvm to see if pt proceeded with the Sleep Study.

## 2023-11-15 ENCOUNTER — Telehealth: Payer: Self-pay

## 2023-11-15 ENCOUNTER — Encounter: Payer: Medicare Other | Admitting: Gastroenterology

## 2023-11-15 NOTE — Telephone Encounter (Signed)
[facility] Pre-operative Risk Assessment     Request for surgical clearance:     Endoscopy Procedure  What type of surgery is being performed?     Colonoscopy  When is this surgery scheduled?     12/19/23  What type of clearance is required ?   Pharmacy  Are there any medications that need to be held prior to surgery and how long? Plavix & 5 days  Practice name and name of physician performing surgery?      [facility]  What is your office phone and fax number?      Phone- [phone]  Fax- [phone]  Anesthesia type (None, local, MAC, general) ?       MAC   Please route your response to Abcde Oneil, CMA

## 2023-11-18 ENCOUNTER — Other Ambulatory Visit: Payer: Self-pay | Admitting: Pulmonary Disease

## 2023-11-18 NOTE — Telephone Encounter (Signed)
Pt must have an appointment for further refills.

## 2023-11-19 ENCOUNTER — Other Ambulatory Visit (HOSPITAL_COMMUNITY)
Admission: RE | Admit: 2023-11-19 | Discharge: 2023-11-19 | Disposition: A | Discharge status: Home / Self Care | Payer: Self-pay | Source: Ambulatory Visit | Attending: Oncology | Admitting: Oncology

## 2023-11-19 DIAGNOSIS — Z006 Encounter for examination for normal comparison and control in clinical research program: Secondary | ICD-10-CM | POA: Insufficient documentation

## 2023-11-20 DIAGNOSIS — H524 Presbyopia: Secondary | ICD-10-CM | POA: Diagnosis not present

## 2023-11-20 DIAGNOSIS — H26493 Other secondary cataract, bilateral: Secondary | ICD-10-CM | POA: Diagnosis not present

## 2023-11-20 DIAGNOSIS — H0100B Unspecified blepharitis left eye, upper and lower eyelids: Secondary | ICD-10-CM | POA: Diagnosis not present

## 2023-11-20 DIAGNOSIS — H0100A Unspecified blepharitis right eye, upper and lower eyelids: Secondary | ICD-10-CM | POA: Diagnosis not present

## 2023-11-20 DIAGNOSIS — H52203 Unspecified astigmatism, bilateral: Secondary | ICD-10-CM | POA: Diagnosis not present

## 2023-11-20 DIAGNOSIS — H04123 Dry eye syndrome of bilateral lacrimal glands: Secondary | ICD-10-CM | POA: Diagnosis not present

## 2023-11-26 LAB — GENECONNECT MOLECULAR SCREEN: Genetic Analysis Overall Interpretation: NEGATIVE

## 2023-12-10 DIAGNOSIS — H26492 Other secondary cataract, left eye: Secondary | ICD-10-CM | POA: Diagnosis not present

## 2023-12-15 ENCOUNTER — Encounter: Payer: Self-pay | Admitting: Certified Registered Nurse Anesthetist

## 2023-12-17 NOTE — Telephone Encounter (Signed)
 Duplicate, see other message and reply

## 2023-12-17 NOTE — Telephone Encounter (Signed)
 [name] [name], pls contact patient and reschedule her colonoscopy to a later date since she has an active thick productive cough. Patient should contact her PCP to evaluate her cough.   Dr. Lavon Paganini, [facility]

## 2023-12-17 NOTE — Telephone Encounter (Signed)
 Patient called and rescheduled colonoscopy for 1/31 at 2:00 PM.

## 2023-12-19 ENCOUNTER — Encounter: Payer: Medicare Other | Admitting: Gastroenterology

## 2023-12-20 NOTE — Telephone Encounter (Signed)
Spoke with pt. Pt was notified that with her current symptoms and our policy that she must reschedule. Procedure that was scheduled for the 01/03/2024 was cancelled. Pt notified that when her symptoms resolve then to call us back and that we will get her scheduled.  Pt verbalized understanding with all questions answered.

## 2023-12-21 ENCOUNTER — Other Ambulatory Visit: Payer: Self-pay | Admitting: Cardiovascular Disease

## 2023-12-21 DIAGNOSIS — I10 Essential (primary) hypertension: Secondary | ICD-10-CM

## 2023-12-21 DIAGNOSIS — E876 Hypokalemia: Secondary | ICD-10-CM

## 2023-12-21 DIAGNOSIS — I2510 Atherosclerotic heart disease of native coronary artery without angina pectoris: Secondary | ICD-10-CM

## 2023-12-30 DIAGNOSIS — G4733 Obstructive sleep apnea (adult) (pediatric): Secondary | ICD-10-CM

## 2023-12-30 DIAGNOSIS — I2510 Atherosclerotic heart disease of native coronary artery without angina pectoris: Secondary | ICD-10-CM

## 2023-12-30 DIAGNOSIS — I10 Essential (primary) hypertension: Secondary | ICD-10-CM

## 2023-12-30 DIAGNOSIS — R400 Somnolence: Secondary | ICD-10-CM

## 2023-12-30 NOTE — Telephone Encounter (Signed)
Prior Authorization for titration sent to  via web portal. Tracking Number. Prior Authorization/Notification is not required for the requested service(s). Decision ID #: [id number] -VALID DATES 02/21/2024-05/21/2024

## 2024-01-03 ENCOUNTER — Encounter: Payer: Medicare Other | Admitting: Gastroenterology

## 2024-01-10 DIAGNOSIS — E782 Mixed hyperlipidemia: Secondary | ICD-10-CM | POA: Diagnosis not present

## 2024-01-10 DIAGNOSIS — R7989 Other specified abnormal findings of blood chemistry: Secondary | ICD-10-CM | POA: Diagnosis not present

## 2024-01-19 ENCOUNTER — Other Ambulatory Visit: Payer: Self-pay | Admitting: Pulmonary Disease

## 2024-01-20 ENCOUNTER — Ambulatory Visit: Payer: Medicare Other | Admitting: Gastroenterology

## 2024-01-20 ENCOUNTER — Encounter: Payer: Self-pay | Admitting: Gastroenterology

## 2024-01-20 ENCOUNTER — Telehealth: Payer: Self-pay | Admitting: *Deleted

## 2024-01-20 VITALS — BP 130/84 | HR 58 | Ht 61.0 in | Wt 141.2 lb

## 2024-01-20 DIAGNOSIS — Z860101 Personal history of adenomatous and serrated colon polyps: Secondary | ICD-10-CM | POA: Diagnosis not present

## 2024-01-20 DIAGNOSIS — Z7902 Long term (current) use of antithrombotics/antiplatelets: Secondary | ICD-10-CM | POA: Diagnosis not present

## 2024-01-20 DIAGNOSIS — Z860100 Personal history of colon polyps, unspecified: Secondary | ICD-10-CM

## 2024-01-20 DIAGNOSIS — I2510 Atherosclerotic heart disease of native coronary artery without angina pectoris: Secondary | ICD-10-CM

## 2024-01-20 DIAGNOSIS — K219 Gastro-esophageal reflux disease without esophagitis: Secondary | ICD-10-CM | POA: Diagnosis not present

## 2024-01-20 DIAGNOSIS — Z87891 Personal history of nicotine dependence: Secondary | ICD-10-CM | POA: Diagnosis not present

## 2024-01-20 NOTE — Patient Instructions (Signed)
 You will be contacted by our office prior to your procedure for directions on holding your Plavix.  If you do not hear from our office 1 week prior to your scheduled procedure, please call [phone] to discuss.    You have been scheduled for a colonoscopy. Please follow written instructions given to you at your visit today.   If you use inhalers (even only as needed), please bring them with you on the day of your procedure.  DO NOT TAKE 7 DAYS PRIOR TO TEST- Trulicity (dulaglutide) Ozempic, Wegovy (semaglutide) Mounjaro (tirzepatide) Bydureon Bcise (exanatide extended release)  DO NOT TAKE 1 DAY PRIOR TO YOUR TEST Rybelsus (semaglutide) Adlyxin (lixisenatide) Victoza (liraglutide) Byetta (exanatide) ___________________________________________________________________________  If your blood pressure at your visit was 140/90 or greater, please contact your primary care physician to follow up on this.  _______________________________________________________  If you are age 65 or older, your body mass index should be between 23-30. Your Body mass index is 26.69 kg/m. If this is out of the aforementioned range listed, please consider follow up with your Primary Care Provider.  If you are age 42 or younger, your body mass index should be between 19-25. Your Body mass index is 26.69 kg/m. If this is out of the aformentioned range listed, please consider follow up with your Primary Care Provider.   ________________________________________________________  The [facility] providers would like to encourage you to use [facility] to communicate with providers for non-urgent requests or questions.  Due to long hold times on the telephone, sending your provider a message by [facility] may be a faster and more efficient way to get a response.  Please allow 48 business hours for a response.  Please remember that this is for non-urgent requests.   _______________________________________________________  Thank you for trusting me with your gastrointestinal care!   Boone Master, PA

## 2024-01-20 NOTE — Progress Notes (Signed)
 Chief Complaint: Recall colonoscopy Primary GI MD: Dr. Lavon Paganini  HPI: 78 year old [name] with a past medical history of anxiety, asthma, breast cancer, hypertension, hyperlipidemia, coronary artery disease s/p MI with DES to the LAD in 2004, ischemic cardiomyopathy 2004 with LV EF 60 - 65% per ECHO 06/2014, B12 deficiency, GERD, upper GI bleed secondary to gastric ulcers significant diverticular disease and colon polyps.  She is known by Dr. Lavon Paganini. S/P left total knee surgery 04/30/2023.  She presents today to discuss scheduling a colonoscopy.   Patient was originally scheduled for colonoscopy January 2025 and clearance was obtained to hold her Plavix.  She developed a respiratory illness and had to cancel as a result.  Since she was last seen October 2024 she was set up for repeat office visit prior to scheduling.    See note 09/25/24 with Alcide Evener, NP for further details  --------------------TODAY---------------------------  Patient is here today and reports no GI issues.  She is feeling great.  She has had resolution of her previous illness and has no upper respiratory symptoms.  She would like to reschedule her colonoscopy  PREVIOUS GI WORKUP   EGD 08/23/2021: - LA Grade B reflux esophagitis with no bleeding.  - Non-bleeding gastric ulcer with no stigmata of bleeding. Biopsied.  - A single gastric polyp. Biopsied.  - Normal examined duodenum. 1. Surgical [P], gastric antrum ulcer - GASTRIC ANTRAL MUCOSA SHOWING MARKED NONSPECIFIC REACTIVE GASTROPATHY WITH EROSION - WARTHIN STARRY STAIN IS NEGATIVE FOR HELICOBACTER PYLORI 2. Surgical [P], gastric cardia polyp - FUNDIC GLAND POLYP(S) - NEGATIVE FOR DYSPLASIA   EGD 06/21/2021 as an inpatient by Dr. Adela Lank: - LA Grade B reflux esophagitis with no bleeding. - Non-bleeding gastric ulcer with no stigmata of bleeding. Biopsied. - A single gastric polyp. Biopsied. - Normal examined duodenum.   Colonoscopy 05/02/2016: -  One 3 mm polyp in the ascending colon, removed with a cold biopsy forceps. Resected and retrieved.  - Severe Diverticulosis in the entire examined colon.  - Non-bleeding internal hemorrhoids. Surgical [P], ascending polyp - TUBULAR ADENOMA(S). - HIGH GRADE DYSPLASIA IS NOT IDENTIFIED.   Colonoscopy 03/21/2011: Sessile polyp  Moderate diverticulosis throughout the colon Otherwise normal examination  Past Medical History:  Diagnosis Date   Allergic rhinitis    Anemia    Anxiety disorder    Arthritis    Asthma    Blood transfusion without reported diagnosis    Breast cancer (HCC)    bilateral   precancerous had double mastectomy   CAD (coronary artery disease)    a. NSTEMI 8/04 => LHC 07/2003:  mLAD hazy 75% => Taxus DES, EF 30% with ant-apical, apical and inf-apical AK.  b.  Echo 4/05: EF 60%.  c.  ETT/Lexiscan Myoview 06/2011: EF 80%, no ischemia, normal wall motion   Cataract    Diverticulosis 11/13/2004   Family history of adverse reaction to anesthesia    Sister PONV   GERD (gastroesophageal reflux disease)    Hyperplastic colon polyp 10/16/1999   Hypertension    IBS (irritable bowel syndrome)    Internal and external hemorrhoids without complication 11/13/2004   MI (myocardial infarction) (HCC) 12/03/2002   Anteroapical MI with PCI to the LAD   PONV (postoperative nausea and vomiting)    Positive PPD    PVC (premature ventricular contraction)    Tuberculosis    Many years ago tested positive just did CXR 's    Past Surgical History:  Procedure Laterality Date   APPENDECTOMY  bilateral mastectomy     pt. was 78 years old   BIOPSY  06/21/2021   Procedure: BIOPSY;  Surgeon: Benancio Deeds, MD;  Location: [facility];  Service: Gastroenterology;;   CARDIOVASCULAR STRESS TEST  07/01/2006   ef 80%   CORONARY ANGIOPLASTY WITH STENT PLACEMENT  12/03/2002   LAD   ESOPHAGOGASTRODUODENOSCOPY N/A 06/21/2021   Procedure: ESOPHAGOGASTRODUODENOSCOPY (EGD);  Surgeon:  Benancio Deeds, MD;  Location: [facility];  Service: Gastroenterology;  Laterality: N/A;   knee srthroscopy Left    NASAL SINUS SURGERY Left 09/25/2021   Procedure: LEFT ENDOSCOPIC MAXILLARY ANTROSTOMY;  Surgeon: Serena Colonel, MD;  Location: [facility];  Service: ENT;  Laterality: Left;   TOTAL KNEE ARTHROPLASTY Left 04/30/2023   Procedure: TOTAL KNEE ARTHROPLASTY;  Surgeon: Durene Romans, MD;  Location: [facility];  Service: Orthopedics;  Laterality: Left;   US ECHOCARDIOGRAPHY  03/27/2004   resting ef 60%    Current Outpatient Medications  Medication Sig Dispense Refill   acetaminophen (TYLENOL) 500 MG tablet Take 500-1,000 mg by mouth every 6 (six) hours as needed for mild pain or headache.     albuterol (VENTOLIN HFA) 108 (90 Base) MCG/ACT inhaler Inhale 2 puffs into the lungs every 4 (four) hours as needed for shortness of breath.     amLODipine (NORVASC) 5 MG tablet Take 2.5 mg by mouth daily.     Calcium Carbonate (CALCIUM 600 PO) Take 600 mg by mouth daily.     clopidogrel (PLAVIX) 75 MG tablet TAKE ONE TABLET BY MOUTH ONCE DAILY 90 tablet 3   conjugated estrogens (PREMARIN) vaginal cream Place 0.5 Applicatorfuls vaginally 2 (two) times a week. On Tuesday and Saturday     Use as directed     denosumab (PROLIA) 60 MG/ML SOSY injection Inject 60 mg into the skin every 6 (six) months.     docusate sodium (COLACE) 50 MG capsule Take 50 mg by mouth daily as needed for mild constipation.     escitalopram (LEXAPRO) 10 MG tablet Take 10 mg by mouth daily.     fluticasone (FLONASE) 50 MCG/ACT nasal spray Place 1 spray into both nostrils 2 (two) times daily.     Fluticasone Furoate (ARNUITY ELLIPTA) 200 MCG/ACT AEPB Inhale 1 puff into the lungs daily. 30 each 1   irbesartan (AVAPRO) 150 MG tablet Take 1 tablet (150 mg total) by mouth daily. 90 tablet 3   ketotifen (ZADITOR) 0.025 % ophthalmic solution Place 1 drop into both eyes 2 (two) times daily.     loratadine (CLARITIN)  10 MG tablet Take 10 mg by mouth daily.     metroNIDAZOLE (METROGEL) 0.75 % gel Apply 1 Application topically daily.     nebivolol (BYSTOLIC) 5 MG tablet Take 1 tablet (5 mg total) by mouth daily. 90 tablet 2   pantoprazole (PROTONIX) 40 MG tablet TAKE ONE TABLET BY MOUTH ONCE DAILY 90 tablet 3   polyvinyl alcohol (LIQUIFILM TEARS) 1.4 % ophthalmic solution Place 1 drop into both eyes 2 (two) times daily.     potassium chloride SA (KLOR-CON M20) 20 MEQ tablet Take 1 tablet (20 mEq total) by mouth daily. 90 tablet 3   rosuvastatin (CRESTOR) 5 MG tablet Take 1 tablet (5 mg total) by mouth daily. 30 tablet 6   Simethicone (GAS-X MAXIMUM STRENGTH PO) Take 250 mg by mouth daily as needed (gas).     spironolactone (ALDACTONE) 25 MG tablet Take 1 tablet (25 mg total) by mouth daily. 90 tablet 2  amLODipine (NORVASC) 5 MG tablet Take 1 tablet (5 mg total) by mouth daily. 90 tablet 3   Current Facility-Administered Medications  Medication Dose Route Frequency Provider Last Rate Last Admin   0.9 %  sodium chloride infusion  500 mL Intravenous Once Napoleon Form, MD        Allergies as of 01/20/2024 - Review Complete 01/20/2024  Allergen Reaction Noted   Clindamycin Diarrhea and Nausea And Vomiting 06/21/2021   Asa [aspirin] Other (See Comments) 09/15/2021   Codeine Other (See Comments) 02/09/2014   Lisinopril Cough 02/28/2011   Simvastatin Diarrhea 02/28/2011   Cephalexin Other (See Comments) and Rash 07/06/2022    Family History  Problem Relation Age of Onset   Breast cancer Mother    Breast cancer Sister    Heart disease Maternal Grandmother    Asthma Daughter    Spina bifida Other    Colon cancer Neg Hx    Esophageal cancer Neg Hx    Rectal cancer Neg Hx    Stomach cancer Neg Hx     Social History   Socioeconomic History   Marital status: Divorced    Spouse name: Not on file   Number of children: Not on file   Years of education: Not on file   Highest education level:  Not on file  Occupational History   Occupation: retired  Tobacco Use   Smoking status: Former    Current packs/day: 0.00    Types: Cigarettes    Quit date: 08/07/1966    Years since quitting: 57.4   Smokeless tobacco: Never  Vaping Use   Vaping status: Never Used  Substance and Sexual Activity   Alcohol use: Not Currently    Comment: 2 glasses wina a year   Drug use: No   Sexual activity: Not Currently    Birth control/protection: Post-menopausal  Other Topics Concern   Not on file  Social History Narrative   Not on file   Social Drivers of Health   Financial Resource Strain: Not on file  Food Insecurity: No Food Insecurity (04/30/2023)   Hunger Vital Sign    Worried About Running Out of Food in the Last Year: Never true    Ran Out of Food in the Last Year: Never true  Transportation Needs: No Transportation Needs (04/30/2023)   PRAPARE - Administrator, Civil Service (Medical): No    Lack of Transportation (Non-Medical): No  Physical Activity: Not on file  Stress: Not on file  Social Connections: Not on file  Intimate Partner Violence: Not At Risk (04/30/2023)   Humiliation, Afraid, Rape, and Kick questionnaire    Fear of Current or Ex-Partner: No    Emotionally Abused: No    Physically Abused: No    Sexually Abused: No    Review of Systems:    Constitutional: No weight loss, fever, chills, weakness or fatigue HEENT: Eyes: No change in vision               Ears, Nose, Throat:  No change in hearing or congestion Skin: No rash or itching Cardiovascular: No chest pain, chest pressure or palpitations   Respiratory: No SOB or cough Gastrointestinal: See HPI and otherwise negative Genitourinary: No dysuria or change in urinary frequency Neurological: No headache, dizziness or syncope Musculoskeletal: No new muscle or joint pain Hematologic: No bleeding or bruising Psychiatric: No history of depression or anxiety    Physical Exam:  Vital signs: BP 130/84    Pulse (!) [name]  Ht 5\' 1"  (1.549 m)   Wt 141 lb 4 oz (64.1 kg)   BMI 26.69 kg/m   Constitutional: NAD, Well developed, Well nourished, alert and cooperative Head:  Normocephalic and atraumatic. Eyes:   PEERL, EOMI. No icterus. Conjunctiva pink. Respiratory: Respirations even and unlabored. Lungs clear to auscultation bilaterally.   No wheezes, crackles, or rhonchi.  Cardiovascular:  Regular rate and rhythm. No peripheral edema, cyanosis or pallor.  Rectal:  Not performed.  Msk:  Symmetrical without gross deformities. Without edema, no deformity or joint abnormality.  Neurologic:  Alert and  oriented x4;  grossly normal neurologically.  Skin:   Dry and intact without significant lesions or rashes. Psychiatric: Oriented to person, place and time. Demonstrates good judgement and reason without abnormal affect or behaviors.   RELEVANT LABS AND IMAGING: CBC    Component Value Date/Time   WBC 7.0 09/26/2023 1547   RBC 3.82 (L) 09/26/2023 1547   HGB 12.0 09/26/2023 1547   HCT 36.6 09/26/2023 1547   PLT 344.0 09/26/2023 1547   MCV 95.8 09/26/2023 1547   MCH 31.9 05/01/2023 0339   MCHC 32.8 09/26/2023 1547   RDW 14.0 09/26/2023 1547   LYMPHSABS 1.1 10/19/2022 1456   MONOABS 0.6 10/19/2022 1456   EOSABS 0.1 10/19/2022 1456   BASOSABS 0.0 10/19/2022 1456    CMP     Component Value Date/Time   NA 136 05/01/2023 0339   NA 141 01/28/2023 1133   K 4.9 05/01/2023 0339   CL 110 05/01/2023 0339   CO2 22 05/01/2023 0339   GLUCOSE 198 (H) 05/01/2023 0339   BUN 18 05/01/2023 0339   BUN 23 01/28/2023 1133   CREATININE 1.00 05/01/2023 0339   CALCIUM 7.2 (L) 05/01/2023 0339   PROT 6.9 10/19/2022 1456   ALBUMIN 4.1 10/19/2022 1456   AST 17 10/19/2022 1456   ALT 11 10/19/2022 1456   ALKPHOS 70 10/19/2022 1456   BILITOT 0.7 10/19/2022 1456   GFRNONAA [name] (L) 05/01/2023 0339   GFRAA 75 (L) 05/17/2014 1350     Assessment/Plan:   History of colon polyps Last colonoscopy 2017 with 3  mm tubular adenoma removed from the ascending colon and severe diverticulosis.  No family history.  CTAP 10/2022 with pandiverticulosis.  No GI symptoms at this time.  She was originally scheduled January 2025 but due to upper respiratory illness this had to be pushed back and so she made a repeat office visit for rescheduling - Schedule colonoscopy - I thoroughly discussed the procedure with the patient (at bedside) to include nature of the procedure, alternatives, benefits, and risks (including but not limited to bleeding, infection, perforation, anesthesia/cardiac pulmonary complications).  Patient verbalized understanding and gave verbal consent to proceed with procedure. - Will hold Plavix 5 days prior to endoscopic procedures - will instruct when and how to resume after procedure. Benefits and risks of procedure explained including risks of bleeding, perforation, infection, missed lesions, reactions to medications and possible need for hospitalization and surgery for complications. Additional rare but real risk of stroke or other vascular clotting events off Plavix also explained and need to seek urgent help if any signs of these problems occur. Will communicate by phone or EMR with patient's  prescribing provider to confirm that holding Plavix is reasonable in this case.    History of GERD secondary to upper GI bleed and gastric ulcer 06/2021 - Continue pantoprazole 40 Mg daily  CAD s/p MI and DES 2004 on Plavix Will get cardiac clearance to  hold Plavix for 5 days  Rommel Hogston Jolee Ewing [facility] 01/20/2024, 12:55 PM  Cc: Gweneth Dimitri, MD

## 2024-01-23 NOTE — Telephone Encounter (Signed)
   Name: [name] [name]  DOB: [birth year]  MRN: [id number]  Primary Cardiologist: Kristeen Miss, MD   Preoperative team, please contact this patient and set up a phone call appointment for further preoperative risk assessment. Please obtain consent and complete medication review. Thank you for your help. Last seen by Jari Favre on 10/29/2023  I confirm that guidance regarding antiplatelet and oral anticoagulation therapy has been completed and, if necessary, noted below.  Per office protocol, if patient is without any new symptoms or concerns at the time of their virtual visit, he/she may hold Plavix for 5 days prior to procedure. Please resume Plavix as soon as possible postprocedure, at the discretion of the surgeon.    I also confirmed the patient resides in the state of [place]. As per [facility] Medical Board telemedicine laws, the patient must reside in the state in which the provider is licensed.   Joni Reining, NP 01/23/2024, 3:43 PM [facility]

## 2024-01-23 NOTE — Telephone Encounter (Signed)
 [facility] Pre-operative Risk Assessment     Request for surgical clearance:     Endoscopy Procedure  What type of surgery is being performed?     colonoscopy  When is this surgery scheduled?     02/18/24  What type of clearance is required ?   Pharmacy  Are there any medications that need to be held prior to surgery and how long? Plavix 5 days  Practice name and name of physician performing surgery?      [facility]  What is your office phone and fax number?      Phone- [phone]  Fax- [phone]  Anesthesia type (None, local, MAC, general) ?       MAC   Please route your response to Cristela Felt, CMA

## 2024-01-25 ENCOUNTER — Other Ambulatory Visit: Payer: Self-pay | Admitting: Gastroenterology

## 2024-02-03 ENCOUNTER — Telehealth: Payer: Self-pay | Admitting: Cardiovascular Disease

## 2024-02-03 ENCOUNTER — Telehealth: Payer: Self-pay | Admitting: *Deleted

## 2024-02-03 DIAGNOSIS — M810 Age-related osteoporosis without current pathological fracture: Secondary | ICD-10-CM | POA: Diagnosis not present

## 2024-02-03 NOTE — Telephone Encounter (Signed)
 Please update clearance request.

## 2024-02-03 NOTE — Telephone Encounter (Signed)
  Pt has been scheduled tele preop appt 02/12/24. Med rec and consent are done.     Patient Consent for Virtual Visit        [name] [name] has provided verbal consent on 02/03/2024 for a virtual visit (video or telephone).   CONSENT FOR VIRTUAL VISIT FOR:  [name] [name]  By participating in this virtual visit I agree to the following:  I hereby voluntarily request, consent and authorize [facility] and its employed or contracted physicians, physician assistants, nurse practitioners or other licensed health care professionals (the Practitioner), to provide me with telemedicine health care services (the "Services") as deemed necessary by the treating Practitioner. I acknowledge and consent to receive the Services by the Practitioner via telemedicine. I understand that the telemedicine visit will involve communicating with the Practitioner through live audiovisual communication technology and the disclosure of certain medical information by electronic transmission. I acknowledge that I have been given the opportunity to request an in-person assessment or other available alternative prior to the telemedicine visit and am voluntarily participating in the telemedicine visit.  I understand that I have the right to withhold or withdraw my consent to the use of telemedicine in the course of my care at any time, without affecting my right to future care or treatment, and that the Practitioner or I may terminate the telemedicine visit at any time. I understand that I have the right to inspect all information obtained and/or recorded in the course of the telemedicine visit and may receive copies of available information for a reasonable fee.  I understand that some of the potential risks of receiving the Services via telemedicine include:  Delay or interruption in medical evaluation due to technological equipment failure or disruption; Information transmitted may not be sufficient (e.g. poor  resolution of images) to allow for appropriate medical decision making by the Practitioner; and/or  In rare instances, security protocols could fail, causing a breach of personal health information.  Furthermore, I acknowledge that it is my responsibility to provide information about my medical history, conditions and care that is complete and accurate to the best of my ability. I acknowledge that Practitioner's advice, recommendations, and/or decision may be based on factors not within their control, such as incomplete or inaccurate data provided by me or distortions of diagnostic images or specimens that may result from electronic transmissions. I understand that the practice of medicine is not an exact science and that Practitioner makes no warranties or guarantees regarding treatment outcomes. I acknowledge that a copy of this consent can be made available to me via my patient portal [facility] MyChart), or I can request a printed copy by calling the office of [facility].    I understand that my insurance will be billed for this visit.   I have read or had this consent read to me. I understand the contents of this consent, which adequately explains the benefits and risks of the Services being provided via telemedicine.  I have been provided ample opportunity to ask questions regarding this consent and the Services and have had my questions answered to my satisfaction. I give my informed consent for the services to be provided through the use of telemedicine in my medical care

## 2024-02-03 NOTE — Telephone Encounter (Signed)
 Operator Shanell B. Sent me a secure chat stating pt calling back to schedule preop tele appt, though I was not available as I was with another pt.  I called the pt and left vm to call back.

## 2024-02-03 NOTE — Telephone Encounter (Signed)
 Patient that pre opp called to set up a phone appt for her clearance. Advise patient I dont see where anyone called. Tried to schedule office visit for patient but she insists that clearance needs to be done over the phone. Please advise

## 2024-02-03 NOTE — Telephone Encounter (Signed)
 Threasa Alpha A3 minutes ago (4:58 PM)   SS Patient that pre opp called to set up a phone appt for her clearance. Advise patient I dont see where anyone called. Tried to schedule office visit for patient but she insists that clearance needs to be done over the phone. Please advise       Note   Lyndsie, [place] Serayah Yazdani" [phone]  Threasa Alpha A    Pt has been scheduled tele preop appt 02/12/24. Med rec and consent are done.

## 2024-02-03 NOTE — Telephone Encounter (Signed)
 Left message to call back to schedule tele pre op appt.

## 2024-02-03 NOTE — Telephone Encounter (Signed)
   Name: [name] [name]  DOB: 1946/10/24  MRN: [id number]  Primary Cardiologist: Kristeen Miss, MD   Preoperative team, please contact this patient and set up a phone call appointment for further preoperative risk assessment. Please obtain consent and complete medication review. Thank you for your help.  I confirm that guidance regarding antiplatelet and oral anticoagulation therapy has been completed and, if necessary, noted below.  Per office protocol, if patient is without any new symptoms or concerns at the time of their virtual visit, he/she may hold Plavix for 5 days prior to procedure. Please resume Plavix as soon as possible postprocedure, at the discretion of the surgeon.  Patient should take aspirin 81 mg throughout the perioperative period.  Please discontinue aspirin upon resumption of Plavix.  I also confirmed the patient resides in the state of [place]. As per [facility] Medical Board telemedicine laws, the patient must reside in the state in which the provider is licensed.   Joylene Grapes, NP 02/03/2024, 3:12 PM [facility]

## 2024-02-04 ENCOUNTER — Other Ambulatory Visit: Payer: Self-pay | Admitting: Cardiovascular Disease

## 2024-02-04 DIAGNOSIS — I10 Essential (primary) hypertension: Secondary | ICD-10-CM

## 2024-02-04 DIAGNOSIS — E876 Hypokalemia: Secondary | ICD-10-CM

## 2024-02-04 DIAGNOSIS — I2510 Atherosclerotic heart disease of native coronary artery without angina pectoris: Secondary | ICD-10-CM

## 2024-02-10 ENCOUNTER — Encounter: Payer: Self-pay | Admitting: Gastroenterology

## 2024-02-11 ENCOUNTER — Telehealth: Payer: Self-pay

## 2024-02-11 NOTE — Telephone Encounter (Signed)
**Note De-Identified Arty Lantzy Obfuscation** Per the [facility] Provider Portal: 16109 (CPAP Titration) Description Polysomnography; age 80 years or older, sleep staging with 4 or more additional parameters of sleep, with initiation of continuous positive airway pressure therapy or bilevel ventilation, attended by a technologist Inquiry summary Notification/Prior Authorization not required for this service.  I have transferred the CPAP Titration order to the sleep lab so they can contact the pt to schedule the test.

## 2024-02-12 ENCOUNTER — Ambulatory Visit: Attending: Physician Assistant | Admitting: Physician Assistant

## 2024-02-12 DIAGNOSIS — Z01810 Encounter for preprocedural cardiovascular examination: Secondary | ICD-10-CM

## 2024-02-12 NOTE — Progress Notes (Signed)
 Left message for patient to call office.

## 2024-02-12 NOTE — Progress Notes (Signed)
   Virtual Visit via Telephone Note   Because of [name] [name] co-morbid illnesses, she is at least at moderate risk for complications without adequate follow up.  This format is felt to be most appropriate for this patient at this time.  Due to technical limitations with video connection (technology), today's appointment will be conducted as an audio only telehealth visit, and [name] [name] verbally agreed to proceed in this manner.   All issues noted in this document were discussed and addressed.  No physical exam could be performed with this format.  Evaluation Performed:  Preoperative cardiovascular risk assessment _____________   Date:  02/12/2024   Patient ID:  [name] [name], DOB 07/29/1946, MRN [id number] Patient Location:  Home Provider location:   Office  Primary Care Provider:  Gweneth Dimitri, MD Primary Cardiologist:  Kristeen Miss, MD  Chief Complaint / Patient Profile   78 y.o. y/o female with a h/o  Coronary artery disease NSTEMI in 07/2003 s/p 3 x 20 mm Taxus DES to LAD Myoview in 2012: low risk  Ischemic CM Echo 07/2003: EF 30, apical and inf-apical AK Echo 7/15: EF 60-65 Hyperlipidemia  Hypertension  Asthma  Hx of Breast CA   who is pending endoscopy under conscious sedation on 02/18/2024 and presents today for telephonic preoperative cardiovascular risk assessment.  It has been requested that Plavix be held for 5 days.  History of Present Illness    [name] [name] is a 78 y.o. female who presents via Web designer for a telehealth visit today.  Pt was last seen in cardiology clinic on 08/01/23 by Eligha Bridegroom, NP.  At that time [name] [name] was doing well.  The patient is now pending procedure as outlined above. Since her last visit, she has done well without chest discomfort, shortness of breath, syncope.  Physical Exam    Vital Signs:  [name] [name] does not have vital signs available for review today.  Given telephonic nature  of communication, physical exam is limited. AAOx3. NAD. Normal affect.  Speech and respirations are unlabored.  Accessory Clinical Findings    None  Assessment & Plan    Assessment & Plan Preoperative cardiovascular examination [name] [name]'s perioperative risk of a major cardiac event is 0.9% according to the Revised Cardiac Risk Index (RCRI).  Therefore, she is at low risk for perioperative complications.   Her functional capacity is good at 4.31 METs according to the Duke Activity Status Index (DASI). Recommendations: According to ACC/AHA guidelines, no further cardiovascular testing needed.  The patient may proceed to surgery at acceptable risk.   Antiplatelet and/or Anticoagulation Recommendations: Clopidogrel (Plavix) can be held for 5 days prior to her surgery and resumed as soon as possible post op.   The patient was advised that if she develops new symptoms prior to surgery to contact our office to arrange for a follow-up visit, and she verbalized understanding.  A copy of this note will be routed to requesting surgeon.  Time:   Today, I have spent 5 minutes with the patient with telehealth technology discussing medical history, symptoms, and management plan.    Tereso Newcomer, PA-C 02/12/2024, 10:44 AM

## 2024-02-12 NOTE — Telephone Encounter (Signed)
 Notes sent to surgeon.  Tereso Newcomer, PA-C  02/12/2024 1:59 PM

## 2024-02-13 DIAGNOSIS — N3000 Acute cystitis without hematuria: Secondary | ICD-10-CM | POA: Diagnosis not present

## 2024-02-13 NOTE — Progress Notes (Signed)
 Spoke with patient and verified she has held Plavix.

## 2024-02-13 NOTE — Telephone Encounter (Signed)
 Spoke with patient and verified she has held Plavix.

## 2024-02-18 ENCOUNTER — Encounter: Payer: Self-pay | Admitting: Gastroenterology

## 2024-02-18 ENCOUNTER — Ambulatory Visit: Payer: Medicare Other | Admitting: Gastroenterology

## 2024-02-18 VITALS — BP 126/63 | HR 57 | Temp 97.3°F | Resp 12 | Ht 61.0 in | Wt 141.0 lb

## 2024-02-18 DIAGNOSIS — Z1211 Encounter for screening for malignant neoplasm of colon: Secondary | ICD-10-CM

## 2024-02-18 DIAGNOSIS — K5730 Diverticulosis of large intestine without perforation or abscess without bleeding: Secondary | ICD-10-CM | POA: Diagnosis not present

## 2024-02-18 DIAGNOSIS — Z860101 Personal history of adenomatous and serrated colon polyps: Secondary | ICD-10-CM | POA: Diagnosis not present

## 2024-02-18 DIAGNOSIS — D122 Benign neoplasm of ascending colon: Secondary | ICD-10-CM

## 2024-02-18 DIAGNOSIS — K644 Residual hemorrhoidal skin tags: Secondary | ICD-10-CM | POA: Diagnosis not present

## 2024-02-18 DIAGNOSIS — K648 Other hemorrhoids: Secondary | ICD-10-CM

## 2024-02-18 DIAGNOSIS — Z860100 Personal history of colon polyps, unspecified: Secondary | ICD-10-CM

## 2024-02-18 DIAGNOSIS — I493 Ventricular premature depolarization: Secondary | ICD-10-CM | POA: Diagnosis not present

## 2024-02-18 MED ORDER — SODIUM CHLORIDE 0.9 % IV SOLN
500.0000 mL | Freq: Once | INTRAVENOUS | Status: DC
Start: 2024-02-18 — End: 2024-02-18

## 2024-02-18 NOTE — Patient Instructions (Signed)

## 2024-02-18 NOTE — Op Note (Addendum)
 [facility] Patient Name: [name] [name] Procedure Date: 02/18/2024 2:46 PM MRN: [id number] Endoscopist: Napoleon Form , MD, [id number] Age: 78 Referring MD:  Date of Birth: 02-Jul-1946 Gender: Female Account #: [id number] Procedure:                Colonoscopy Indications:              High risk colon cancer surveillance: Personal                            history of colonic polyps Medicines:                Monitored Anesthesia Care Procedure:                Pre-Anesthesia Assessment:                           - Prior to the procedure, a History and Physical                            was performed, and patient medications and                            allergies were reviewed. The patient's tolerance of                            previous anesthesia was also reviewed. The risks                            and benefits of the procedure and the sedation                            options and risks were discussed with the patient.                            All questions were answered, and informed consent                            was obtained. Prior Anticoagulants: The patient                            last took Plavix (clopidogrel) 5 days prior to the                            procedure. ASA Grade Assessment: III - A patient                            with severe systemic disease. After reviewing the                            risks and benefits, the patient was deemed in                            satisfactory condition to undergo the procedure.  After obtaining informed consent, the colonoscope                            was passed under direct vision. Throughout the                            procedure, the patient's blood pressure, pulse, and                            oxygen saturations were monitored continuously. The                            Olympus Scope [id number] was introduced through the                            anus and  advanced to the the cecum, identified by                            appendiceal orifice and ileocecal valve. The                            colonoscopy was performed without difficulty. The                            patient tolerated the procedure well. The quality                            of the bowel preparation was adequate. The                            ileocecal valve, appendiceal orifice, and rectum                            were photographed. Scope In: 2:59:39 PM Scope Out: 3:15:35 PM Scope Withdrawal Time: 0 hours 6 minutes 22 seconds  Total Procedure Duration: 0 hours 15 minutes 56 seconds  Findings:                 The perianal and digital rectal examinations were                            normal.                           Two sessile polyps were found in the ascending                            colon. The polyps were 5 to 7 mm in size. These                            polyps were removed with a cold snare. Resection                            and retrieval were complete.  Scattered large-mouthed, medium-mouthed and                            small-mouthed diverticula were found in the sigmoid                            colon, descending colon, transverse colon and                            ascending colon.                           Non-bleeding external and internal hemorrhoids were                            found during retroflexion. The hemorrhoids were                            medium-sized. Complications:            No immediate complications. Estimated Blood Loss:     Estimated blood loss was minimal. Impression:               - Two 5 to 7 mm polyps in the ascending colon,                            removed with a cold snare. Resected and retrieved.                           - Severe diverticulosis in the sigmoid colon, in                            the descending colon, in the transverse colon and                            in the  ascending colon.                           - Non-bleeding external and internal hemorrhoids. Recommendation:           - Patient has a contact number available for                            emergencies. The signs and symptoms of potential                            delayed complications were discussed with the                            patient. Return to normal activities tomorrow.                            Written discharge instructions were provided to the                            patient.                           -  Resume previous diet.                           - Continue present medications.                           - Await pathology results.                           - No repeat colonoscopy due to age.                           - Resume Plavix (clopidogrel) at prior dose                            tomorrow. Napoleon Form, MD 02/18/2024 3:22:10 PM This report has been signed electronically.

## 2024-02-18 NOTE — Progress Notes (Unsigned)
 A/o x 3, VSS, gd SR's, pleased with anesthesia, report to RN

## 2024-02-18 NOTE — Progress Notes (Unsigned)
 Updated medical record.

## 2024-02-18 NOTE — Progress Notes (Signed)
 Called to room to assist during endoscopic procedure.  Patient ID and intended procedure confirmed with present staff. Received instructions for my participation in the procedure from the performing physician.

## 2024-02-18 NOTE — Progress Notes (Unsigned)
 [facility] History and Physical   Primary Care Physician:  Gweneth Dimitri, MD   Reason for Procedure:  History of adenomatous colon polyps  Plan:    Surveillance colonoscopy with possible interventions as needed     HPI: [name] [name] is a very pleasant 78 y.o. female here for surveillance colonoscopy. Denies any nausea, vomiting, abdominal pain, melena or bright red blood per rectum  The risks and benefits as well as alternatives of endoscopic procedure(s) have been discussed and reviewed. All questions answered. The patient agrees to proceed.    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 NSTEMI 8/04 => LHC 07/2003:  mLAD hazy 75% => Taxus DES, EF 30% with ant-apical, apical and inf-apical AK.  b.  Echo 4/05: EF 60%.  c.  ETT/Lexiscan Myoview 06/2011: EF 80%, no ischemia, normal wall motion   Cataract    Diverticulosis 11/13/2004   Family history of adverse reaction to anesthesia    Sister PONV   GERD (gastroesophageal reflux disease)    Hyperplastic colon polyp 10/16/1999   Hypertension    IBS (irritable bowel syndrome)    Internal and external hemorrhoids without complication 11/13/2004   MI (myocardial infarction) (HCC) 12/03/2002   Anteroapical MI with PCI to the LAD   PONV (postoperative nausea and vomiting)    Positive PPD    PVC (premature ventricular contraction)    Tuberculosis    Many years ago tested positive just did CXR 's    Past Surgical History:  Procedure Laterality Date   APPENDECTOMY     bilateral mastectomy     pt. was 78 years old   BIOPSY  06/21/2021   Procedure: BIOPSY;  Surgeon: Benancio Deeds, MD;  Location: [facility];  Service: Gastroenterology;;   CARDIOVASCULAR STRESS TEST  07/01/2006    ef 80%   COLONOSCOPY  2017   CORONARY ANGIOPLASTY WITH STENT PLACEMENT  12/03/2002   LAD   ESOPHAGOGASTRODUODENOSCOPY N/A 06/21/2021   Procedure: ESOPHAGOGASTRODUODENOSCOPY (EGD);  Surgeon: Benancio Deeds, MD;  Location: [facility];  Service: Gastroenterology;  Laterality: N/A;   knee srthroscopy Left    NASAL SINUS SURGERY Left 09/25/2021   Procedure: LEFT ENDOSCOPIC MAXILLARY ANTROSTOMY;  Surgeon: Serena Colonel, MD;  Location: [facility];  Service: ENT;  Laterality: Left;   TOTAL KNEE ARTHROPLASTY Left 04/30/2023   Procedure: TOTAL KNEE ARTHROPLASTY;  Surgeon: Durene Romans, MD;  Location: [facility];  Service: Orthopedics;  Laterality: Left;   US ECHOCARDIOGRAPHY  03/27/2004   resting ef 60%    Prior to Admission medications   Medication Sig Start Date End Date Taking? Authorizing Provider  acetaminophen (TYLENOL) 500 MG tablet Take 500-1,000 mg by mouth every 6 (six) hours as needed for mild pain or headache.   Yes [provider]  albuterol (VENTOLIN HFA) 108 (90 Base) MCG/ACT inhaler Inhale 2 puffs into the lungs every 4 (four) hours as needed for shortness of breath.   Yes [provider]  conjugated estrogens (PREMARIN) vaginal cream Place 0.5 Applicatorfuls vaginally 2 (two) times a week. On Tuesday and Saturday     Use as directed 05/03/11  Yes [provider]  escitalopram (LEXAPRO) 10 MG tablet  Take 10 mg by mouth daily.   Yes [provider]  fluticasone (FLONASE) 50 MCG/ACT nasal spray Place 1 spray into both nostrils 2 (two) times daily. 01/08/14  Yes [provider]  Fluticasone Furoate (ARNUITY ELLIPTA) 200 MCG/ACT AEPB Inhale 1 puff into the lungs daily. 11/18/23  Yes Olalere, Adewale A, MD  irbesartan (AVAPRO) 150 MG tablet Take 1 tablet (150 mg total) by mouth daily. 04/18/23  Yes Nahser, Deloris Ping, MD  ketotifen (ZADITOR) 0.025 % ophthalmic solution Place 1 drop into both eyes 2 (two) times daily.   Yes  [provider]  loratadine (CLARITIN) 10 MG tablet Take 10 mg by mouth daily.   Yes [provider]  nebivolol (BYSTOLIC) 5 MG tablet Take 1 tablet (5 mg total) by mouth daily. 12/24/23  Yes Nahser, Deloris Ping, MD  pantoprazole (PROTONIX) 40 MG tablet TAKE ONE TABLET BY MOUTH ONCE DAILY 01/27/24  Yes Chanze Teagle, Eleonore Chiquito, MD  potassium chloride SA (KLOR-CON M) 20 MEQ tablet Take 1 tablet (20 mEq total) by mouth daily. 02/06/24  Yes Nahser, Deloris Ping, MD  rosuvastatin (CRESTOR) 5 MG tablet Take 1 tablet (5 mg total) by mouth daily. 01/13/13  Yes Nahser, Deloris Ping, MD  Simethicone (GAS-X MAXIMUM STRENGTH PO) Take 250 mg by mouth daily as needed (gas).   Yes [provider]  spironolactone (ALDACTONE) 25 MG tablet Take 1 tablet (25 mg total) by mouth daily. 12/24/23  Yes Nahser, Deloris Ping, MD  sulfamethoxazole-trimethoprim (BACTRIM DS) 800-160 MG tablet Take 1 tablet by mouth 2 (two) times daily. 02/13/24  Yes [provider]  Calcium Carbonate (CALCIUM 600 PO) Take 600 mg by mouth daily.    [provider]  clopidogrel (PLAVIX) 75 MG tablet TAKE ONE TABLET BY MOUTH ONCE DAILY 09/24/23   Nahser, Deloris Ping, MD  denosumab (PROLIA) 60 MG/ML SOSY injection Inject 60 mg into the skin every 6 (six) months.    [provider]  docusate sodium (COLACE) 50 MG capsule Take 50 mg by mouth daily as needed for mild constipation.    [provider]  metroNIDAZOLE (METROGEL) 0.75 % gel Apply 1 Application topically daily. 11/17/22   [provider]  polyvinyl alcohol (LIQUIFILM TEARS) 1.4 % ophthalmic solution Place 1 drop into both eyes 2 (two) times daily.    [provider]    Current Outpatient Medications  Medication Sig Dispense Refill   acetaminophen (TYLENOL) 500 MG tablet Take 500-1,000 mg by mouth every 6 (six) hours as needed for mild pain or headache.     albuterol (VENTOLIN HFA) 108 (90 Base) MCG/ACT inhaler Inhale 2 puffs into the  lungs every 4 (four) hours as needed for shortness of breath.     conjugated estrogens (PREMARIN) vaginal cream Place 0.5 Applicatorfuls vaginally 2 (two) times a week. On Tuesday and Saturday     Use as directed     escitalopram (LEXAPRO) 10 MG tablet Take 10 mg by mouth daily.     fluticasone (FLONASE) 50 MCG/ACT nasal spray Place 1 spray into both nostrils 2 (two) times daily.     Fluticasone Furoate (ARNUITY ELLIPTA) 200 MCG/ACT AEPB Inhale 1 puff into the lungs daily. 30 each 1   irbesartan (AVAPRO) 150 MG tablet Take 1 tablet (150 mg total) by mouth daily. 90 tablet 3   ketotifen (ZADITOR) 0.025 % ophthalmic solution Place 1 drop into both eyes 2 (two) times daily.     loratadine (CLARITIN) 10 MG tablet Take 10 mg by  mouth daily.     nebivolol (BYSTOLIC) 5 MG tablet Take 1 tablet (5 mg total) by mouth daily. 90 tablet 2   pantoprazole (PROTONIX) 40 MG tablet TAKE ONE TABLET BY MOUTH ONCE DAILY 90 tablet 3   potassium chloride SA (KLOR-CON M) 20 MEQ tablet Take 1 tablet (20 mEq total) by mouth daily. 90 tablet 1   rosuvastatin (CRESTOR) 5 MG tablet Take 1 tablet (5 mg total) by mouth daily. 30 tablet 6   Simethicone (GAS-X MAXIMUM STRENGTH PO) Take 250 mg by mouth daily as needed (gas).     spironolactone (ALDACTONE) 25 MG tablet Take 1 tablet (25 mg total) by mouth daily. 90 tablet 2   sulfamethoxazole-trimethoprim (BACTRIM DS) 800-160 MG tablet Take 1 tablet by mouth 2 (two) times daily.     Calcium Carbonate (CALCIUM 600 PO) Take 600 mg by mouth daily.     clopidogrel (PLAVIX) 75 MG tablet TAKE ONE TABLET BY MOUTH ONCE DAILY 90 tablet 3   denosumab (PROLIA) 60 MG/ML SOSY injection Inject 60 mg into the skin every 6 (six) months.     docusate sodium (COLACE) 50 MG capsule Take 50 mg by mouth daily as needed for mild constipation.     metroNIDAZOLE (METROGEL) 0.75 % gel Apply 1 Application topically daily.     polyvinyl alcohol (LIQUIFILM TEARS) 1.4 % ophthalmic solution Place 1 drop into  both eyes 2 (two) times daily.     Current Facility-Administered Medications  Medication Dose Route Frequency Provider Last Rate Last Admin   0.9 %  sodium chloride infusion  500 mL Intravenous Once Napoleon Form, MD        Allergies as of 02/18/2024 - Review Complete 02/12/2024  Allergen Reaction Noted   Clindamycin Diarrhea and Nausea And Vomiting 06/21/2021   Asa [aspirin] Other (See Comments) 09/15/2021   Codeine Other (See Comments) 02/09/2014   Lisinopril Cough 02/28/2011   Simvastatin Diarrhea 02/28/2011   Cephalexin Other (See Comments) and Rash 07/06/2022    Family History  Problem Relation Age of Onset   Breast cancer Mother    Breast cancer Sister    Heart disease Maternal Grandmother    Asthma Daughter    Spina bifida Other    Colon cancer Neg Hx    Esophageal cancer Neg Hx    Rectal cancer Neg Hx    Stomach cancer Neg Hx     Social History   Socioeconomic History   Marital status: Divorced    Spouse name: Not on file   Number of children: Not on file   Years of education: Not on file   Highest education level: Not on file  Occupational History   Occupation: retired  Tobacco Use   Smoking status: Former    Current packs/day: 0.00    Types: Cigarettes    Quit date: 08/07/1966    Years since quitting: 57.5   Smokeless tobacco: Never  Vaping Use   Vaping status: Never Used  Substance and Sexual Activity   Alcohol use: Not Currently    Comment: 2 glasses wina a year   Drug use: No   Sexual activity: Not Currently    Birth control/protection: Post-menopausal  Other Topics Concern   Not on file  Social History Narrative   Not on file   Social Drivers of Health   Financial Resource Strain: Not on file  Food Insecurity: No Food Insecurity (04/30/2023)   Hunger Vital Sign    Worried About Running Out of Food in  the Last Year: Never true    Ran Out of Food in the Last Year: Never true  Transportation Needs: No Transportation Needs (04/30/2023)    PRAPARE - Administrator, Civil Service (Medical): No    Lack of Transportation (Non-Medical): No  Physical Activity: Not on file  Stress: Not on file  Social Connections: Not on file  Intimate Partner Violence: Not At Risk (04/30/2023)   Humiliation, Afraid, Rape, and Kick questionnaire    Fear of Current or Ex-Partner: No    Emotionally Abused: No    Physically Abused: No    Sexually Abused: No    Review of Systems:  All other review of systems negative except as mentioned in the HPI.  Physical Exam: Vital signs in last 24 hours: BP (!) 142/72   Pulse (!) 56   Temp (!) 97.3 F (36.3 C) (Temporal)   Ht 5\' 1"  (1.549 m)   Wt 141 lb (64 kg)   SpO2 96%   BMI 26.64 kg/m  General:   Alert, NAD Lungs:  Clear .   Heart:  Regular rate and rhythm Abdomen:  Soft, nontender and nondistended. Neuro/Psych:  Alert and cooperative. Normal mood and affect. A and O x 3  Reviewed labs, radiology imaging, old records and pertinent past GI work up  Patient is appropriate for planned procedure(s) and anesthesia in an ambulatory setting   K. Scherry Ran , MD [phone]

## 2024-02-19 ENCOUNTER — Telehealth: Payer: Self-pay | Admitting: *Deleted

## 2024-02-19 ENCOUNTER — Encounter: Payer: Self-pay | Admitting: Gastroenterology

## 2024-02-19 NOTE — Telephone Encounter (Signed)
  Follow up Call-     02/18/2024    1:54 PM 08/23/2021   12:50 PM  Call back number  Post procedure Call Back phone  # [phone]  Permission to leave phone message Yes Yes   Left voicemail to call back if she has questions or concerns

## 2024-02-21 LAB — SURGICAL PATHOLOGY

## 2024-03-04 ENCOUNTER — Ambulatory Visit (HOSPITAL_BASED_OUTPATIENT_CLINIC_OR_DEPARTMENT_OTHER): Payer: Medicare Other | Attending: Cardiology | Admitting: Cardiology

## 2024-03-04 VITALS — Ht 60.0 in | Wt 134.0 lb

## 2024-03-04 DIAGNOSIS — G4733 Obstructive sleep apnea (adult) (pediatric): Secondary | ICD-10-CM | POA: Insufficient documentation

## 2024-03-04 DIAGNOSIS — I10 Essential (primary) hypertension: Secondary | ICD-10-CM | POA: Insufficient documentation

## 2024-03-04 DIAGNOSIS — R400 Somnolence: Secondary | ICD-10-CM | POA: Diagnosis not present

## 2024-03-04 DIAGNOSIS — I2510 Atherosclerotic heart disease of native coronary artery without angina pectoris: Secondary | ICD-10-CM | POA: Insufficient documentation

## 2024-03-06 NOTE — Procedures (Signed)
     [facility] [address] [place], [place] [address] Tel: [phone]   Fax: [phone]  Titration Interpretation  Patient Name:  [name] [name], [name] [name]. Study Date:  03/04/2024 Referring Physician:  Dr. Armanda Magic  Indications for Polysomnography The patient is a 78 year old female  who is 5' and weighs 134.0 lbs. Her  BMI equals 26.3.  A full night titration treatment study was performed.  Medication were reported taken at 10:00 pm.  Albuterol HFA  Tylenol Extra Strength  Zyrtec   Polysomnogram Data A full night polysomnogram recorded the standard physiologic parameters including EEG, EOG, EMG, EKG, nasal and oral airflow.  Respiratory parameters of chest and abdominal movements were recorded with Respiratory Inductance Plethysmography belts.  Oxygen saturation was recorded by pulse oximetry.   Sleep Architecture The total recording time of the polysomnogram was 407.4 minutes.  The total sleep time was 201.0 minutes.  The patient spent 19.7% of total sleep time in Stage N1, 80.1% in Stage N2, 0.2% in Stages N3, and 0.0% in REM.  Sleep latency was 35.4 minutes.  REM latency was - minutes.  Sleep Efficiency was 49.3%.  Wake after Sleep Onset time was 171.0 minutes.  Titration Summary The patient was titrated at pressures ranging from 5 cm/H20 up to 17 cm/H20.  The last pressure used in the study was 17 cm/H20 .  Respiratory Events The polysomnogram revealed a presence of 2 obstructive, 6 central, and 0 mixed apneas resulting in an Apnea index of 2.4 events per hour.  There were 50 hypopneas (>=3% desaturation and/or arousal) resulting in an Apnea\Hypopnea Index (AHI >=3% desaturation and/or arousal) of 17.3 events per hour.  There were 30 hypopneas (>=4% desaturation) resulting in an Apnea\Hypopnea Index (AHI >=4% desaturation) of 11.3 events per hour.  There were 55 Respiratory Effort Related Arousals resulting in a RERA index of 16.4 events per  hour. The Respiratory Disturbance Index is 33.7 events per hour.  The snore index was 41.5 events per hour.  Mean oxygen saturation was 92.3%.  The lowest oxygen saturation during sleep was 83.0%.  Time spent <=88% oxygen saturation was 8.0 minutes (2.0%).  Limb Activity There were 82 limb movements recorded.  Of this total, 82 were classified as PLMs.  Of the PLMs, 6 were associated with arousals.  The Limb Movement index was 24.5 per hour while the PLM index was 24.5 per hour.  Cardiac Summary The average pulse rate was 50.4 bpm.  The minimum pulse rate was 44.0 bpm while the maximum pulse rate was 79.0 bpm.  Cardiac rhythm was Normal  Diagnosis:  Obstructive Sleep Apnea  Recommendations: Recommend a trial of ResMed CPAP at 17cm H2O with heated humidity, EPR of 3 and small Resmed Airfit F-10 The patient should be counseled in good sleep hygiene and avoid sleeping supine. The patient should be encouraged to avoid driving when sleepy. Followup in Sleep Medicine clinic in 6 weeks.    This study was personally reviewed and electronically signed by: Dr. Armanda Magic Accredited Board Certified in Sleep Medicine Date/Time: 03/06/2024 1:13PM

## 2024-03-10 ENCOUNTER — Telehealth: Payer: Self-pay | Admitting: *Deleted

## 2024-03-10 DIAGNOSIS — G4733 Obstructive sleep apnea (adult) (pediatric): Secondary | ICD-10-CM

## 2024-03-10 DIAGNOSIS — R400 Somnolence: Secondary | ICD-10-CM

## 2024-03-10 NOTE — Telephone Encounter (Signed)
-----   Message from Armanda Magic sent at 03/06/2024  1:14 PM EDT ----- Please let patient know that they had a successful PAP titration and let DME know that orders are in EPIC.  Please set up 6 week OV with me.

## 2024-03-10 NOTE — Telephone Encounter (Signed)
 The patient has been notified of the result via voicemail. Left message to call back. Latrelle Dodrill, CMA 03/10/2024 6:57 PM

## 2024-03-12 NOTE — Telephone Encounter (Signed)
 RETURN CALL: The patient has been notified of the result and verbalized understanding.  All questions (if any) were answered. [name] [name], CMA 03/12/2024 1:33 PM     Upon patient request DME selection is [facility] Patient understands he will be contacted by [facility] to set up his cpap. Patient understands to call if [facility] does not contact him with new setup in a timely manner. Patient understands they will be called once confirmation has been received from [place] that they have received their new machine to schedule 10 week follow up appointment.   [facility] notified of new cpap order  Please add to airview Patient was grateful for the call and thanked me

## 2024-03-24 DIAGNOSIS — J320 Chronic maxillary sinusitis: Secondary | ICD-10-CM | POA: Diagnosis not present

## 2024-04-01 ENCOUNTER — Encounter (HOSPITAL_BASED_OUTPATIENT_CLINIC_OR_DEPARTMENT_OTHER): Payer: Self-pay

## 2024-04-01 DIAGNOSIS — R400 Somnolence: Secondary | ICD-10-CM

## 2024-04-01 DIAGNOSIS — I10 Essential (primary) hypertension: Secondary | ICD-10-CM

## 2024-04-01 DIAGNOSIS — G4733 Obstructive sleep apnea (adult) (pediatric): Secondary | ICD-10-CM

## 2024-04-03 DIAGNOSIS — E782 Mixed hyperlipidemia: Secondary | ICD-10-CM | POA: Diagnosis not present

## 2024-04-03 DIAGNOSIS — H0260 Xanthelasma of unspecified eye, unspecified eyelid: Secondary | ICD-10-CM | POA: Diagnosis not present

## 2024-04-03 DIAGNOSIS — R7989 Other specified abnormal findings of blood chemistry: Secondary | ICD-10-CM | POA: Diagnosis not present

## 2024-04-08 DIAGNOSIS — E782 Mixed hyperlipidemia: Secondary | ICD-10-CM | POA: Diagnosis not present

## 2024-04-08 DIAGNOSIS — K219 Gastro-esophageal reflux disease without esophagitis: Secondary | ICD-10-CM | POA: Diagnosis not present

## 2024-04-08 DIAGNOSIS — M810 Age-related osteoporosis without current pathological fracture: Secondary | ICD-10-CM | POA: Diagnosis not present

## 2024-04-08 DIAGNOSIS — J4540 Moderate persistent asthma, uncomplicated: Secondary | ICD-10-CM | POA: Diagnosis not present

## 2024-04-08 DIAGNOSIS — N1832 Chronic kidney disease, stage 3b: Secondary | ICD-10-CM | POA: Diagnosis not present

## 2024-04-08 DIAGNOSIS — E538 Deficiency of other specified B group vitamins: Secondary | ICD-10-CM | POA: Diagnosis not present

## 2024-04-08 DIAGNOSIS — G4733 Obstructive sleep apnea (adult) (pediatric): Secondary | ICD-10-CM | POA: Diagnosis not present

## 2024-04-08 DIAGNOSIS — I2510 Atherosclerotic heart disease of native coronary artery without angina pectoris: Secondary | ICD-10-CM | POA: Diagnosis not present

## 2024-04-08 DIAGNOSIS — I10 Essential (primary) hypertension: Secondary | ICD-10-CM | POA: Diagnosis not present

## 2024-04-13 ENCOUNTER — Encounter: Payer: Self-pay | Admitting: Gastroenterology

## 2024-04-24 NOTE — Telephone Encounter (Signed)
 Pt following up

## 2024-04-28 NOTE — Telephone Encounter (Signed)
 Per Dr Micael Adas, please order ResMed AirFit P30 I nasal mask with a chinstrap and then repeat a download in 4 weeks. Patient is aware.

## 2024-04-28 NOTE — Addendum Note (Signed)
 Addended by: Joslyn Nim on: 04/28/2024 01:29 PM   Modules accepted: Orders

## 2024-05-02 ENCOUNTER — Other Ambulatory Visit: Payer: Self-pay | Admitting: Cardiovascular Disease

## 2024-05-04 NOTE — Telephone Encounter (Signed)
 Please review and advise.

## 2024-05-19 DIAGNOSIS — N390 Urinary tract infection, site not specified: Secondary | ICD-10-CM | POA: Diagnosis not present

## 2024-05-21 NOTE — Telephone Encounter (Signed)
 FYI

## 2024-06-21 DIAGNOSIS — N390 Urinary tract infection, site not specified: Secondary | ICD-10-CM | POA: Diagnosis not present

## 2024-07-06 DIAGNOSIS — N390 Urinary tract infection, site not specified: Secondary | ICD-10-CM | POA: Diagnosis not present

## 2024-07-09 DIAGNOSIS — N1832 Chronic kidney disease, stage 3b: Secondary | ICD-10-CM | POA: Diagnosis not present

## 2024-07-09 DIAGNOSIS — E538 Deficiency of other specified B group vitamins: Secondary | ICD-10-CM | POA: Diagnosis not present

## 2024-08-04 ENCOUNTER — Other Ambulatory Visit: Payer: Self-pay

## 2024-08-04 DIAGNOSIS — I2510 Atherosclerotic heart disease of native coronary artery without angina pectoris: Secondary | ICD-10-CM

## 2024-08-04 DIAGNOSIS — I10 Essential (primary) hypertension: Secondary | ICD-10-CM

## 2024-08-04 DIAGNOSIS — E876 Hypokalemia: Secondary | ICD-10-CM

## 2024-08-04 MED ORDER — POTASSIUM CHLORIDE CRYS ER 20 MEQ PO TBCR: 20.0000 meq | EXTENDED_RELEASE_TABLET | Freq: Every day | ORAL | 0 refills | Status: DC

## 2024-08-07 DIAGNOSIS — M810 Age-related osteoporosis without current pathological fracture: Secondary | ICD-10-CM | POA: Diagnosis not present

## 2024-08-11 DIAGNOSIS — R350 Frequency of micturition: Secondary | ICD-10-CM | POA: Diagnosis not present

## 2024-08-11 DIAGNOSIS — N3020 Other chronic cystitis without hematuria: Secondary | ICD-10-CM | POA: Diagnosis not present

## 2024-08-21 NOTE — Progress Notes (Signed)
 Cardiology Office Note:   Date:  08/25/2024  ID:  [name] [name], DOB 18-Nov-1946, MRN [id number] PCP:  Aisha Harvey, MD  [facility] Providers Cardiologist:  Wendel Haws, MD Referring MD: Aisha Harvey, MD  Chief Complaint/Reason for Referral: Follow-up coronary artery disease ASSESSMENT:    1. Acute coronary syndrome (HCC)   2. Ischemic cardiomyopathy   3. Hyperlipidemia LDL goal <55   4. Aortic atherosclerosis   5. Essential hypertension   6. Stage 3a chronic kidney disease (HCC)     PLAN:   In order of problems listed above: Acute coronary syndrome status post PCI: Continue Plavix  75, rosuvastatin  5, Nebivolol  5 Ischemic cardiomyopathy: Continue irbesartan  150, Nebivolol  5, spironolactone  25; check TTE. Hyperlipidemia: Continue rosuvastatin  5.  LDL was 46 in May of this year when checked by PCP.  Defer LP(a) in this advanced age patient Aortic atherosclerosis: Continue Plavix  75, rosuvastatin  5 Hypertension: Continue irbesartan  150, Nebivolol  5, spironolactone  25, amlodipine  2.5.  BP is well-controlled today CKD stage IIIa: Continue irbesartan  150.  Patient would like to avoid taking potassium pill which is inconvenient for her.  Her last potassium in August of this year was 4.9.  She is on potassium sparing diuretic and irbesartan .  Will stop potassium and check a BMP in 1 week.  If potassium within normal limits we will continue off potassium.            Dispo:  Return in about 6 months (around 02/22/2025).       I spent 34 minutes reviewing all clinical data during and prior to this visit including all relevant imaging studies, laboratories, clinical information from other health systems and prior notes from both Cardiology and other specialties, interviewing the patient, conducting a complete physical examination, and coordinating care in order to formulate a comprehensive and personalized evaluation and treatment plan.   History of Present Illness:     FOCUSED PROBLEM LIST:   Coronary artery disease NSTEMI in 07/2003 s/p 3 x 20 mm Taxus DES to LAD Myoview  in 2012: low risk  Ischemic CM Echo 07/2003: EF 30, apical and infero apical AK Echo 7/15: EF 60-65 Hyperlipidemia  Aortic atherosclerosis CT abdomen pelvis 2022 Hypertension  CKD stage IIIa Asthma  Hx of Breast CA  S/p mastectomy withou XRT 1990s BMI 27 August 2024:  Patient consents to use of AI scribe. The patient returns for routine follow-up.  She was last evaluated in March 2025.  At that point in time she was doing well without cardiovascular issues.  She continues to do well.  She denies any cardiovascular symptomatology.  Her biggest issue is her difficulty with her potassium pill.  She was started on it after discharge from the hospital.  Her last potassium level last month was 4.9.  She is also on spironolactone  and irbesartan .  She would like to try to get off of this pill.  She is otherwise well without significant complaints today.     Current Medications: Current Meds  Medication Sig   acetaminophen  (TYLENOL ) 500 MG tablet Take 500-1,000 mg by mouth every 6 (six) hours as needed for mild pain or headache.   albuterol  (VENTOLIN  HFA) 108 (90 Base) MCG/ACT inhaler Inhale 2 puffs into the lungs every 4 (four) hours as needed for shortness of breath.   amLODipine  (NORVASC ) 2.5 MG tablet Take 2.5 mg by mouth daily.   Calcium  Carbonate (CALCIUM  600 PO) Take 600 mg by mouth daily.   clopidogrel  (PLAVIX ) 75 MG tablet TAKE ONE TABLET  BY MOUTH ONCE DAILY   conjugated estrogens  (PREMARIN ) vaginal cream Place 0.5 Applicatorfuls vaginally 2 (two) times a week. On Tuesday and Saturday     Use as directed   denosumab  (PROLIA ) 60 MG/ML SOSY injection Inject 60 mg into the skin every 6 (six) months.   docusate sodium  (COLACE) 50 MG capsule Take 50 mg by mouth daily as needed for mild constipation.   escitalopram  (LEXAPRO ) 10 MG tablet Take 10 mg by mouth daily.   fluticasone   (FLONASE ) 50 MCG/ACT nasal spray Place 1 spray into both nostrils 2 (two) times daily.   Fluticasone  Furoate (ARNUITY ELLIPTA ) 200 MCG/ACT AEPB Inhale 1 puff into the lungs daily.   irbesartan  (AVAPRO ) 150 MG tablet Take 1 tablet (150 mg total) by mouth daily.   ketotifen  (ZADITOR ) 0.025 % ophthalmic solution Place 1 drop into both eyes 2 (two) times daily.   loratadine  (CLARITIN ) 10 MG tablet Take 10 mg by mouth daily.   metroNIDAZOLE (METROGEL) 0.75 % gel Apply 1 Application topically daily.   nebivolol  (BYSTOLIC ) 5 MG tablet Take 1 tablet (5 mg total) by mouth daily.   polyvinyl alcohol  (LIQUIFILM TEARS) 1.4 % ophthalmic solution Place 1 drop into both eyes 2 (two) times daily.   rosuvastatin  (CRESTOR ) 5 MG tablet Take 1 tablet (5 mg total) by mouth daily.   Simethicone (GAS-X MAXIMUM STRENGTH PO) Take 250 mg by mouth daily as needed (gas).   spironolactone  (ALDACTONE ) 25 MG tablet Take 1 tablet (25 mg total) by mouth daily.   [DISCONTINUED] potassium chloride  SA (KLOR-CON  M) 20 MEQ tablet Take 1 tablet (20 mEq total) by mouth daily.     Review of Systems:   Please see the history of present illness.    All other systems reviewed and are negative.     EKGs/Labs/Other Test Reviewed:   EKG: 2003 normal sinus rhythm with occasional PVCs, nonspecific ST and T wave changes  EKG Interpretation Date/Time:  Tuesday August 25 2024 14:06:12 EDT Ventricular Rate:  55 PR Interval:  212 QRS Duration:  72 QT Interval:  412 QTC Calculation: 394 R Axis:   -30  Text Interpretation: Sinus bradycardia with sinus arrhythmia with 1st degree A-V block Left axis deviation Nonspecific T wave abnormality When compared with ECG of 18-Sep-2022 10:39, Premature ventricular complexes are no longer Present QRS axis Shifted left Inverted T waves have replaced nonspecific T wave abnormality in Anterior leads Nonspecific T wave abnormality has replaced inverted T waves in Lateral leads QT has shortened Confirmed  by Wendel Haws ([id number]) on 08/25/2024 2:09:58 PM        CARDIAC STUDIES: Refer to CV Procedures and Imaging Tabs   Risk Assessment/Calculations:     STOP-Bang Score:          Physical Exam:   VS:  BP 114/66 (BP Location: Right Arm, Patient Position: Sitting, Cuff Size: Normal)   Pulse (!) 55   Ht 5' 1 (1.549 m)   Wt 135 lb 3.2 oz (61.3 kg)   SpO2 95%   BMI 25.55 kg/m        Wt Readings from Last 3 Encounters:  08/25/24 135 lb 3.2 oz (61.3 kg)  03/04/24 134 lb (60.8 kg)  02/18/24 141 lb (64 kg)      GENERAL:  No apparent distress, AOx3 HEENT:  No carotid bruits, +2 carotid impulses, no scleral icterus CAR: RRR no murmur, gallops, rubs, or thrills RES:  Clear to auscultation bilaterally ABD:  Soft, nontender, nondistended, positive bowel sounds x 4  VASC:  +2 radial pulses, +2 carotid pulses NEURO:  CN 2-12 grossly intact; motor and sensory grossly intact PSYCH:  No active depression or anxiety EXT:  No edema, ecchymosis, or cyanosis  Signed, Max Nuno K Davetta Olliff, MD  08/25/2024 2:28 PM    [facility] [address] [place], [place], [place]  [address] Phone: [phone]; Fax: [phone]   Note:  This document was prepared using Dragon voice recognition software and may include unintentional dictation errors.

## 2024-08-25 ENCOUNTER — Ambulatory Visit: Attending: Internal Medicine | Admitting: Internal Medicine

## 2024-08-25 ENCOUNTER — Encounter: Payer: Self-pay | Admitting: Internal Medicine

## 2024-08-25 VITALS — BP 114/66 | HR 55 | Ht 61.0 in | Wt 135.2 lb

## 2024-08-25 DIAGNOSIS — I255 Ischemic cardiomyopathy: Secondary | ICD-10-CM | POA: Diagnosis not present

## 2024-08-25 DIAGNOSIS — E785 Hyperlipidemia, unspecified: Secondary | ICD-10-CM

## 2024-08-25 DIAGNOSIS — N1831 Chronic kidney disease, stage 3a: Secondary | ICD-10-CM

## 2024-08-25 DIAGNOSIS — I700 Atherosclerosis of aorta: Secondary | ICD-10-CM

## 2024-08-25 DIAGNOSIS — I249 Acute ischemic heart disease, unspecified: Secondary | ICD-10-CM | POA: Diagnosis not present

## 2024-08-25 DIAGNOSIS — I10 Essential (primary) hypertension: Secondary | ICD-10-CM | POA: Diagnosis not present

## 2024-08-25 NOTE — Patient Instructions (Signed)
 Medication Instructions:  STOP Potassium   *If you need a refill on your cardiac medications before your next appointment, please call your pharmacy*  Lab Work: To be completed in 1 week: BMP  If you have labs (blood work) drawn today and your tests are completely normal, you will receive your results only by: MyChart Message (if you have MyChart) OR A paper copy in the mail If you have any lab test that is abnormal or we need to change your treatment, we will call you to review the results.  Testing/Procedures: Your physician has requested that you have an echocardiogram. Echocardiography is a painless test that uses sound waves to create images of your heart. It provides your doctor with information about the size and shape of your heart and how well your heart's chambers and valves are working. This procedure takes approximately one hour. There are no restrictions for this procedure. Please do NOT wear cologne, perfume, aftershave, or lotions (deodorant is allowed). Please arrive 15 minutes prior to your appointment time.  Please note: We ask at that you not bring children with you during ultrasound (echo/ vascular) testing. Due to room size and safety concerns, children are not allowed in the ultrasound rooms during exams. Our front office staff cannot provide observation of children in our lobby area while testing is being conducted. An adult accompanying a patient to their appointment will only be allowed in the ultrasound room at the discretion of the ultrasound technician under special circumstances. We apologize for any inconvenience.   Follow-Up: At [facility], you and your health needs are our priority.  As part of our continuing mission to provide you with exceptional heart care, our providers are all part of one team.  This team includes your primary Cardiologist (physician) and Advanced Practice Providers or APPs (Physician Assistants and Nurse Practitioners) who all  work together to provide you with the care you need, when you need it.  Your next appointment:   6 month(s)  Provider:   Arun Thukkani, MD

## 2024-09-02 DIAGNOSIS — I10 Essential (primary) hypertension: Secondary | ICD-10-CM | POA: Diagnosis not present

## 2024-09-03 ENCOUNTER — Ambulatory Visit: Payer: Self-pay | Admitting: Internal Medicine

## 2024-09-03 ENCOUNTER — Encounter: Payer: Self-pay | Admitting: Internal Medicine

## 2024-09-03 DIAGNOSIS — E875 Hyperkalemia: Secondary | ICD-10-CM

## 2024-09-03 LAB — BASIC METABOLIC PANEL WITH GFR
BUN/Creatinine Ratio: 17 (ref 12–28)
BUN: 28 mg/dL — ABNORMAL HIGH (ref 8–27)
CO2: 18 mmol/L — ABNORMAL LOW (ref 20–29)
Calcium: 8.9 mg/dL (ref 8.7–10.3)
Chloride: 102 mmol/L (ref 96–106)
Creatinine, Ser: 1.69 mg/dL — ABNORMAL HIGH (ref 0.57–1.00)
Glucose: 71 mg/dL (ref 70–99)
Potassium: 5 mmol/L (ref 3.5–5.2)
Sodium: 135 mmol/L (ref 134–144)
eGFR: 31 mL/min/1.73 — ABNORMAL LOW (ref >59)

## 2024-09-08 DIAGNOSIS — E875 Hyperkalemia: Secondary | ICD-10-CM | POA: Diagnosis not present

## 2024-09-09 LAB — BASIC METABOLIC PANEL WITH GFR
BUN/Creatinine Ratio: 14 (ref 12–28)
BUN: 23 mg/dL (ref 8–27)
CO2: 20 mmol/L (ref 20–29)
Calcium: 8.7 mg/dL (ref 8.7–10.3)
Chloride: 100 mmol/L (ref 96–106)
Creatinine, Ser: 1.68 mg/dL — ABNORMAL HIGH (ref 0.57–1.00)
Glucose: 70 mg/dL (ref 70–99)
Potassium: 5.1 mmol/L (ref 3.5–5.2)
Sodium: 133 mmol/L — ABNORMAL LOW (ref 134–144)
eGFR: 31 mL/min/1.73 — ABNORMAL LOW (ref >59)

## 2024-09-14 ENCOUNTER — Other Ambulatory Visit: Payer: Self-pay

## 2024-09-14 DIAGNOSIS — E876 Hypokalemia: Secondary | ICD-10-CM

## 2024-09-14 DIAGNOSIS — I2510 Atherosclerotic heart disease of native coronary artery without angina pectoris: Secondary | ICD-10-CM

## 2024-09-14 DIAGNOSIS — I10 Essential (primary) hypertension: Secondary | ICD-10-CM

## 2024-09-16 MED ORDER — SPIRONOLACTONE 25 MG PO TABS: 25.0000 mg | ORAL_TABLET | Freq: Every day | ORAL | 3 refills | Status: AC

## 2024-09-22 ENCOUNTER — Other Ambulatory Visit: Payer: Self-pay

## 2024-09-22 DIAGNOSIS — N3281 Overactive bladder: Secondary | ICD-10-CM | POA: Diagnosis not present

## 2024-09-22 DIAGNOSIS — N3020 Other chronic cystitis without hematuria: Secondary | ICD-10-CM | POA: Diagnosis not present

## 2024-09-25 MED ORDER — NEBIVOLOL HCL 5 MG PO TABS: 5.0000 mg | ORAL_TABLET | Freq: Every day | ORAL | 3 refills | Status: AC

## 2024-10-02 ENCOUNTER — Ambulatory Visit (HOSPITAL_COMMUNITY)
Admission: RE | Admit: 2024-10-02 | Discharge: 2024-10-02 | Disposition: A | Discharge status: Home / Self Care | Source: Ambulatory Visit | Attending: Internal Medicine | Admitting: Internal Medicine

## 2024-10-02 DIAGNOSIS — I700 Atherosclerosis of aorta: Secondary | ICD-10-CM

## 2024-10-02 DIAGNOSIS — I255 Ischemic cardiomyopathy: Secondary | ICD-10-CM | POA: Diagnosis present

## 2024-10-02 LAB — ECHOCARDIOGRAM COMPLETE
Area-P 1/2: 4.63 cm2
S' Lateral: 1.55 cm

## 2025-03-09 ENCOUNTER — Ambulatory Visit: Admitting: Physician Assistant
